# Patient Record
Sex: Male | Born: 1955 | Race: Black or African American | Hispanic: No | Marital: Single | Smoking: Former smoker
Health system: Southern US, Community
[De-identification: ages and names within clinical notes are randomized; demographics above are authoritative.]

## PROBLEM LIST (undated history)

## (undated) DIAGNOSIS — E119 Type 2 diabetes mellitus without complications: Secondary | ICD-10-CM

## (undated) DIAGNOSIS — I10 Essential (primary) hypertension: Secondary | ICD-10-CM

## (undated) DIAGNOSIS — Z930 Tracheostomy status: Secondary | ICD-10-CM

## (undated) DIAGNOSIS — H409 Unspecified glaucoma: Secondary | ICD-10-CM

## (undated) DIAGNOSIS — E785 Hyperlipidemia, unspecified: Secondary | ICD-10-CM

## (undated) HISTORY — DX: Unspecified glaucoma: H40.9

## (undated) HISTORY — PX: FOOT SURGERY: SHX648

## (undated) HISTORY — PX: EYE SURGERY: SHX253

## (undated) HISTORY — PX: TRACHEOSTOMY: SUR1362

## (undated) HISTORY — DX: Hyperlipidemia, unspecified: E78.5

## (undated) HISTORY — PX: HAND SURGERY: SHX662

---

## 2007-02-11 ENCOUNTER — Ambulatory Visit (HOSPITAL_COMMUNITY): Admission: RE | Admit: 2007-02-11 | Discharge: 2007-02-14 | Payer: Self-pay | Admitting: Cardiology

## 2007-10-05 ENCOUNTER — Emergency Department (HOSPITAL_COMMUNITY): Admission: EM | Admit: 2007-10-05 | Discharge: 2007-10-05 | Payer: Self-pay | Admitting: Emergency Medicine

## 2009-07-16 ENCOUNTER — Emergency Department (HOSPITAL_COMMUNITY): Admission: EM | Admit: 2009-07-16 | Discharge: 2009-07-16 | Payer: Self-pay | Admitting: Emergency Medicine

## 2010-03-30 LAB — GLUCOSE, CAPILLARY: Glucose-Capillary: 158 mg/dL — ABNORMAL HIGH (ref 70–99)

## 2011-02-24 ENCOUNTER — Encounter (INDEPENDENT_AMBULATORY_CARE_PROVIDER_SITE_OTHER): Payer: Self-pay | Admitting: Ophthalmology

## 2011-02-25 ENCOUNTER — Encounter (INDEPENDENT_AMBULATORY_CARE_PROVIDER_SITE_OTHER): Payer: 59 | Admitting: Ophthalmology

## 2011-02-25 DIAGNOSIS — E11319 Type 2 diabetes mellitus with unspecified diabetic retinopathy without macular edema: Secondary | ICD-10-CM

## 2011-02-25 DIAGNOSIS — I10 Essential (primary) hypertension: Secondary | ICD-10-CM

## 2011-02-25 DIAGNOSIS — H35039 Hypertensive retinopathy, unspecified eye: Secondary | ICD-10-CM

## 2011-02-25 DIAGNOSIS — E1139 Type 2 diabetes mellitus with other diabetic ophthalmic complication: Secondary | ICD-10-CM

## 2011-02-25 DIAGNOSIS — H3581 Retinal edema: Secondary | ICD-10-CM

## 2011-02-25 DIAGNOSIS — H43819 Vitreous degeneration, unspecified eye: Secondary | ICD-10-CM

## 2011-02-25 DIAGNOSIS — E1165 Type 2 diabetes mellitus with hyperglycemia: Secondary | ICD-10-CM

## 2011-03-05 ENCOUNTER — Other Ambulatory Visit (INDEPENDENT_AMBULATORY_CARE_PROVIDER_SITE_OTHER): Payer: 59 | Admitting: Ophthalmology

## 2011-03-05 DIAGNOSIS — H3581 Retinal edema: Secondary | ICD-10-CM

## 2011-04-10 ENCOUNTER — Other Ambulatory Visit: Payer: Self-pay | Admitting: Cardiology

## 2011-07-03 ENCOUNTER — Ambulatory Visit (INDEPENDENT_AMBULATORY_CARE_PROVIDER_SITE_OTHER): Payer: 59 | Admitting: Ophthalmology

## 2012-01-15 ENCOUNTER — Encounter (INDEPENDENT_AMBULATORY_CARE_PROVIDER_SITE_OTHER): Payer: 59 | Admitting: Ophthalmology

## 2012-01-26 ENCOUNTER — Encounter (INDEPENDENT_AMBULATORY_CARE_PROVIDER_SITE_OTHER): Payer: BC Managed Care – PPO | Admitting: Ophthalmology

## 2012-01-26 DIAGNOSIS — E11319 Type 2 diabetes mellitus with unspecified diabetic retinopathy without macular edema: Secondary | ICD-10-CM

## 2012-01-26 DIAGNOSIS — H35039 Hypertensive retinopathy, unspecified eye: Secondary | ICD-10-CM

## 2012-01-26 DIAGNOSIS — H4010X0 Unspecified open-angle glaucoma, stage unspecified: Secondary | ICD-10-CM

## 2012-01-26 DIAGNOSIS — I10 Essential (primary) hypertension: Secondary | ICD-10-CM

## 2012-01-26 DIAGNOSIS — H43819 Vitreous degeneration, unspecified eye: Secondary | ICD-10-CM

## 2012-01-26 DIAGNOSIS — E1139 Type 2 diabetes mellitus with other diabetic ophthalmic complication: Secondary | ICD-10-CM

## 2012-01-26 DIAGNOSIS — E1165 Type 2 diabetes mellitus with hyperglycemia: Secondary | ICD-10-CM

## 2012-04-15 ENCOUNTER — Other Ambulatory Visit (HOSPITAL_COMMUNITY): Payer: Self-pay | Admitting: Cardiology

## 2012-04-15 DIAGNOSIS — I63239 Cerebral infarction due to unspecified occlusion or stenosis of unspecified carotid arteries: Secondary | ICD-10-CM

## 2012-04-22 ENCOUNTER — Ambulatory Visit (HOSPITAL_COMMUNITY)
Admission: RE | Admit: 2012-04-22 | Discharge: 2012-04-22 | Disposition: A | Discharge status: Home / Self Care | Payer: BC Managed Care – PPO | Source: Ambulatory Visit | Attending: Cardiology | Admitting: Cardiology

## 2012-04-22 DIAGNOSIS — H3500 Unspecified background retinopathy: Secondary | ICD-10-CM | POA: Insufficient documentation

## 2012-04-22 DIAGNOSIS — I6529 Occlusion and stenosis of unspecified carotid artery: Secondary | ICD-10-CM

## 2012-04-22 DIAGNOSIS — I63239 Cerebral infarction due to unspecified occlusion or stenosis of unspecified carotid arteries: Secondary | ICD-10-CM

## 2012-04-22 NOTE — Progress Notes (Signed)
*  PRELIMINARY RESULTS* Vascular Ultrasound Carotid Duplex (Doppler) has been completed.  Preliminary findings: Bilaterally no significant ICA stenosis. Minimal plaque noted. Antegrade vertebral flow.   Farrel Demark, RDMS, RVT  04/22/2012, 10:55 AM

## 2012-07-25 ENCOUNTER — Ambulatory Visit (INDEPENDENT_AMBULATORY_CARE_PROVIDER_SITE_OTHER): Payer: BC Managed Care – PPO | Admitting: Ophthalmology

## 2012-07-29 ENCOUNTER — Ambulatory Visit (INDEPENDENT_AMBULATORY_CARE_PROVIDER_SITE_OTHER): Payer: Self-pay | Admitting: Ophthalmology

## 2012-08-01 ENCOUNTER — Ambulatory Visit (INDEPENDENT_AMBULATORY_CARE_PROVIDER_SITE_OTHER): Payer: BC Managed Care – PPO | Admitting: Ophthalmology

## 2012-08-01 DIAGNOSIS — E1165 Type 2 diabetes mellitus with hyperglycemia: Secondary | ICD-10-CM

## 2012-08-01 DIAGNOSIS — H43819 Vitreous degeneration, unspecified eye: Secondary | ICD-10-CM

## 2012-08-01 DIAGNOSIS — H35039 Hypertensive retinopathy, unspecified eye: Secondary | ICD-10-CM

## 2012-08-01 DIAGNOSIS — E11319 Type 2 diabetes mellitus with unspecified diabetic retinopathy without macular edema: Secondary | ICD-10-CM

## 2012-08-01 DIAGNOSIS — H2510 Age-related nuclear cataract, unspecified eye: Secondary | ICD-10-CM

## 2012-08-01 DIAGNOSIS — E1139 Type 2 diabetes mellitus with other diabetic ophthalmic complication: Secondary | ICD-10-CM

## 2012-08-01 DIAGNOSIS — I10 Essential (primary) hypertension: Secondary | ICD-10-CM

## 2012-10-03 ENCOUNTER — Emergency Department (HOSPITAL_COMMUNITY): Payer: BC Managed Care – PPO

## 2012-10-03 ENCOUNTER — Encounter (HOSPITAL_COMMUNITY): Payer: Self-pay | Admitting: *Deleted

## 2012-10-03 ENCOUNTER — Encounter (INDEPENDENT_AMBULATORY_CARE_PROVIDER_SITE_OTHER): Payer: BC Managed Care – PPO | Admitting: Ophthalmology

## 2012-10-03 ENCOUNTER — Emergency Department (HOSPITAL_COMMUNITY)
Admission: EM | Admit: 2012-10-03 | Discharge: 2012-10-03 | Disposition: A | Discharge status: Home / Self Care | Payer: BC Managed Care – PPO | Attending: Emergency Medicine | Admitting: Emergency Medicine

## 2012-10-03 DIAGNOSIS — Y990 Civilian activity done for income or pay: Secondary | ICD-10-CM | POA: Insufficient documentation

## 2012-10-03 DIAGNOSIS — I10 Essential (primary) hypertension: Secondary | ICD-10-CM | POA: Insufficient documentation

## 2012-10-03 DIAGNOSIS — Y929 Unspecified place or not applicable: Secondary | ICD-10-CM | POA: Insufficient documentation

## 2012-10-03 DIAGNOSIS — Z87891 Personal history of nicotine dependence: Secondary | ICD-10-CM | POA: Insufficient documentation

## 2012-10-03 DIAGNOSIS — W010XXA Fall on same level from slipping, tripping and stumbling without subsequent striking against object, initial encounter: Secondary | ICD-10-CM | POA: Insufficient documentation

## 2012-10-03 DIAGNOSIS — S4980XA Other specified injuries of shoulder and upper arm, unspecified arm, initial encounter: Secondary | ICD-10-CM | POA: Insufficient documentation

## 2012-10-03 DIAGNOSIS — S46909A Unspecified injury of unspecified muscle, fascia and tendon at shoulder and upper arm level, unspecified arm, initial encounter: Secondary | ICD-10-CM | POA: Insufficient documentation

## 2012-10-03 DIAGNOSIS — S0003XA Contusion of scalp, initial encounter: Secondary | ICD-10-CM | POA: Insufficient documentation

## 2012-10-03 DIAGNOSIS — Z79899 Other long term (current) drug therapy: Secondary | ICD-10-CM | POA: Insufficient documentation

## 2012-10-03 DIAGNOSIS — E119 Type 2 diabetes mellitus without complications: Secondary | ICD-10-CM | POA: Insufficient documentation

## 2012-10-03 DIAGNOSIS — Y939 Activity, unspecified: Secondary | ICD-10-CM | POA: Insufficient documentation

## 2012-10-03 DIAGNOSIS — Z23 Encounter for immunization: Secondary | ICD-10-CM | POA: Insufficient documentation

## 2012-10-03 HISTORY — DX: Type 2 diabetes mellitus without complications: E11.9

## 2012-10-03 HISTORY — DX: Essential (primary) hypertension: I10

## 2012-10-03 MED ORDER — TRAMADOL HCL 50 MG PO TABS: 50.0000 mg | ORAL_TABLET | Freq: Four times a day (QID) | ORAL | Status: DC | PRN

## 2012-10-03 MED ORDER — BACITRACIN ZINC 500 UNIT/GM EX OINT
TOPICAL_OINTMENT | Freq: Two times a day (BID) | CUTANEOUS | Status: DC
  Administered 2012-10-03: 1 via TOPICAL

## 2012-10-03 MED ORDER — TETANUS-DIPHTH-ACELL PERTUSSIS 5-2.5-18.5 LF-MCG/0.5 IM SUSP
0.5000 mL | Freq: Once | INTRAMUSCULAR | Status: AC
  Administered 2012-10-03: 0.5 mL via INTRAMUSCULAR
  Filled 2012-10-03: qty 0.5

## 2012-10-03 MED ORDER — HYDROCODONE-ACETAMINOPHEN 5-325 MG PO TABS
2.0000 | ORAL_TABLET | Freq: Once | ORAL | Status: AC
  Administered 2012-10-03: 2 via ORAL
  Filled 2012-10-03: qty 2

## 2012-10-03 NOTE — ED Notes (Signed)
Per EMS pt tripped and fell on concrete hitting his left arm. Per EMS pt c/o left arm pain and numbness. Pt is on LSB., VSS.

## 2012-10-03 NOTE — ED Notes (Signed)
Pt requesting to speak with MD prior to discharge, dr Denton Lank notified

## 2012-10-03 NOTE — ED Provider Notes (Addendum)
CSN: [id number]     Arrival date & time 10/03/12  1142 History   First MD Initiated Contact with Patient 10/03/12 1212     Chief Complaint  Patient presents with  . Fall  . Arm Pain   (Consider location/radiation/quality/duration/timing/severity/associated sxs/prior Treatment) Patient is a 57 y.o. male presenting with fall and arm pain. The history is provided by the patient.  Fall Pertinent negatives include no chest pain, no abdominal pain and no shortness of breath.  Arm Pain Pertinent negatives include no chest pain, no abdominal pain and no shortness of breath.  pt indicates trip and fall at work just pta today. States his work boots caught on edge metal grate, fell forward. C/o contusion head/forehead. Was dazed, denies loc. Dull frontal headache post fall.  Denies any faintness, dizziness or headache pre-fall. Also c/o left shoulder pain, constant, dull, moderate. States had brief tingly feeling to left shoulder/upper arm. No loss of sensation or weakness. Denies neck or back pain. Denies other pain or injury. States prior to fall, recent health at baseline, was otherwise feeling normal/well prior to fall.     Past Medical History  Diagnosis Date  . Hypertension   . Diabetes mellitus without complication    Past Surgical History  Procedure Laterality Date  . Eye surgery     History reviewed. No pertinent family history. History  Substance Use Topics  . Smoking status: Former Smoker    Quit date: 01/13/2012  . Smokeless tobacco: Not on file  . Alcohol Use: Yes    Review of Systems  Constitutional: Negative for fever.  HENT: Negative for neck pain.   Eyes: Negative for pain and visual disturbance.  Respiratory: Negative for shortness of breath.   Cardiovascular: Negative for chest pain.  Gastrointestinal: Negative for vomiting and abdominal pain.  Genitourinary: Negative for flank pain.  Musculoskeletal: Negative for back pain.  Skin: Negative for rash.   Neurological: Negative for weakness.  Hematological: Does not bruise/bleed easily.  Psychiatric/Behavioral: Negative for confusion.    Allergies  Review of patient's allergies indicates no known allergies.  Home Medications   Current Outpatient Rx  Name  Route  Sig  Dispense  Refill  . brinzolamide (AZOPT) 1 % ophthalmic suspension   Both Eyes   Place 1 drop into both eyes 3 (three) times daily.         . furosemide (LASIX) 40 MG tablet   Oral   Take 40 mg by mouth daily as needed for fluid.         Marland Kitchen losartan (COZAAR) 100 MG tablet   Oral   Take 100 mg by mouth every morning.         . meloxicam (MOBIC) 15 MG tablet   Oral   Take 15 mg by mouth daily as needed for pain.         . metFORMIN (GLUCOPHAGE) 1000 MG tablet   Oral   Take 1,000 mg by mouth 2 (two) times daily with a meal.         . metoprolol succinate (TOPROL-XL) 50 MG 24 hr tablet   Oral   Take 50 mg by mouth every morning. Take with or immediately following a meal.         . Multiple Vitamin (MULTIVITAMIN WITH MINERALS) TABS tablet   Oral   Take 1 tablet by mouth every morning.         . potassium chloride SA (K-DUR,KLOR-CON) 20 MEQ tablet   Oral   Take  20 mEq by mouth daily as needed (For potassium loss.).         Marland Kitchen travoprost, benzalkonium, (TRAVATAN) 0.004 % ophthalmic solution   Both Eyes   Place 1 drop into both eyes at bedtime.          There were no vitals taken for this visit. Physical Exam  Nursing note and vitals reviewed. Constitutional: He is oriented to person, place, and time. He appears well-developed and well-nourished. No distress.  HENT:  Right Ear: External ear normal.  Large contusion to forehead. Abrasion to chin. No malocclusion.  Eyes: Conjunctivae are normal. Pupils are equal, round, and reactive to light.  Neck: Normal range of motion. Neck supple. No tracheal deviation present.  Cardiovascular: Normal rate, regular rhythm, normal heart sounds and  intact distal pulses.   Pulmonary/Chest: Effort normal and breath sounds normal. No accessory muscle usage. No respiratory distress. He exhibits no tenderness.  Abdominal: He exhibits no distension. There is no tenderness.  Obese. No abd wall contusion, bruising or tenderness  Musculoskeletal: Normal range of motion. He exhibits no edema.  Tenderness left shoulder. Good rom bil ext without other pain or focal bony tenderness. Distal pulses palp bil.  Mid cervical tenderness, otherwise CTLS spine, non tender, aligned, no step off.   Neurological: He is alert and oriented to person, place, and time.  Motor intact bil, 5/5. sens intact. Pt ambulates w steady gait.   Skin: Skin is warm and dry. He is not diaphoretic.  Psychiatric: He has a normal mood and affect.    ED Course  Procedures (including critical care time)    Results for orders placed during the hospital encounter of 07/16/09  GLUCOSE, CAPILLARY      Result Value Range   Glucose-Capillary 158 (*) 70 - 99 mg/dL   Comment 1 Documented in Chart     Ct Head Wo Contrast  10/03/2012   CLINICAL DATA:  57 year old male status post fall with pain.  EXAM: CT HEAD WITHOUT CONTRAST  CT CERVICAL SPINE WITHOUT CONTRAST  TECHNIQUE: Multidetector CT imaging of the head and cervical spine was performed following the standard protocol without intravenous contrast. Multiplanar CT image reconstructions of the cervical spine were also generated.  COMPARISON:  None.  FINDINGS: CT HEAD FINDINGS  Minimal paranasal sinus mucosal thickening. Tympanic cavities and mastoids are clear.  Mild forehead soft tissue stranding could represent posttraumatic contusion. Visualized orbit soft tissues are within normal limits. Frontal bones and calvarium intact.  Mild Calcified atherosclerosis at the skull base. Suboccipital scalp soft tissue scarring suspected.  No midline shift, ventriculomegaly, mass effect, evidence of mass lesion, intracranial hemorrhage or evidence  of cortically based acute infarction. Gray-white matter differentiation is within normal limits throughout the brain. No suspicious intracranial vascular hyperdensity.  CT CERVICAL SPINE FINDINGS  Visualized skull base is intact. No atlanto-occipital dissociation. Cervicothoracic junction alignment is within normal limits. Bilateral posterior element alignment is within normal limits. No acute cervical spine fracture identified. Grossly intact visualized upper thoracic levels. Negative lung apices.  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 Multilevel advanced disc and endplate degeneration. Cervical spinal stenosis elsewhere is less severe. Advanced upper thoracic facet degeneration.  Thyromegaly. Partially visualized thyroid with transverse dimension of both lobes collectively up to 9.1 cm.  Other visualized paraspinal soft tissues are within normal limits. No significant mass effect on the visible airway.  IMPRESSION: CT HEAD IMPRESSION  Possible scalp soft  tissue injury. No underlying skull fracture. Normal noncontrast CT appearance of the brain.  CT CERVICAL SPINE IMPRESSION  1. No acute fracture or listhesis identified in the cervical spine. Ligamentous injury is not excluded.  2. 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  3.  Marked thyromegaly, incompletely visible.   Electronically Signed   By: Augusto Gamble M.D.   On: 10/03/2012 12:57   Ct Cervical Spine Wo Contrast  10/03/2012   CLINICAL DATA:  57 year old male status post fall with pain.  EXAM: CT HEAD WITHOUT CONTRAST  CT CERVICAL SPINE WITHOUT CONTRAST  TECHNIQUE: Multidetector CT imaging of the head and cervical spine was performed following the standard protocol without intravenous contrast. Multiplanar CT image reconstructions of the cervical spine were also generated.  COMPARISON:  None.   FINDINGS: CT HEAD FINDINGS  Minimal paranasal sinus mucosal thickening. Tympanic cavities and mastoids are clear.  Mild forehead soft tissue stranding could represent posttraumatic contusion. Visualized orbit soft tissues are within normal limits. Frontal bones and calvarium intact.  Mild Calcified atherosclerosis at the skull base. Suboccipital scalp soft tissue scarring suspected.  No midline shift, ventriculomegaly, mass effect, evidence of mass lesion, intracranial hemorrhage or evidence of cortically based acute infarction. Gray-white matter differentiation is within normal limits throughout the brain. No suspicious intracranial vascular hyperdensity.  CT CERVICAL SPINE FINDINGS  Visualized skull base is intact. No atlanto-occipital dissociation. Cervicothoracic junction alignment is within normal limits. Bilateral posterior element alignment is within normal limits. No acute cervical spine fracture identified. Grossly intact visualized upper thoracic levels. Negative lung apices.  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 Multilevel advanced disc and endplate degeneration. Cervical spinal stenosis elsewhere is less severe. Advanced upper thoracic facet degeneration.  Thyromegaly. Partially visualized thyroid with transverse dimension of both lobes collectively up to 9.1 cm.  Other visualized paraspinal soft tissues are within normal limits. No significant mass effect on the visible airway.  IMPRESSION: CT HEAD IMPRESSION  Possible scalp soft tissue injury. No underlying skull fracture. Normal noncontrast CT appearance of the brain.  CT CERVICAL SPINE IMPRESSION  1. No acute fracture or listhesis identified in the cervical spine. Ligamentous injury is not excluded.  2. 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  3.  Marked thyromegaly, incompletely visible.    Electronically Signed   By: Augusto Gamble M.D.   On: 10/03/2012 12:57   Dg Shoulder Left  10/03/2012   CLINICAL DATA:  Fall with left shoulder pain.  EXAM: LEFT SHOULDER - 2+ VIEW  COMPARISON:  None.  FINDINGS: Mild degenerative changes of the acromioclavicular and glenohumeral joints. No acute fracture or dislocation. Visualized portions of the left hemi thorax are within normal limits.  IMPRESSION: No acute osseous abnormality.   Electronically Signed   By: Jeronimo Greaves   On: 10/03/2012 13:18      MDM  Xr/ct.  Reviewed nursing notes and prior charts for additional history.   vicodin 1 po.    Abrasions cleaned, bacitracin.  Tet im (pt unclear on when last tetanus).  Recheck spine nt. abd soft nt.   No numbness/weakness. No other/new c/o of pain, pain controlled.   Discussed xr/ct w pt incl thyromegaly, spinal stenosis, deg changes, and need for close primary care follow up.  Hx c/w mechanical fall. Vitals normal. Pain controlled. Vitals normal.  Pt appears stable for d/c.      Suzi Roots, MD 10/03/12 [phone]

## 2012-10-03 NOTE — ED Notes (Signed)
Bed: WA20 Expected date:  Expected time:  Means of arrival:  Comments: EMS - Fall **rm 20

## 2012-10-07 ENCOUNTER — Encounter (HOSPITAL_COMMUNITY): Payer: Self-pay | Admitting: *Deleted

## 2012-10-07 ENCOUNTER — Inpatient Hospital Stay (HOSPITAL_COMMUNITY): Payer: BC Managed Care – PPO

## 2012-10-07 ENCOUNTER — Inpatient Hospital Stay: Admit: 2012-10-07 | Payer: Self-pay | Admitting: Neurosurgery

## 2012-10-07 ENCOUNTER — Emergency Department (HOSPITAL_COMMUNITY): Payer: BC Managed Care – PPO

## 2012-10-07 ENCOUNTER — Encounter (HOSPITAL_COMMUNITY)
Admission: EM | Disposition: A | Discharge status: Rehabilitation Facility | Payer: Self-pay | Source: Home / Self Care | Attending: Neurosurgery

## 2012-10-07 ENCOUNTER — Encounter (HOSPITAL_COMMUNITY): Payer: Self-pay | Admitting: Anesthesiology

## 2012-10-07 ENCOUNTER — Inpatient Hospital Stay (HOSPITAL_COMMUNITY)
Admission: EM | Admit: 2012-10-07 | Discharge: 2012-10-13 | DRG: 836 | Disposition: A | Discharge status: Rehabilitation Facility | Payer: BC Managed Care – PPO | Attending: Neurosurgery | Admitting: Neurosurgery

## 2012-10-07 ENCOUNTER — Inpatient Hospital Stay (HOSPITAL_COMMUNITY): Payer: BC Managed Care – PPO | Admitting: Anesthesiology

## 2012-10-07 DIAGNOSIS — E119 Type 2 diabetes mellitus without complications: Secondary | ICD-10-CM | POA: Diagnosis present

## 2012-10-07 DIAGNOSIS — Z87891 Personal history of nicotine dependence: Secondary | ICD-10-CM

## 2012-10-07 DIAGNOSIS — W19XXXA Unspecified fall, initial encounter: Secondary | ICD-10-CM | POA: Diagnosis present

## 2012-10-07 DIAGNOSIS — E049 Nontoxic goiter, unspecified: Secondary | ICD-10-CM | POA: Diagnosis present

## 2012-10-07 DIAGNOSIS — Z79899 Other long term (current) drug therapy: Secondary | ICD-10-CM

## 2012-10-07 DIAGNOSIS — R32 Unspecified urinary incontinence: Secondary | ICD-10-CM | POA: Diagnosis present

## 2012-10-07 DIAGNOSIS — G9520 Unspecified cord compression: Secondary | ICD-10-CM

## 2012-10-07 DIAGNOSIS — I10 Essential (primary) hypertension: Secondary | ICD-10-CM | POA: Diagnosis present

## 2012-10-07 DIAGNOSIS — Z6841 Body Mass Index (BMI) 40.0 and over, adult: Secondary | ICD-10-CM

## 2012-10-07 DIAGNOSIS — M4712 Other spondylosis with myelopathy, cervical region: Secondary | ICD-10-CM | POA: Diagnosis present

## 2012-10-07 DIAGNOSIS — S14121A Central cord syndrome at C1 level of cervical spinal cord, initial encounter: Principal | ICD-10-CM | POA: Diagnosis present

## 2012-10-07 DIAGNOSIS — S14125A Central cord syndrome at C5 level of cervical spinal cord, initial encounter: Secondary | ICD-10-CM | POA: Diagnosis present

## 2012-10-07 DIAGNOSIS — J4489 Other specified chronic obstructive pulmonary disease: Secondary | ICD-10-CM | POA: Diagnosis present

## 2012-10-07 DIAGNOSIS — G609 Hereditary and idiopathic neuropathy, unspecified: Secondary | ICD-10-CM | POA: Diagnosis present

## 2012-10-07 HISTORY — PX: ANTERIOR CERVICAL DECOMP/DISCECTOMY FUSION: SHX1161

## 2012-10-07 LAB — CBC WITH DIFFERENTIAL/PLATELET
Basophils Absolute: 0 10*3/uL (ref 0.0–0.1)
Eosinophils Absolute: 0.1 10*3/uL (ref 0.0–0.7)
Eosinophils Relative: 2 % (ref 0–5)
MCH: 30 pg (ref 26.0–34.0)
MCHC: 32.2 g/dL (ref 30.0–36.0)
MCV: 93.4 fL (ref 78.0–100.0)
Platelets: 204 10*3/uL (ref 150–400)
RDW: 13.1 % (ref 11.5–15.5)

## 2012-10-07 LAB — BASIC METABOLIC PANEL
Calcium: 9.7 mg/dL (ref 8.4–10.5)
GFR calc non Af Amer: >90 mL/min (ref >90)
Sodium: 138 mEq/L (ref 135–145)

## 2012-10-07 LAB — GLUCOSE, CAPILLARY: Glucose-Capillary: 128 mg/dL — ABNORMAL HIGH (ref 70–99)

## 2012-10-07 SURGERY — ANTERIOR CERVICAL DECOMPRESSION/DISCECTOMY FUSION 2 LEVELS
Anesthesia: General | Site: Neck | Wound class: Clean

## 2012-10-07 MED ORDER — ONDANSETRON HCL 4 MG/2ML IJ SOLN
INTRAMUSCULAR | Status: DC | PRN
  Administered 2012-10-07: 4 mg via INTRAVENOUS

## 2012-10-07 MED ORDER — DEXAMETHASONE SODIUM PHOSPHATE 4 MG/ML IJ SOLN
INTRAMUSCULAR | Status: DC | PRN
  Administered 2012-10-07: 4 mg via INTRAVENOUS

## 2012-10-07 MED ORDER — FUROSEMIDE 40 MG PO TABS
40.0000 mg | ORAL_TABLET | Freq: Every day | ORAL | Status: DC | PRN
  Filled 2012-10-07: qty 1

## 2012-10-07 MED ORDER — HYDROMORPHONE HCL PF 1 MG/ML IJ SOLN
0.2500 mg | INTRAMUSCULAR | Status: DC | PRN
  Administered 2012-10-07 (×2): 0.5 mg via INTRAVENOUS

## 2012-10-07 MED ORDER — ARTIFICIAL TEARS OP OINT
TOPICAL_OINTMENT | OPHTHALMIC | Status: DC | PRN
  Administered 2012-10-07: 1 via OPHTHALMIC

## 2012-10-07 MED ORDER — INSULIN ASPART 100 UNIT/ML ~~LOC~~ SOLN: 0.0000 [IU] | Freq: Every day | SUBCUTANEOUS | Status: DC

## 2012-10-07 MED ORDER — OXYCODONE HCL 5 MG PO TABS: 5.0000 mg | ORAL_TABLET | Freq: Once | ORAL | Status: DC | PRN

## 2012-10-07 MED ORDER — GLYCOPYRROLATE 0.2 MG/ML IJ SOLN
INTRAMUSCULAR | Status: DC | PRN
  Administered 2012-10-07: 0.4 mg via INTRAVENOUS
  Administered 2012-10-07: 0.2 mg via INTRAVENOUS

## 2012-10-07 MED ORDER — ONDANSETRON HCL 4 MG/2ML IJ SOLN: 4.0000 mg | Freq: Once | INTRAMUSCULAR | Status: DC | PRN

## 2012-10-07 MED ORDER — LACTATED RINGERS IV SOLN
INTRAVENOUS | Status: DC
  Administered 2012-10-07 (×2): via INTRAVENOUS

## 2012-10-07 MED ORDER — OXYCODONE HCL 5 MG/5ML PO SOLN: 5.0000 mg | Freq: Once | ORAL | Status: AC | PRN

## 2012-10-07 MED ORDER — MENTHOL 3 MG MT LOZG: 1.0000 | LOZENGE | OROMUCOSAL | Status: DC | PRN

## 2012-10-07 MED ORDER — PHENOL 1.4 % MT LIQD
1.0000 | OROMUCOSAL | Status: DC | PRN
  Administered 2012-10-08 – 2012-10-12 (×2): 1 via OROMUCOSAL
  Filled 2012-10-07: qty 177

## 2012-10-07 MED ORDER — METFORMIN HCL 500 MG PO TABS
1000.0000 mg | ORAL_TABLET | Freq: Two times a day (BID) | ORAL | Status: DC
  Administered 2012-10-08 – 2012-10-13 (×11): 1000 mg via ORAL
  Filled 2012-10-07 (×13): qty 2

## 2012-10-07 MED ORDER — PROMETHAZINE HCL 25 MG/ML IJ SOLN: 6.2500 mg | INTRAMUSCULAR | Status: DC | PRN

## 2012-10-07 MED ORDER — OXYCODONE HCL 5 MG PO TABS
ORAL_TABLET | ORAL | Status: AC
  Filled 2012-10-07: qty 1

## 2012-10-07 MED ORDER — HEMOSTATIC AGENTS (NO CHARGE) OPTIME
TOPICAL | Status: DC | PRN
  Administered 2012-10-07: 1 via TOPICAL

## 2012-10-07 MED ORDER — MEPERIDINE HCL 25 MG/ML IJ SOLN: 6.2500 mg | INTRAMUSCULAR | Status: DC | PRN

## 2012-10-07 MED ORDER — ESMOLOL HCL 10 MG/ML IV SOLN
INTRAVENOUS | Status: DC | PRN
  Administered 2012-10-07: 10 mg via INTRAVENOUS

## 2012-10-07 MED ORDER — LOSARTAN POTASSIUM 50 MG PO TABS
100.0000 mg | ORAL_TABLET | Freq: Every morning | ORAL | Status: DC
  Administered 2012-10-08 – 2012-10-13 (×5): 100 mg via ORAL
  Filled 2012-10-07 (×6): qty 2

## 2012-10-07 MED ORDER — SUFENTANIL CITRATE 50 MCG/ML IV SOLN
INTRAVENOUS | Status: DC | PRN
  Administered 2012-10-07: 10 ug via INTRAVENOUS
  Administered 2012-10-07 (×2): 20 ug via INTRAVENOUS

## 2012-10-07 MED ORDER — DEXTROSE 5 % IV SOLN
3.0000 g | INTRAVENOUS | Status: AC
  Administered 2012-10-07: 3 g via INTRAVENOUS
  Filled 2012-10-07 (×2): qty 3000

## 2012-10-07 MED ORDER — MIDAZOLAM HCL 5 MG/5ML IJ SOLN
INTRAMUSCULAR | Status: DC | PRN
  Administered 2012-10-07: 2 mg via INTRAVENOUS

## 2012-10-07 MED ORDER — HYDROMORPHONE HCL PF 1 MG/ML IJ SOLN: 0.2500 mg | INTRAMUSCULAR | Status: DC | PRN

## 2012-10-07 MED ORDER — INSULIN ASPART 100 UNIT/ML ~~LOC~~ SOLN
0.0000 [IU] | Freq: Three times a day (TID) | SUBCUTANEOUS | Status: DC
  Administered 2012-10-08: 4 [IU] via SUBCUTANEOUS
  Administered 2012-10-08 (×2): 3 [IU] via SUBCUTANEOUS
  Administered 2012-10-09: 4 [IU] via SUBCUTANEOUS
  Administered 2012-10-09 (×2): 3 [IU] via SUBCUTANEOUS
  Administered 2012-10-10: 4 [IU] via SUBCUTANEOUS
  Administered 2012-10-10: 3 [IU] via SUBCUTANEOUS
  Administered 2012-10-10: 4 [IU] via SUBCUTANEOUS
  Administered 2012-10-11 (×2): 3 [IU] via SUBCUTANEOUS
  Administered 2012-10-11: 4 [IU] via SUBCUTANEOUS
  Administered 2012-10-12: 7 [IU] via SUBCUTANEOUS
  Administered 2012-10-12: 4 [IU] via SUBCUTANEOUS
  Administered 2012-10-12: 3 [IU] via SUBCUTANEOUS
  Administered 2012-10-13 (×2): 4 [IU] via SUBCUTANEOUS

## 2012-10-07 MED ORDER — PROPOFOL 10 MG/ML IV BOLUS
INTRAVENOUS | Status: DC | PRN
  Administered 2012-10-07: 250 mg via INTRAVENOUS
  Administered 2012-10-07: 50 mg via INTRAVENOUS

## 2012-10-07 MED ORDER — ROCURONIUM BROMIDE 100 MG/10ML IV SOLN
INTRAVENOUS | Status: DC | PRN
  Administered 2012-10-07 (×2): 20 mg via INTRAVENOUS
  Administered 2012-10-07: 30 mg via INTRAVENOUS

## 2012-10-07 MED ORDER — MIDAZOLAM HCL 2 MG/2ML IJ SOLN: 0.5000 mg | Freq: Once | INTRAMUSCULAR | Status: DC | PRN

## 2012-10-07 MED ORDER — SUCCINYLCHOLINE CHLORIDE 20 MG/ML IJ SOLN
INTRAMUSCULAR | Status: DC | PRN
  Administered 2012-10-07: 200 mg via INTRAVENOUS

## 2012-10-07 MED ORDER — 0.9 % SODIUM CHLORIDE (POUR BTL) OPTIME
TOPICAL | Status: DC | PRN
  Administered 2012-10-07: 1000 mL

## 2012-10-07 MED ORDER — METOPROLOL SUCCINATE ER 50 MG PO TB24
50.0000 mg | ORAL_TABLET | Freq: Every morning | ORAL | Status: DC
  Administered 2012-10-08 – 2012-10-13 (×5): 50 mg via ORAL
  Filled 2012-10-07 (×6): qty 1

## 2012-10-07 MED ORDER — OXYCODONE HCL 5 MG/5ML PO SOLN: 5.0000 mg | Freq: Once | ORAL | Status: DC | PRN

## 2012-10-07 MED ORDER — LIDOCAINE HCL (CARDIAC) 20 MG/ML IV SOLN
INTRAVENOUS | Status: DC | PRN
  Administered 2012-10-07: 100 mg via INTRAVENOUS

## 2012-10-07 MED ORDER — HYDROMORPHONE HCL PF 1 MG/ML IJ SOLN
INTRAMUSCULAR | Status: AC
  Filled 2012-10-07: qty 1

## 2012-10-07 MED ORDER — POTASSIUM CHLORIDE CRYS ER 20 MEQ PO TBCR: 20.0000 meq | EXTENDED_RELEASE_TABLET | Freq: Every day | ORAL | Status: DC | PRN

## 2012-10-07 MED ORDER — NEOSTIGMINE METHYLSULFATE 1 MG/ML IJ SOLN
INTRAMUSCULAR | Status: DC | PRN
  Administered 2012-10-07: 3 mg via INTRAVENOUS

## 2012-10-07 MED ORDER — THROMBIN 5000 UNITS EX SOLR
CUTANEOUS | Status: DC | PRN
  Administered 2012-10-07 (×2): 5000 [IU] via TOPICAL

## 2012-10-07 MED ORDER — OXYCODONE HCL 5 MG PO TABS
5.0000 mg | ORAL_TABLET | Freq: Once | ORAL | Status: AC | PRN
  Administered 2012-10-07: 5 mg via ORAL

## 2012-10-07 SURGICAL SUPPLY — 52 items
APL SKNCLS STERI-STRIP NONHPOA (GAUZE/BANDAGES/DRESSINGS) ×1
BANDAGE GAUZE ELAST BULKY 4 IN (GAUZE/BANDAGES/DRESSINGS) ×4 IMPLANT
BENZOIN TINCTURE PRP APPL 2/3 (GAUZE/BANDAGES/DRESSINGS) ×2 IMPLANT
BIT DRILL SM SPINE QC 12 (BIT) ×2 IMPLANT
BRUSH SCRUB EZ PLAIN DRY (MISCELLANEOUS) ×2 IMPLANT
BUR MATCHSTICK NEURO 3.0X3.8 (BURR) ×4 IMPLANT
CANISTER SUCTION 2500CC (MISCELLANEOUS) ×2 IMPLANT
CLOTH BEACON ORANGE TIMEOUT ST (SAFETY) ×2 IMPLANT
CONT SPEC 4OZ CLIKSEAL STRL BL (MISCELLANEOUS) ×2 IMPLANT
CORDS BIPOLAR (ELECTRODE) IMPLANT
COVER MAYO STAND STRL (DRAPES) ×2 IMPLANT
DRAPE INCISE IOBAN 66X45 STRL (DRAPES) IMPLANT
DRAPE MICROSCOPE LEICA (MISCELLANEOUS) ×2 IMPLANT
DRAPE ORTHO SPLIT 77X108 STRL (DRAPES)
DRAPE PED LAPAROTOMY (DRAPES) IMPLANT
DRAPE POUCH INSTRU U-SHP 10X18 (DRAPES) ×2 IMPLANT
DRAPE PROXIMA HALF (DRAPES) ×2 IMPLANT
DRAPE SURG ORHT 6 SPLT 77X108 (DRAPES) IMPLANT
ELECT BLADE 4.0 EZ CLEAN MEGAD (MISCELLANEOUS) ×2
ELECT CAUTERY BLADE 6.4 (BLADE) IMPLANT
ELECT REM PT RETURN 9FT ADLT (ELECTROSURGICAL) ×2
ELECTRODE BLDE 4.0 EZ CLN MEGD (MISCELLANEOUS) ×1 IMPLANT
ELECTRODE REM PT RTRN 9FT ADLT (ELECTROSURGICAL) ×1 IMPLANT
GAUZE SPONGE 4X4 16PLY XRAY LF (GAUZE/BANDAGES/DRESSINGS) IMPLANT
GLOVE BIOGEL M 8.0 STRL (GLOVE) ×2 IMPLANT
GOWN STRL NON-REIN LRG LVL3 (GOWN DISPOSABLE) ×2 IMPLANT
HEAD HALTER (SOFTGOODS) IMPLANT
KIT BASIN OR (CUSTOM PROCEDURE TRAY) ×2 IMPLANT
KIT ROOM TURNOVER OR (KITS) ×2 IMPLANT
NEEDLE HYPO 18GX1.5 BLUNT FILL (NEEDLE) IMPLANT
NEEDLE SPNL 22GX3.5 QUINCKE BK (NEEDLE) ×2 IMPLANT
NS IRRIG 1000ML POUR BTL (IV SOLUTION) ×2 IMPLANT
PACK LAMINECTOMY NEURO (CUSTOM PROCEDURE TRAY) ×2 IMPLANT
PAD ARMBOARD 7.5X6 YLW CONV (MISCELLANEOUS) ×4 IMPLANT
PLATE ANT CERV XTEND 2 LV 30 (Plate) ×2 IMPLANT
PUTTY DBX 1CC (Putty) ×2 IMPLANT
PUTTY DBX 1CC DEPUY (Putty) ×1 IMPLANT
RUBBERBAND STERILE (MISCELLANEOUS) ×2 IMPLANT
SCREW XTD VAR 4.2 SELF TAP (Screw) ×2 IMPLANT
SCREW XTD VAR 4.2 SELF TAP 12 (Screw) ×10 IMPLANT
SPACER ACDF SM LORDOTIC 7 (Spacer) ×4 IMPLANT
SPONGE GAUZE 4X4 12PLY (GAUZE/BANDAGES/DRESSINGS) ×2 IMPLANT
SPONGE INTESTINAL PEANUT (DISPOSABLE) ×2 IMPLANT
SPONGE SURGIFOAM ABS GEL 100 (HEMOSTASIS) IMPLANT
SPONGE SURGIFOAM ABS GEL SZ50 (HEMOSTASIS) ×2 IMPLANT
STRIP CLOSURE SKIN 1/2X4 (GAUZE/BANDAGES/DRESSINGS) ×2 IMPLANT
SUT VIC AB 3-0 SH 8-18 (SUTURE) ×2 IMPLANT
SYR 20ML ECCENTRIC (SYRINGE) ×2 IMPLANT
TAPE CLOTH SURG 4X10 WHT LF (GAUZE/BANDAGES/DRESSINGS) ×2 IMPLANT
TOWEL OR 17X24 6PK STRL BLUE (TOWEL DISPOSABLE) ×2 IMPLANT
TOWEL OR 17X26 10 PK STRL BLUE (TOWEL DISPOSABLE) ×2 IMPLANT
WATER STERILE IRR 1000ML POUR (IV SOLUTION) ×2 IMPLANT

## 2012-10-07 NOTE — Anesthesia Preprocedure Evaluation (Addendum)
Anesthesia Evaluation  Patient identified by MRN, date of birth, ID band Patient awake    Reviewed: Allergy & Precautions, H&P , NPO status , Patient's Chart, lab work & pertinent test results  History of Anesthesia Complications Negative for: history of anesthetic complications  Airway Mallampati: III TM Distance: >3 FB Neck ROM: Full    Dental  (+) Dental Advisory Given, Teeth Intact and Poor Dentition   Pulmonary former smoker (quit 1/14),  breath sounds clear to auscultation  Pulmonary exam normal       Cardiovascular hypertension, Pt. on medications and Pt. on home beta blockers Rhythm:Regular Rate:Normal  '09 stress test: no ischemia, EF 63%   Neuro/Psych Cervical myelopathy    GI/Hepatic negative GI ROS, Neg liver ROS,   Endo/Other  diabetes (glu 104), Well Controlled, Type 2, Oral Hypoglycemic AgentsMorbid obesity  Renal/GU negative Renal ROS     Musculoskeletal   Abdominal (+) + obese,   Peds  Hematology   Anesthesia Other Findings   Reproductive/Obstetrics                        Anesthesia Physical Anesthesia Plan  ASA: III  Anesthesia Plan: General   Post-op Pain Management:    Induction: Intravenous  Airway Management Planned: Oral ETT  Additional Equipment:   Intra-op Plan:   Post-operative Plan: Extubation in OR  Informed Consent: I have reviewed the patients History and Physical, chart, labs and discussed the procedure including the risks, benefits and alternatives for the proposed anesthesia with the patient or authorized representative who has indicated his/her understanding and acceptance.   Dental advisory given  Plan Discussed with: CRNA, Surgeon and Anesthesiologist  Anesthesia Plan Comments:         Anesthesia Quick Evaluation

## 2012-10-07 NOTE — Progress Notes (Signed)
Op note [phone]

## 2012-10-07 NOTE — ED Notes (Signed)
Pt back from CT

## 2012-10-07 NOTE — ED Notes (Signed)
Pt is followed by dr. Sharolyn Douglas [phone]

## 2012-10-07 NOTE — H&P (Signed)
Dale Key is an 57 y.o. male.   Chief Complaint: weakness HPI: patient who fell 4 days ago and hit his head. Was seen at [facility] and released after a w/up. Since then he has been complaining of tingling anfd numbness in his hands and legs associted with weakness of both hans and legs having difficulties walking. [facility] had [facility]ri and send to [place]  Past Medical History  Diagnosis Date  . Hypertension   . Diabetes mellitus without complication     Past Surgical History  Procedure Laterality Date  . Eye surgery      No family history on file. Social History:  reports that he quit smoking about 8 months ago. He does not have any smokeless tobacco history on file. He reports that  drinks alcohol. His drug history is not on file.  Allergies: No Known Allergies  Medications Prior to Admission  Medication Sig Dispense Refill  . brinzolamide (AZOPT) 1 % ophthalmic suspension Place 1 drop into both eyes 3 (three) times daily.      . furosemide (LASIX) 40 MG tablet Take 40 mg by mouth daily as needed for fluid.      Marland Kitchen losartan (COZAAR) 100 MG tablet Take 100 mg by mouth every morning.      . meloxicam (MOBIC) 15 MG tablet Take 15 mg by mouth daily as needed for pain.      . metFORMIN (GLUCOPHAGE) 1000 MG tablet Take 1,000 mg by mouth 2 (two) times daily with a meal.      . metoprolol succinate (TOPROL-XL) 50 MG 24 hr tablet Take 50 mg by mouth every morning. Take with or immediately following a meal.      . Multiple Vitamin (MULTIVITAMIN WITH MINERALS) TABS tablet Take 1 tablet by mouth every morning.      . potassium chloride SA (K-DUR,KLOR-CON) 20 MEQ tablet Take 20 mEq by mouth daily as needed (For potassium loss.).      Marland Kitchen travoprost, benzalkonium, (TRAVATAN) 0.004 % ophthalmic solution Place 1 drop into both eyes at bedtime.      . traMADol (ULTRAM) 50 MG tablet Take 1 tablet (50 mg total) by mouth every 6 (six) hours as needed for pain.  20 tablet  0    Results for orders  placed during the hospital encounter of 10/07/12 (from the past 48 hour(s))  CBC WITH DIFFERENTIAL     Status: Abnormal   Collection Time    10/07/12  3:11 PM      Result Value Range   WBC 6.2  4.0 - 10.5 K/uL   RBC 4.23  4.22 - 5.81 MIL/uL   Hemoglobin 12.7 (*) 13.0 - 17.0 g/dL   HCT 40.9  81.1 - 91.4 %   MCV 93.4  78.0 - 100.0 fL   MCH 30.0  26.0 - 34.0 pg   MCHC 32.2  30.0 - 36.0 g/dL   RDW 78.2  95.6 - 21.3 %   Platelets 204  150 - 400 K/uL   Neutrophils Relative % 63  43 - 77 %   Neutro Abs 3.9  1.7 - 7.7 K/uL   Lymphocytes Relative 25  12 - 46 %   Lymphs Abs 1.6  0.7 - 4.0 K/uL   Monocytes Relative 10  3 - 12 %   Monocytes Absolute 0.6  0.1 - 1.0 K/uL   Eosinophils Relative 2  0 - 5 %   Eosinophils Absolute 0.1  0.0 - 0.7 K/uL  Basophils Relative 0  0 - 1 %   Basophils Absolute 0.0  0.0 - 0.1 K/uL  BASIC METABOLIC PANEL     Status: Abnormal   Collection Time    10/07/12  3:11 PM      Result Value Range   Sodium 138  135 - 145 mEq/L   Potassium 3.9  3.5 - 5.1 mEq/L   Chloride 100  96 - 112 mEq/L   CO2 27  19 - 32 mEq/L   Glucose, Bld 104 (*) 70 - 99 mg/dL   BUN 16  6 - 23 mg/dL   Creatinine, Ser 1.61  0.50 - 1.35 mg/dL   Calcium 9.7  8.4 - 09.6 mg/dL   GFR calc non Af Amer >90  >90 mL/min   GFR calc Af Amer >90  >90 mL/min   Comment: (NOTE)     The eGFR has been calculated using the CKD EPI equation.     This calculation has not been validated in all clinical situations.     eGFR's persistently <90 mL/min signify possible Chronic Kidney     Disease.   Dg Hip Complete Right  10/07/2012   CLINICAL DATA:  Fall several days ago. Right hip pain  EXAM: RIGHT HIP - COMPLETE 2+ VIEW  COMPARISON:  None.  FINDINGS: Degenerative change is present in both hip joints with joint space narrowing and spurring.  Negative for fracture or AVN of the right hip.  IMPRESSION: Degenerative change of both hip joints. Negative for fracture.   Electronically Signed   By: Marlan Palau M.D.    On: 10/07/2012 10:38   Mr Cervical Spine Wo Contrast  10/07/2012   *RADIOLOGY REPORT*  Clinical Data: Status post fall 10/03/2012.  Neck pain.  Bilateral upper extremity numbness has developed.  MRI CERVICAL SPINE WITHOUT CONTRAST  Technique:  Multiplanar and multiecho pulse sequences of the cervical spine, to include the craniocervical junction and cervicothoracic junction, were obtained according to standard protocol without intravenous contrast.  Comparison: CT cervical spine 10/03/2012.z  Findings: Reduced image quality due to body habitus and the some patient motion.   Congenital spinal stenosis redemonstrated with short pedicles. Mild reversal normal cervical lordotic curve.  No vertebral body compression fracture or traumatic subluxation. No epidural hematoma or hematomyelia.  The individual disc spaces were examined as follows:  C2-3:  Central bulging annular fibers.  Congenital and acquired stenosis with canal diameter 7 mm.  Effacement anterior subarachnoid space without frank cord compression.  C3-4:  Central disc extrusion. Congenital stenosis.  Significant cord compression with abnormal cord signal.  Canal diameter 4-5 mm. Bilateral C4 nerve root impingement. No cord hemorrhage or epidural hematoma.  C4-5:  Shallow central protrusion. Congenital stenosis. Bilateral C5 nerve root impingement.  Cord compression with canal diameter 6 mm but no abnormal cord signal.  C5-6:  Mild bulge.  Congenital stenosis.  Mild cord flattening with canal diameter 8 mm.  No definite C6 nerve root impingement.  C6-7:  Mild bulge.  Congenital stenosis.  Mild cord flattening with canal diameter 8 mm.  C7-T1:  Mild bulge.  Congenital stenosis.  Canal diameter 8 mm.  No impingement.  Compared with prior CT, there is good general agreement.  IMPRESSION: The dominant abnormality is at C3-C4 where congenitally short pedicles in conjunction with reversal of the normal cervical lordotic curve and a central disc extrusion results  in severe spinal stenosis with cord compression and abnormal cord signal. Canal diameter 4-5 mm.  Findings consistent with acute  cord contusion.  No epidural hematoma or hematomyelia.  Moderate congenital and acquired stenosis C2-T1.   Original Report Authenticated By: Davonna Belling, M.D.    Review of Systems  Constitutional: Negative.   HENT: Positive for neck pain.   Respiratory: Negative.   Cardiovascular: Negative.   Gastrointestinal: Negative.   Genitourinary: Negative.   Skin: Negative.   Neurological: Positive for sensory change and focal weakness.  Endo/Heme/Allergies: Negative.     Blood pressure 129/67, pulse 62, temperature 98 F (36.7 C), temperature source Oral, resp. rate 18, height 5' 9.5" (1.765 m), weight 131.543 kg (290 lb), SpO2 99.00%. Physical Exam hent,  Some facial abrasions. No blood or csf in ears or nose. Neck , some tenderness posteriorly. Cv, nl. Lungs, some ronchii. Abdomen, soft. Extremities, nl  NEURO  Mentally, nl. Strength, weakness both hans, unable to grip. Weakness both legs. Dtr, 3 plus . Babinski,/ cervical mri shows severe stenosis at c34, with cord compromise. Severe stenosis at c45. modrate stenosis belw.  Assessment/Plan Patient to have a c34, 45 decompression and fusion. He is aware of the risks such a vocal cord damage(HE IS A SINGER) NO IMPROVEMENT. CSF LEAK, PARALYSIS. NEED OF FURTHER SURGERY  Saray Capasso M 10/07/2012, 5:20 PM

## 2012-10-07 NOTE — ED Provider Notes (Signed)
CSN: [id number]     Arrival date & time 10/07/12  4098 History   First MD Initiated Contact with Patient 10/07/12 [phone]     Chief Complaint  Patient presents with  . Extremity Weakness  . Numbness   (Consider location/radiation/quality/duration/timing/severity/associated sxs/prior Treatment) HPI This 57 year old male fell at work 4 days ago and has had persistent neck pain and left shoulder pain and right hip pain since then however he states since the fall he has also had new onset of moderate bilateral weakness and numbness in his arms and legs with persistent weakness in his arms intermittent slight numbness in his legs but he states he is no longer able to climb stairs and it is difficult to stand up from sitting position he is walking abnormally and has had to start using a cane since his fall, he has chronic urinary incontinence with urgency which is slightly worse the last few days, he has not noticed any perianal numbness, he denies headache confusion chest pain shortness breath abdominal pain, his right hip pain is mild, his left shoulder pain is moderate and worse with movement with decreased range of motion since his fall to the left shoulder limited by pain. He had CT scan of the head and cervical spine with impression as pasted below: IMPRESSION: CT HEAD IMPRESSION Possible scalp soft tissue injury. No underlying skull fracture. Normal noncontrast CT appearance of the brain. CT CERVICAL SPINE IMPRESSION 1. No acute fracture or listhesis identified in the cervical spine. Ligamentous injury is not excluded. 2. 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 3. Marked thyromegaly, incompletely visible.   Past Medical History  Diagnosis Date  . Hypertension   . Diabetes mellitus without complication    Past Surgical History  Procedure Laterality Date  . Eye surgery     No family history on file. History  Substance  Use Topics  . Smoking status: Former Smoker    Quit date: 01/13/2012  . Smokeless tobacco: Not on file  . Alcohol Use: Yes    Review of Systems 10 Systems reviewed and are negative for acute change except as noted in the HPI. Allergies  Review of patient's allergies indicates no known allergies.  Home Medications   No current outpatient prescriptions on file. BP 128/72  Pulse 81  Temp(Src) 98.5 F (36.9 C) (Oral)  Resp 18  Ht 5\' 9"  (1.753 m)  Wt 304 lb 7.3 oz (138.1 kg)  BMI 44.94 kg/m2  SpO2 96% Physical Exam  Nursing note and vitals reviewed. Constitutional:  Awake, alert, nontoxic appearance with baseline speech for patient.  HENT:  Head: Atraumatic.  Mouth/Throat: No oropharyngeal exudate.  Eyes: EOM are normal. Pupils are equal, round, and reactive to light. Right eye exhibits no discharge. Left eye exhibits no discharge.  Neck: Neck supple.  Cardiovascular: Normal rate and regular rhythm.   No murmur heard. Pulmonary/Chest: Effort normal and breath sounds normal. No stridor. No respiratory distress. He has no wheezes. He has no rales. He exhibits no tenderness.  Abdominal: Soft. Bowel sounds are normal. He exhibits no mass. There is no tenderness. There is no rebound.  Musculoskeletal: He exhibits tenderness.  Baseline ROM right arm and legs with limited range of motion left shoulder due to pain, right hip is minimally tender laterally, back is nontender, neck has minimal diffuse tenderness posteriorly,  Patient is able to stand and bear weight on his right leg.  Lymphadenopathy:    He  has no cervical adenopathy.  Neurological: He is alert.  Awake, alert, cooperative and aware of situation; motor strength 4/5 bilaterally arms and legs; sensation normal to light touch bilaterally legs but slight numbness diffusely both arms; gait is slow and shuffling with cane due to bilateral leg weakness  Skin: No rash noted.  Psychiatric: He has a normal mood and affect.    ED  Course  Procedures (including critical care time) Patient / Family / Caregiver understand and agree with initial ED impression and plan with expectations set for ED visit. MR C-S ordered.1000 Rectal: Normal anal tone and sensation; NSurg paged. 1325 Patient / Family / Caregiver informed of clinical course, understand medical decision-making process, and agree with plan.NSurg requests transfer to [facility] for OR today.  Labs Review Labs Reviewed  CBC WITH DIFFERENTIAL - Abnormal; Notable for the following:    Hemoglobin 12.7 (*)    All other components within normal limits  BASIC METABOLIC PANEL - Abnormal; Notable for the following:    Glucose, Bld 104 (*)    All other components within normal limits  GLUCOSE, CAPILLARY - Abnormal; Notable for the following:    Glucose-Capillary 128 (*)    All other components within normal limits  MRSA PCR SCREENING   Imaging Review Dg Hip Complete Right  10/07/2012   CLINICAL DATA:  Fall several days ago. Right hip pain  EXAM: RIGHT HIP - COMPLETE 2+ VIEW  COMPARISON:  None.  FINDINGS: Degenerative change is present in both hip joints with joint space narrowing and spurring.  Negative for fracture or AVN of the right hip.  IMPRESSION: Degenerative change of both hip joints. Negative for fracture.   Electronically Signed   By: Marlan Palau M.D.   On: 10/07/2012 10:38   Mr Cervical Spine Wo Contrast  10/07/2012   *RADIOLOGY REPORT*  Clinical Data: Status post fall 10/03/2012.  Neck pain.  Bilateral upper extremity numbness has developed.  MRI CERVICAL SPINE WITHOUT CONTRAST  Technique:  Multiplanar and multiecho pulse sequences of the cervical spine, to include the craniocervical junction and cervicothoracic junction, were obtained according to standard protocol without intravenous contrast.  Comparison: CT cervical spine 10/03/2012.z  Findings: Reduced image quality due to body habitus and the some patient motion.   Congenital spinal stenosis redemonstrated with  short pedicles. Mild reversal normal cervical lordotic curve.  No vertebral body compression fracture or traumatic subluxation. No epidural hematoma or hematomyelia.  The individual disc spaces were examined as follows:  C2-3:  Central bulging annular fibers.  Congenital and acquired stenosis with canal diameter 7 mm.  Effacement anterior subarachnoid space without frank cord compression.  C3-4:  Central disc extrusion. Congenital stenosis.  Significant cord compression with abnormal cord signal.  Canal diameter 4-5 mm. Bilateral C4 nerve root impingement. No cord hemorrhage or epidural hematoma.  C4-5:  Shallow central protrusion. Congenital stenosis. Bilateral C5 nerve root impingement.  Cord compression with canal diameter 6 mm but no abnormal cord signal.  C5-6:  Mild bulge.  Congenital stenosis.  Mild cord flattening with canal diameter 8 mm.  No definite C6 nerve root impingement.  C6-7:  Mild bulge.  Congenital stenosis.  Mild cord flattening with canal diameter 8 mm.  C7-T1:  Mild bulge.  Congenital stenosis.  Canal diameter 8 mm.  No impingement.  Compared with prior CT, there is good general agreement.  IMPRESSION: The dominant abnormality is at C3-C4 where congenitally short pedicles in conjunction with reversal of the normal cervical lordotic curve and  a central disc extrusion results in severe spinal stenosis with cord compression and abnormal cord signal. Canal diameter 4-5 mm.  Findings consistent with acute cord contusion.  No epidural hematoma or hematomyelia.  Moderate congenital and acquired stenosis C2-T1.   Original Report Authenticated By: Davonna Belling, M.D.    MDM   1. Cervical cord compression with myelopathy    The patient appears reasonably stabilized for transfer considering the current resources, flow, and capabilities available in the ED at this time, and I doubt any other [facility] requiring further screening and/or treatment in the ED prior to transfer.    Hurman Horn,  MD 10/08/12 [phone]

## 2012-10-07 NOTE — ED Notes (Signed)
Pt reports bilateral arm and leg weakness/numbness, right sided hip pain since fall on 9/22. Pt has increased urge to urinate with new incontinence.

## 2012-10-07 NOTE — Transfer of Care (Signed)
Immediate Anesthesia Transfer of Care Note  Patient: Antoinne Spadaccini  Procedure(s) Performed: Procedure(s) with comments: ANTERIOR CERVICAL DECOMPRESSION/DISCECTOMY FUSION 2 LEVELS (N/A) - Anterior Cervical Three-Four/Four-Five Decompression and Fusion  Patient Location: PACU  Anesthesia Type:General  Level of Consciousness: oriented, sedated, patient cooperative and responds to stimulation  Airway & Oxygen Therapy: Patient Spontanous Breathing and Patient connected to nasal cannula oxygen  Post-op Assessment: Report given to PACU RN, Post -op Vital signs reviewed and stable and Patient moving all extremities X 4  Post vital signs: Reviewed and stable  Complications: No apparent anesthesia complications

## 2012-10-07 NOTE — ED Notes (Signed)
MD at bedside. 

## 2012-10-07 NOTE — Anesthesia Postprocedure Evaluation (Signed)
  Anesthesia Post-op Note  Patient: Dale Key Stable  Procedure(s) Performed: Procedure(s) with comments: ANTERIOR CERVICAL DECOMPRESSION/DISCECTOMY FUSION 2 LEVELS (N/A) - Anterior Cervical Three-Four/Four-Five Decompression and Fusion  Patient Location: PACU  Anesthesia Type:General  Level of Consciousness: awake, alert  and oriented  Airway and Oxygen Therapy: Patient Spontanous Breathing and Patient connected to face mask oxygen  Post-op Pain: mild  Post-op Assessment: Post-op Vital signs reviewed  Post-op Vital Signs: Reviewed  Complications: No apparent anesthesia complications

## 2012-10-07 NOTE — ED Notes (Signed)
Pt to MRI [id number]

## 2012-10-07 NOTE — ED Notes (Signed)
Pt transported to xray 

## 2012-10-07 NOTE — ED Notes (Signed)
Patient transported to MRI 

## 2012-10-08 ENCOUNTER — Encounter (HOSPITAL_COMMUNITY): Payer: Self-pay | Admitting: Anesthesiology

## 2012-10-08 LAB — MRSA PCR SCREENING: MRSA by PCR: NEGATIVE

## 2012-10-08 LAB — GLUCOSE, CAPILLARY
Glucose-Capillary: 145 mg/dL — ABNORMAL HIGH (ref 70–99)
Glucose-Capillary: 147 mg/dL — ABNORMAL HIGH (ref 70–99)

## 2012-10-08 MED ORDER — DEXAMETHASONE 4 MG PO TABS
4.0000 mg | ORAL_TABLET | Freq: Four times a day (QID) | ORAL | Status: DC
  Administered 2012-10-08 – 2012-10-13 (×13): 4 mg via ORAL
  Filled 2012-10-08 (×24): qty 1

## 2012-10-08 MED ORDER — SODIUM CHLORIDE 0.9 % IJ SOLN: 3.0000 mL | INTRAMUSCULAR | Status: DC | PRN

## 2012-10-08 MED ORDER — ACETAMINOPHEN 325 MG PO TABS: 650.0000 mg | ORAL_TABLET | ORAL | Status: DC | PRN

## 2012-10-08 MED ORDER — SODIUM CHLORIDE 0.9 % IV SOLN
INTRAVENOUS | Status: AC
Start: 2012-10-08 — End: 2012-10-08
  Administered 2012-10-08: 125 mL/h via INTRAVENOUS

## 2012-10-08 MED ORDER — DIAZEPAM 5 MG PO TABS: 5.0000 mg | ORAL_TABLET | Freq: Four times a day (QID) | ORAL | Status: DC | PRN

## 2012-10-08 MED ORDER — SODIUM CHLORIDE 0.9 % IV SOLN: 250.0000 mL | INTRAVENOUS | Status: DC

## 2012-10-08 MED ORDER — SODIUM CHLORIDE 0.9 % IV SOLN
INTRAVENOUS | Status: DC
  Administered 2012-10-08: 10:00:00 via INTRAVENOUS

## 2012-10-08 MED ORDER — DEXAMETHASONE SODIUM PHOSPHATE 4 MG/ML IJ SOLN
4.0000 mg | Freq: Four times a day (QID) | INTRAMUSCULAR | Status: DC
  Administered 2012-10-08 – 2012-10-13 (×10): 4 mg via INTRAVENOUS
  Filled 2012-10-08 (×26): qty 1

## 2012-10-08 MED ORDER — OXYCODONE-ACETAMINOPHEN 5-325 MG PO TABS: 1.0000 | ORAL_TABLET | ORAL | Status: DC | PRN

## 2012-10-08 MED ORDER — SODIUM CHLORIDE 0.9 % IJ SOLN
3.0000 mL | Freq: Two times a day (BID) | INTRAMUSCULAR | Status: DC
  Administered 2012-10-08 – 2012-10-09 (×3): 3 mL via INTRAVENOUS

## 2012-10-08 MED ORDER — ONDANSETRON HCL 4 MG/2ML IJ SOLN: 4.0000 mg | INTRAMUSCULAR | Status: DC | PRN

## 2012-10-08 MED ORDER — MORPHINE SULFATE 2 MG/ML IJ SOLN
1.0000 mg | INTRAMUSCULAR | Status: DC | PRN
  Administered 2012-10-08 (×2): 2 mg via INTRAVENOUS
  Filled 2012-10-08 (×2): qty 1

## 2012-10-08 MED ORDER — ACETAMINOPHEN 650 MG RE SUPP: 650.0000 mg | RECTAL | Status: DC | PRN

## 2012-10-08 MED ORDER — CEFAZOLIN SODIUM 1-5 GM-% IV SOLN
1.0000 g | Freq: Three times a day (TID) | INTRAVENOUS | Status: AC
  Administered 2012-10-08 (×2): 1 g via INTRAVENOUS
  Filled 2012-10-08 (×2): qty 50

## 2012-10-08 NOTE — Evaluation (Signed)
Physical Therapy Evaluation Patient Details Name: Dale Key MRN: [id number] DOB: October 10, 1955 Today's Date: 10/08/2012 Time: 4098-1191 PT Time Calculation (min): 18 min  PT Assessment / Plan / Recommendation History of Present Illness  pt presents with fall ~4days prior to admit.  pt found to have central cord compression and underwent C3-5 ACDF.    Clinical Impression  Pt would benefit from further PT to maximize independence prior to returning home with girlfriend's support.  Pt would be great for CIR.      PT Assessment  Patient needs continued PT services    Follow Up Recommendations  CIR    Does the patient have the potential to tolerate intense rehabilitation      Barriers to Discharge        Equipment Recommendations  Rolling walker with 5" wheels;3in1 (PT)    Recommendations for Other Services Rehab consult   Frequency Min 5X/week    Precautions / Restrictions Precautions Precautions: Cervical;Fall Restrictions Weight Bearing Restrictions: No   Pertinent Vitals/Pain Indicates pain around surgical site and Bil shoulders, "Sore".  Premedicated.        Mobility  Bed Mobility Bed Mobility: Supine to Sit;Sitting - Scoot to Edge of Bed Supine to Sit: 1: +2 Total assist Supine to Sit: Patient Percentage: 50% Sitting - Scoot to Edge of Bed: 3: Mod assist Details for Bed Mobility Assistance: pt attempts to A with mobility, however limited by UE weakness.   Transfers Transfers: Sit to Stand;Stand to Sit;Stand Pivot Transfers Sit to Stand: 1: +2 Total assist;With upper extremity assist;From bed Sit to Stand: Patient Percentage: 50% Stand to Sit: 1: +2 Total assist;With upper extremity assist;To chair/3-in-1 Stand to Sit: Patient Percentage: 60% Stand Pivot Transfers: 1: +2 Total assist Stand Pivot Transfers: Patient Percentage: 60% Details for Transfer Assistance: Cues for sequencing, safe technique, and ecouragement.  pt with tremors in Bil LEs ? 2/2 weakness and  effort.  With increased time pt is able to take steps to pivot to chair.   Ambulation/Gait Ambulation/Gait Assistance: Not tested (comment) Stairs: No Wheelchair Mobility Wheelchair Mobility: No    Exercises     PT Diagnosis: Difficulty walking;Acute pain  PT Problem List: Decreased strength;Decreased activity tolerance;Decreased balance;Decreased mobility;Decreased knowledge of use of DME;Pain;Obesity PT Treatment Interventions: DME instruction;Gait training;Stair training;Functional mobility training;Therapeutic activities;Therapeutic exercise;Balance training;Neuromuscular re-education;Patient/family education     PT Goals(Current goals can be found in the care plan section) Acute Rehab PT Goals Patient Stated Goal: Back to normal PT Goal Formulation: With patient Time For Goal Achievement: 10/22/12 Potential to Achieve Goals: Good  Visit Information  Last PT Received On: 10/08/12 Assistance Needed: +2 History of Present Illness: pt presents with fall ~4days prior to admit.  pt found to have central cord compression and underwent C3-5 ACDF.         Prior Functioning  Home Living Family/patient expects to be discharged to:: Private residence Living Arrangements: Alone Available Help at Discharge: Friend(s) (Near 24hr per pt) Type of Home: House Home Layout: One level Prior Function Level of Independence: Independent Communication Communication: No difficulties Dominant Hand: Right    Cognition  Cognition Arousal/Alertness: Awake/alert Behavior During Therapy: Flat affect Overall Cognitive Status: Within Functional Limits for tasks assessed    Extremity/Trunk Assessment Upper Extremity Assessment Upper Extremity Assessment: Defer to OT evaluation Lower Extremity Assessment Lower Extremity Assessment: Generalized weakness Cervical / Trunk Assessment Cervical / Trunk Assessment: Normal   Balance Balance Balance Assessed: Yes Static Sitting Balance Static Sitting  - Balance Support: Bilateral  upper extremity supported;Feet supported Static Sitting - Level of Assistance: 5: Stand by assistance Static Sitting - Comment/# of Minutes: Once feet supported on floor, pt able to be S for sitting.    End of Session PT - End of Session Equipment Utilized During Treatment: Gait belt Activity Tolerance: Patient limited by fatigue Patient left: in chair;with call bell/phone within reach Nurse Communication: Mobility status  GP     Sunny Schlein,  [phone] 10/08/2012, 11:28 AM

## 2012-10-08 NOTE — Progress Notes (Signed)
Patient ID: Dale Key, male   DOB: 05-09-1955, 57 y.o.   MRN: [id number] Voice of, weakness of upper extremities more in hands as preop. Eating. Seen by pt.

## 2012-10-08 NOTE — Progress Notes (Signed)
Approximately 15 minutes following the patient's morning medications, the patient began coughing while working with physical therapy. He coughed up a partially dissolved white pill, which he spit into a paper towel. The physical therapist notified me and I contacted Dr. Jeral Fruit, who ordered an SLP bedside swallow evaluation. Will continue to monitor patient and update as needed.

## 2012-10-08 NOTE — Progress Notes (Signed)
Patient ID: Dale Key, male   DOB: [birth year], 57 y.o.   MRN: [id number] Stable, c/o some secretions in throat. Voice ok. ba swallows next week.

## 2012-10-08 NOTE — Evaluation (Addendum)
Clinical/Bedside Swallow Evaluation Patient Details  Name: Dale Key MRN: [id number] Date of Birth: 06/03/1955  Today's Date: 10/08/2012 Time: 4098-1191 SLP Time Calculation (min): 20 min  Past Medical History:  Past Medical History  Diagnosis Date  . Hypertension   . Diabetes mellitus without complication    Past Surgical History:  Past Surgical History  Procedure Laterality Date  . Eye surgery     HPI:  Pt is S/P fall with injury to  head, 4 days ago. Now admitted with tingling and numbness in hands and legs. S/P surgery on 10/07/12 for C3,4 4,5  decompression/ fusion  Nursing reports patient coughed up pill well after meds being given this morning.   Assessment / Plan / Recommendation Clinical Impression  Pt presents with suspected pharyngeal phase deficitswith noted mild overt s/s of swallowing difficulty with large, sequential sips of thin via straw.  (Pt reportedly coughing up pills well after pills being given) also note bright orange in suction cup, which appears to also be medication. MBSS planned today.     Aspiration Risk  Moderate    Diet Recommendation Thin liquid   Medication Administration: Crushed with puree Supervision: Full supervision/cueing for compensatory strategies Compensations: Slow rate;Small sips/bites Postural Changes and/or Swallow Maneuvers: Seated upright 90 degrees    Other  Recommendations Recommended Consults: MBS Oral Care Recommendations: Oral care QID Other Recommendations: Have oral suction available   Follow Up Recommendations  Other (comment) (acute ST to follow )    Frequency and Duration min 2x/week  1 week   Pertinent Vitals/Pain Pian level, 3, left side of neck. Has received pain management.     SLP Swallow Goals Patient will consume recommended diet without observed clinical signs of aspiration with: Supervision/safety Current diet is clear liquids with small, single straw sips.    Swallow Study Prior Functional  Status  Type of Home: House Available Help at Discharge: Friend(s) (Near 24hr per pt)    General Date of Onset: 10/07/12 HPI: Pt is S/P fall with injury to  head, 4 days ago. Now admitted with tingling and numbness in hands and legs. S/P surgery on 10/07/12 for C3,4 4,5  decompression/ fusion  Type of Study: Bedside swallow evaluation Diet Prior to this Study: Regular Temperature Spikes Noted: No Respiratory Status: Supplemental O2 delivered via (comment) ([place]) Oral Cavity - Dentition: Adequate natural dentition Self-Feeding Abilities: Needs assist Baseline Vocal Quality: Low vocal intensity Volitional Cough: Weak Volitional Swallow: Able to elicit    Oral/Motor/Sensory Function Overall Oral Motor/Sensory Function: Appears within functional limits for tasks assessed Labial ROM: Within Functional Limits Labial Symmetry: Within Functional Limits Labial Strength: Within Functional Limits Lingual ROM: Within Functional Limits Lingual Symmetry: Within Functional Limits Lingual Strength: Within Functional Limits Facial ROM: Within Functional Limits Facial Symmetry: Within Functional Limits Velum: Within Functional Limits   Ice Chips     Thin Liquid Thin Liquid: Impaired (mild throat clearing noted with large, sequential straw dips) Presentation: Straw Pharyngeal  Phase Impairments: Throat Clearing - Delayed;Decreased hyoid-laryngeal movement (difficult to palpate larynx due to neck thickness )    Nectar Thick Nectar Thick Liquid: Not tested   Honey Thick     Puree Puree: Impaired (pt verbally c/o "it didn't go down easily" ) Presentation: Spoon Pharyngeal Phase Impairments: Decreased hyoid-laryngeal movement   Solid   GO    Solid: Impaired Pharyngeal Phase Impairments: Decreased hyoid-laryngeal movement (pt c/o cracker not going down easily )       [name], [place] 10/08/2012,11:51 AM  MBSS to be completed next date. Continue diet as indicated above.  Sammuel Hines,  CCC-SLP

## 2012-10-09 LAB — GLUCOSE, CAPILLARY
Glucose-Capillary: 148 mg/dL — ABNORMAL HIGH (ref 70–99)
Glucose-Capillary: 145 mg/dL — ABNORMAL HIGH (ref 70–99)
Glucose-Capillary: 150 mg/dL — ABNORMAL HIGH (ref 70–99)

## 2012-10-09 MED ORDER — PANTOPRAZOLE SODIUM 40 MG IV SOLR
40.0000 mg | Freq: Every day | INTRAVENOUS | Status: DC
  Administered 2012-10-09 – 2012-10-12 (×5): 40 mg via INTRAVENOUS
  Filled 2012-10-09 (×6): qty 40

## 2012-10-09 NOTE — Plan of Care (Signed)
Problem: Consults Goal: Diagnosis - Spinal Surgery Cervical Spine Fusion     

## 2012-10-09 NOTE — Progress Notes (Signed)
Patient ID: Dale Key, male   DOB: 1955/12/21, 57 y.o.   MRN: [id number] Oob, drain working well. Weakness both hands goes along with central cord injury. Continue decadron. Rehab to see

## 2012-10-09 NOTE — Progress Notes (Addendum)
Speech Language Pathology Dysphagia Treatment Patient Details Name: Dale Key MRN: [id number] DOB: Oct 26, 1955 Today's Date: 10/09/2012 Time: 4098-1191 SLP Time Calculation (min): 30 min  Assessment / Plan / Recommendation Clinical Impression  F/u from initial BSE for diet tolerance of clear liquids and to assess readiness to complete object evaluation of MBS.  Vocal quality clear prior to PO trials.  Initiation of swallow timely with functional laryngeal elevation.  Patient continues with  s/s of penetration s/p swallow of thin liquid and puree consistency from possible residuals.   Suspect structural  pharyngeal dysphagia with possible edema.  Strategies of multiple hard swallows per sip and bite with hard cough intermittently effective in reducing s/s.  Will defer completion of objective evaluation this date as judge patient will remain on current diet with strategies.  Recommend to continue current diet of clear liquid with full supervision with all meals to ensure safety.  If patient presents with increased coughing or changes in respiratory status  with current diet recommend to change status to NPO.   ST to follow on 10/10/12 for possible improvements and complete MBS if indicated.      Diet Recommendation  Continue with Current Diet: Thin liquid    SLP Plan Continue with current plan of care      Swallowing Goals  SLP Swallowing Goals Patient will utilize recommended strategies during swallow to increase swallowing safety with: Supervision/safety  General Temperature Spikes Noted: No Respiratory Status: Supplemental O2 delivered via (comment) (nasal cannula) Behavior/Cognition: Alert;Cooperative Oral Cavity - Dentition: Adequate natural dentition Patient Positioning: Upright in chair  Oral Cavity - Oral Hygiene Does patient have any of the following "at risk" factors?: Oxygen therapy - cannula, mask, simple oxygen devices Patient is HIGH RISK - Oral Care Protocol followed (see  row info): Yes Patient is AT RISK - Oral Care Protocol followed (see row info): Yes   Dysphagia Treatment Treatment focused on: Skilled observation of diet tolerance;Patient/family/caregiver education Treatment Methods/Modalities: Skilled observation;Differential diagnosis;Effortful swallow Patient observed directly with PO's: Yes Type of PO's observed: Thin liquids;Dysphagia 1 (puree);Ice chips Liquids provided via: Teaspoon;Cup;Straw Pharyngeal Phase Signs & Symptoms: Multiple swallows;Delayed throat clear Type of cueing: Verbal;Visual Amount of cueing: Moderate   GO    Moreen Fowler MS, CCC-SLP [phone] [facility] 10/09/2012, 4:30 PM

## 2012-10-09 NOTE — Op Note (Signed)
NAMEMarland Kitchen  BRAYTON, Dale Key NO.:  [id number]  MEDICAL RECORD NO.:  [id number]  LOCATION:  3M05C                        FACILITY:  [facility]  PHYSICIAN:  Dale Key, M.D.   DATE OF BIRTH:  January 18, 1955  DATE OF PROCEDURE:  10/07/2012 DATE OF DISCHARGE:                              OPERATIVE REPORT   PREOPERATIVE DIAGNOSES:  Cervical myelopathy with severe stenosis at the level of cervical 3-4, 4-5.  POSTOPERATIVE DIAGNOSES:  Cervical myelopathy with severe stenosis at the level of cervical 3-4, 4-5.  PROCEDURE:  Anterior C3-4, 4-5 diskectomy, decompression of spinal cord, interbody fusion with cages, plate, microscope.  SURGEON:  Dale Key, M.D.  CLINICAL HISTORY:  Dale Key is a gentleman who is almost 300 pounds, who fell 5 days ago.  He was seen in the emergency room, was sent home and since then, the family had noticed difficulty walking and he had weakness of the arm up to the point that he was unable to move his arms with weakness mostly in the hands and distally in both legs.  Cervical MRIs showed that he has severe stenosis at the level of C3-4, 4-5 with damage to the spinal cord right at the level C3-C4.also a large thyroid  I talked to him, I talked to the girlfriend, and talked to the sister.  We delayed the surgery for at least 2 hour because I just wanted to be sure that the patient agrees with surgery and he knew what the risks were.  Only when I was able to talk to the sister, it is when he decided on surgery.  Also I offered him to postpone the surgery or to send him to one of the medical center.  Nevertheless, at the end, he agreed with surgery.  He wanted posterior approach but I told that posterior approach will not be advisable at the beginning because the problem is anteriorly.  By MRI, the patient has a large thyroid.  He is quite morbid obese.  He has a history of COPD and diabetes.  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  Of course, the risk with the surgery are paralysis, hematoma, CSF leak, damage to vocal cord, and need for further surgery through a posterior approach.  He also knows that the surgery will not correct the problem immediately, it is going to take quite a bit of rehabilitation.  He has a clinical history of central cord injury.  DESCRIPTION OF PROCEDURE:  The patient was taken to the OR.  After intubation, the left side of the neck was cleaned with DuraPrep.  The only way to be able to move his neck and be able to see the neck was to _tape_________ the chin to an IV pole, that was the only way that we were able to open up the neck.  No traction was applied.  Then, transverse incision was made through the skin, subcutaneous tissue straight down to the cervical spine.  In our way, we found to have a large thyroid, it was partially occluding the C3-C4 area.  Retraction was done.  X-ray was taken 3 times yet to be sure.  The patient has a large osteophyte At 3-4  And the needle  __________was at the level 4-5.  From then on, we removed the anterior osteophyte with some at the level of C3-C4.  The procedure took twice as long because of the size of this gentleman's neck. Nevertheless, we were able to remove the anterior ligament and then, we went through the removal of degenerative disk disease, we opened the posterior ligament, which showed that indeed there was an opening in the posterior ligament with disk compromising the spinal cord.retraction during surgery was done using ABDOMINAL retractors because of his size Decompression was achieved with plenty of space at this level __________.  The same procedure was done at the level of C4-C5 which found also quite a bit of stenosis of the canal.  Doing the disk dissection took [place] at least 90 minutes.  At the end, we had decompression of the spinal  cord and we were able to drill the endplates.  I introduced 2 cages of 7-mm height, lordotic with autograft and DBX.  This was followed by a plate using 6 screws.  Lateral cervical spine showed that the plate was at the level of C3, C4, C5.  The area was irrigated, although hemostasis was accomplished and nevertheless, we left a drain. The wound was closed with Vicryl and Steri-Strips.  The patient is going to go to the Intensive Care Unit, and we are going to call Rehab to help [place] with his care next week.          ______________________________ Dale Key, M.D.     EB/MEDQ  D:  10/07/2012  T:  10/08/2012  Job:  [id number]

## 2012-10-10 DIAGNOSIS — M47812 Spondylosis without myelopathy or radiculopathy, cervical region: Secondary | ICD-10-CM

## 2012-10-10 DIAGNOSIS — S14101A Unspecified injury at C1 level of cervical spinal cord, initial encounter: Secondary | ICD-10-CM

## 2012-10-10 DIAGNOSIS — W19XXXA Unspecified fall, initial encounter: Secondary | ICD-10-CM

## 2012-10-10 LAB — GLUCOSE, CAPILLARY
Glucose-Capillary: 161 mg/dL — ABNORMAL HIGH (ref 70–99)
Glucose-Capillary: 153 mg/dL — ABNORMAL HIGH (ref 70–99)
Glucose-Capillary: 154 mg/dL — ABNORMAL HIGH (ref 70–99)

## 2012-10-10 MED ORDER — FUROSEMIDE 40 MG PO TABS
40.0000 mg | ORAL_TABLET | Freq: Every day | ORAL | Status: DC
  Administered 2012-10-10 – 2012-10-13 (×4): 40 mg via ORAL
  Filled 2012-10-10 (×3): qty 1

## 2012-10-10 NOTE — Progress Notes (Signed)
Rehab Admissions Coordinator Note:  Patient was screened by Brock Ra for appropriateness for an Inpatient Acute Rehab Consult.  At this time, pt already has an order for an Inpatient Rehab consult.  Melanee Spry S 10/10/2012, 9:04 AM  I can be reached at [phone].

## 2012-10-10 NOTE — Clinical Social Work Note (Signed)
Clinical Social Worker received referral for possible ST-SNF placement.  Chart reviewed.  PT/OT recommending inpatient rehab placement.  Rehab admissions RN  will follow up with patient to discuss rehab possibilities.    CSW signing off - please re consult if social work needs arise.  Macario Golds, [place] [phone]

## 2012-10-10 NOTE — Progress Notes (Signed)
Speech Language Pathology Dysphagia Treatment Patient Details Name: Dale Key MRN: [id number] DOB: 12-10-1955 Today's Date: 10/10/2012 Time: 0811-0819 SLP Time Calculation (min): 8 min  Assessment / Plan / Recommendation Clinical Impression  Pt seen in am, consuming thin liquids with no supervision, wet vocal quality and intermittent coughing. O2 sats in the high 80s. Pt not following recommended strategies for safe consumption of PO. Recommend MBS due to overt struggle with PO. Keep pt NPO until MBS.     Diet Recommendation  Initiate / Change Diet: NPO    SLP Plan MBS   Pertinent Vitals/Pain NA   Swallowing Goals     General Temperature Spikes Noted: No Respiratory Status: Supplemental O2 delivered via (comment) Behavior/Cognition: Alert;Cooperative Oral Cavity - Dentition: Adequate natural dentition Patient Positioning: Upright in bed  Oral Cavity - Oral Hygiene Does patient have any of the following "at risk" factors?: Oxygen therapy - cannula, mask, simple oxygen devices Patient is AT RISK - Oral Care Protocol followed (see row info): Yes   Dysphagia Treatment Treatment focused on: Skilled observation of diet tolerance Treatment Methods/Modalities: Skilled observation Patient observed directly with PO's: Yes Type of PO's observed: Thin liquids Feeding: Able to feed self Liquids provided via: Straw Pharyngeal Phase Signs & Symptoms: Immediate throat clear;Wet vocal quality;Multiple swallows Type of cueing: Verbal Amount of cueing: Minimal   GO    [name], MA CCC-SLP [phone]  [name] 10/10/2012, 8:33 AM

## 2012-10-10 NOTE — Progress Notes (Signed)
Patient ID: Dale Key, male   DOB: 12/31/1955, 57 y.o.   MRN: [id number] Stable, to have a swallowing study. Seen by rehab. To dc drain

## 2012-10-10 NOTE — Procedures (Signed)
Objective Swallowing Evaluation: Fiberoptic Endoscopic Evaluation of Swallowing  Patient Details  Name: Dale Key MRN: [id number] Date of Birth: 1955-03-20  Today's Date: 10/10/2012 Time: 1150-1220 SLP Time Calculation (min): 30 min  Past Medical History:  Past Medical History  Diagnosis Date  . Hypertension   . Diabetes mellitus without complication    Past Surgical History:  Past Surgical History  Procedure Laterality Date  . Eye surgery     HPI:  Pt is S/P fall with injury to  head, 4 days ago. Now admitted with tingling and numbness in hands and legs. S/P surgery on 10/07/12 for C3,4 4,5  decompression/ fusion      Assessment / Plan / Recommendation Clinical Impression  Dysphagia Diagnosis: Moderate cervical esophageal phase dysphagia Clinical impression: Pt demonstrates a moderate cervical esopahgeal phase dyphagia due to edema of posterior pharyngeal wall resulting in reduced opening of cricopharyngeus for passage of PO. There is mild pooling of thin liquids and secretions above UES with penetration between interarytenoid space. Posterior trachea not visible to determine quantity of aspiration, but suspect at least trace penetration based on occasional throat clearing. Given this observation, pt is believed to be protecting airway though intermittent trace penetration is probable. Thickened liquids and solids result in significant residuals. Dale Key has been consuming thin liquids for the past few days without temperature spikes, though O2 saturations have been low. Discussed with MD to inform him of concerns, he agreed to continue diet with close supervision of respiratory function. Recommend pt continue a clear liquid diet.     Treatment Recommendation  Therapy as outlined in treatment plan below    Diet Recommendation Thin liquid   Liquid Administration via: Straw (small sips) Medication Administration: Crushed with puree Supervision: Patient able to self feed;Full  supervision/cueing for compensatory strategies Compensations: Slow rate;Multiple dry swallows after each bite/sip;Hard cough after swallow Postural Changes and/or Swallow Maneuvers: Out of bed for meals;Seated upright 90 degrees;Upright 30-60 min after meal    Other  Recommendations Oral Care Recommendations: Oral care QID Other Recommendations: Have oral suction available   Follow Up Recommendations  None    Frequency and Duration min 2x/week  2 weeks   Pertinent Vitals/Pain NA    SLP Swallow Goals Patient will utilize recommended strategies during swallow to increase swallowing safety with: Minimal cueing Swallow Study Goal #2 - Progress: Progressing toward goal Goal #3: Pt will consume trials of solid textures without observed evidence of aspiration with min verbal cues.  Swallow Study Goal #3 - Progress: Progressing toward goal   General HPI: Pt is S/P fall with injury to  head, 4 days ago. Now admitted with tingling and numbness in hands and legs. S/P surgery on 10/07/12 for C3,4 4,5  decompression/ fusion  Type of Study: Fiberoptic Endoscopic Evaluation of Swallowing Reason for Referral: Objectively evaluate swallowing function Diet Prior to this Study: Thin liquids Temperature Spikes Noted: No Respiratory Status: Supplemental O2 delivered via (comment) History of Recent Intubation: Yes Length of Intubations (days): 1 days Date extubated: 10/08/12 Behavior/Cognition: Alert;Cooperative Oral Cavity - Dentition: Adequate natural dentition Oral Motor / Sensory Function: Within functional limits Self-Feeding Abilities: Able to feed self Patient Positioning: Upright in chair Baseline Vocal Quality: Low vocal intensity Volitional Cough: Weak Volitional Swallow: Able to elicit Anatomy:  (Probable edema on posterior pahryngeal wall ) Pharyngeal Secretions: Standing secretions in (comment) (above UES, interarytenoid space)    Reason for Referral Objectively evaluate swallowing  function   Oral Phase Oral Preparation/Oral Phase Oral Phase:  WFL   Pharyngeal Phase Pharyngeal Phase Pharyngeal Phase: Impaired Pharyngeal - Nectar Pharyngeal - Nectar Cup: Inter-arytenoid space residue;Pharyngeal residue - cp segment;Penetration/Aspiration after swallow Penetration/Aspiration details (nectar cup): Material enters airway, CONTACTS cords then ejected out Pharyngeal - Thin Pharyngeal - Thin Straw: Inter-arytenoid space residue;Pharyngeal residue - cp segment;Penetration/Aspiration after swallow Penetration/Aspiration details (thin straw): Material enters airway, CONTACTS cords then ejected out Pharyngeal - Solids Pharyngeal - Puree: Inter-arytenoid space residue;Pharyngeal residue - cp segment Penetration/Aspiration details (puree): Material does not enter airway  Cervical Esophageal Phase    GO    Cervical Esophageal Phase Cervical Esophageal Phase: Impaired        [name], MA CCC-SLP [phone]  Alanna Storti, Riley Nearing 10/10/2012, 1:52 PM

## 2012-10-10 NOTE — Progress Notes (Signed)
Physical Therapy Treatment Patient Details Name: Adelfo Diebel MRN: [id number] DOB: 1955/06/05 Today's Date: 10/10/2012 Time: 7829-5621 PT Time Calculation (min): 29 min  PT Assessment / Plan / Recommendation  History of Present Illness pt presents with fall ~4days prior to admit.  pt found to have central cord compression and underwent C3-5 ACDF.     PT Comments   Pt admitted with above. Pt currently with functional limitations due to continued balance  deficits and weakness bil UES.  Progressing needing much less assist today.  Pt will benefit from skilled PT to increase their independence and safety with mobility to allow discharge to the venue listed below.    Follow Up Recommendations  CIR                 Equipment Recommendations  Rolling walker with 5" wheels;3in1 (PT)    Recommendations for Other Services Rehab consult  Frequency Min 5X/week   Progress towards PT Goals Progress towards PT goals: Progressing toward goals  Plan Current plan remains appropriate    Precautions / Restrictions Precautions Precautions: Cervical;Fall Restrictions Weight Bearing Restrictions: No   Pertinent Vitals/Pain VSS, No pain    Mobility  Bed Mobility Bed Mobility: Supine to Sit;Sitting - Scoot to Edge of Bed Supine to Sit: 3: Mod assist;HOB elevated;With rails Sitting - Scoot to Edge of Bed: 3: Mod assist Details for Bed Mobility Assistance: pt attempts to A with mobility, however limited by UE weakness.   Transfers Transfers: Sit to Stand;Stand to Sit Sit to Stand: 4: Min assist;With upper extremity assist;From bed Stand to Sit: 4: Min assist;With upper extremity assist;To chair/3-in-1;With armrests Stand Pivot Transfers: Not tested (comment) Details for Transfer Assistance: Cues for sequencing, safe technique, and encouragement.  Ambulation/Gait Ambulation/Gait Assistance: 4: Min assist Ambulation Distance (Feet): 110 Feet Assistive device: Rolling walker;Straight  cane Ambulation/Gait Assistance Details: Pt ambulated with RW initially with fair safety.  Pt kept picking RW up and carrying it instead of pushing it.  Needed incr cues for safety with using RW as well as cane.  Used cane but pt needed cues for sequencing cane and steps.  Needs continued practice with devices.  Occasionally dragging left LE.   Gait Pattern: Step-through pattern;Decreased stride length;Left foot flat;Decreased step length - left Gait velocity: decreased Stairs: No Wheelchair Mobility Wheelchair Mobility: No    Exercises General Exercises - Lower Extremity Ankle Circles/Pumps: AROM;Both;10 reps;Seated Long Arc Quad: AROM;Both;10 reps;Seated    PT Goals (current goals can now be found in the care plan section)    Visit Information  Last PT Received On: 10/10/12 Assistance Needed: +2 History of Present Illness: pt presents with fall ~4days prior to admit.  pt found to have central cord compression and underwent C3-5 ACDF.      Subjective Data  Subjective: "I feel better."   Cognition  Cognition Arousal/Alertness: Awake/alert Behavior During Therapy: Flat affect Overall Cognitive Status: Within Functional Limits for tasks assessed    Balance  Static Sitting Balance Static Sitting - Balance Support: Bilateral upper extremity supported;Feet supported Static Sitting - Level of Assistance: 5: Stand by assistance Static Sitting - Comment/# of Minutes: 2 Static Standing Balance Static Standing - Balance Support: No upper extremity supported;During functional activity Static Standing - Level of Assistance: 4: Min assist Static Standing - Comment/# of Minutes: Pt stood at toilet to urinate up to 4 minutes.  Able to balance with occasional use of rail on wall.    End of Session PT - End of Session  Equipment Utilized During Treatment: Gait belt Activity Tolerance: Patient limited by fatigue Patient left: in chair;with call bell/phone within reach Nurse Communication:  Mobility status       INGOLD,Amar Keenum 10/10/2012, 1:26 PM [facility] Acute Rehabilitation [phone] (pager)

## 2012-10-10 NOTE — Progress Notes (Signed)
Occupational Therapy Evaluation Patient Details Name: Dale Key MRN: [id number] DOB: 1955/08/11 Today's Date: 10/10/2012 Time: 2956-2130 OT Time Calculation (min): 34 min  OT Assessment / Plan / Recommendation History of present illness pt presents with fall ~4days prior to admit.  pt found to have central cord compression and underwent C3-5 ACDF.     Clinical Impression   PTA, pt was independent with all ADL and mobility. Pt currently requires max A for ADL and mod A for functional mobility for ADL. Pt is excellent rehab candidate and is very motivated to return to PLOF. Pt will benefit from skilled OT services to facilitate D/C to next venue due to below deficits.    OT Assessment  Patient needs continued OT Services    Follow Up Recommendations  CIR    Barriers to Discharge      Equipment Recommendations  Other (comment) (TBA)    Recommendations for Other Services Rehab consult  Frequency  Min 3X/week    Precautions / Restrictions Precautions Precautions: Cervical;Fall Restrictions Weight Bearing Restrictions: No   Pertinent Vitals/Pain no apparent distress     ADL  Eating/Feeding: Minimal assistance;Other (comment) (modified utensil with built up long grip/theratubing. Only) Grooming: Moderate assistance Where Assessed - Grooming: Supported sitting Upper Body Bathing: Moderate assistance Where Assessed - Upper Body Bathing: Supported sitting Lower Body Bathing: Maximal assistance Where Assessed - Lower Body Bathing: Supported sit to stand Upper Body Dressing: Maximal assistance Where Assessed - Upper Body Dressing: Supported sitting Lower Body Dressing: Maximal assistance Where Assessed - Lower Body Dressing: Supported sit to Pharmacist, hospital: Moderate assistance;Other (comment) (sit - stand) Toileting - Clothing Manipulation and Hygiene: Moderate assistance (using urinal) Where Assessed - Toileting Clothing Manipulation and Hygiene: Sit to stand from  3-in-1 or toilet Transfers/Ambulation Related to ADLs: mod A from lower surface ADL Comments: significant decrease in funciton    OT Diagnosis: Generalized weakness;Acute pain;Paresis  OT Problem List: Decreased strength;Decreased range of motion;Decreased activity tolerance;Impaired balance (sitting and/or standing);Decreased coordination;Decreased safety awareness;Decreased knowledge of use of DME or AE;Decreased knowledge of precautions;Impaired sensation;Impaired tone;Impaired UE functional use;Pain OT Treatment Interventions: Self-care/ADL training;Therapeutic exercise;Neuromuscular education;DME and/or AE instruction;Therapeutic activities;Patient/family education;Balance training   OT Goals(Current goals can be found in the care plan section) Acute Rehab OT Goals Patient Stated Goal: Back to normal OT Goal Formulation: With patient Time For Goal Achievement: 10/24/12 Potential to Achieve Goals: Good  Visit Information  Last OT Received On: 10/10/12 Assistance Needed: +2 (for safety) History of Present Illness: pt presents with fall ~4days prior to admit.  pt found to have central cord compression and underwent C3-5 ACDF.         Prior Functioning     Home Living Family/patient expects to be discharged to:: Private residence Living Arrangements: Alone Available Help at Discharge: Friend(s) (Near 24hr per pt) Type of Home: House Home Layout: One level Home Equipment: None Prior Function Level of Independence: Independent Communication Communication: No difficulties Dominant Hand: Right         Vision/Perception     Cognition  Cognition Arousal/Alertness: Awake/alert Behavior During Therapy: Flat affect Overall Cognitive Status: Within Functional Limits for tasks assessed    Extremity/Trunk Assessment Upper Extremity Assessment Upper Extremity Assessment: RUE deficits/detail;LUE deficits/detail RUE Deficits / Details: AROM WFL with the exception of shoulder.  Scapular weakness. Shoulder FF @10  degrees. Abd @20 . Weak griip and pinch strength. RUE:  (unable to fully assess dur to cervical surgery) RUE Sensation: decreased light touch;decreased proprioception RUE Coordination: decreased fine  motor;decreased gross motor LUE Deficits / Details: AROM WFL with the exceptioin of shoulder. FF to @45 . ABD @ 30. min ER. More functional use L shoulder.  LUE:  (cervical surgery) LUE Sensation: decreased light touch;decreased proprioception LUE Coordination: decreased fine motor;decreased gross motor Lower Extremity Assessment Lower Extremity Assessment: Generalized weakness Cervical / Trunk Assessment Cervical / Trunk Assessment: Other exceptions;Normal (s/p cervical fusion)     Mobility Bed Mobility Bed Mobility: Not assessed STransfers Sit to Stand: 3: Mod assist;From chair/3-in-1 Stand to Sit: 4: Min assist;With upper extremity assist;To chair/3-in-1;With armrests Details for Transfer Assistance: vc for hand placement     Exercise  Other Exercises Other Exercises: squeeze ball given to use B hands   Balance Balance Balance Assessed: Yes Static Sitting Balance Static Sitting - Balance Support: Bilateral upper extremity supported;Feet supported Static Sitting - Level of Assistance: 5: Stand by assistance   End of Session OT - End of Session Equipment Utilized During Treatment: Gait belt;Cervical collar Activity Tolerance: Patient tolerated treatment well Patient left: in chair;with call bell/phone within reach Nurse Communication: Mobility status  GO     Areal Cochrane,H[place]ARY 10/10/2012, 4:58 PM [facility], OTR/L  [phone] 10/10/2012

## 2012-10-10 NOTE — Consult Note (Signed)
Physical Medicine and Rehabilitation Consult Reason for Consult: Cervical myelopathy Referring Physician: Dr. Jeral Fruit   HPI: Dale Key is a 57 y.o. right-handed male with history of hypertension as well as diabetes mellitus and peripheral neuropathy. 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 There was no loss of consciousness. Patient with complaints of tingling and numbness in his hands and legs as well as difficulty with ambulation. MRI C-spine showed acute cord contusion no epidural hematoma with severe stenosis at the level of C3-4 and 4-5. Underwent anterior cervical discectomy decompression of spinal cord interbody fusion 10/08/2012 per Dr. Jeral Fruit. Postoperative pain management. Decadron protocol as directed. Patient presently on a clear liquid diet with followup speech therapy for questionable dysphagia. Physical therapy evaluation completed 10/08/2012 with recommendations for physical medicine rehabilitation consult to consider inpatient rehabilitation services.  Review of Systems  Musculoskeletal:       Recent fall 4 days prior to admission  Neurological: Positive for tingling and weakness.  All other systems reviewed and are negative.   Past Medical History  Diagnosis Date  . Hypertension   . Diabetes mellitus without complication    Past Surgical History  Procedure Laterality Date  . Eye surgery     No family history on file. Social History:  reports that he quit smoking about 8 months ago. He does not have any smokeless tobacco history on file. He reports that  drinks alcohol. His drug history is not on file. Allergies: No Known Allergies Medications Prior to Admission  Medication Sig Dispense Refill  . brinzolamide (AZOPT) 1 % ophthalmic suspension Place 1 drop into both eyes 3 (three) times daily.      . furosemide (LASIX) 40 MG tablet Take 40 mg by mouth daily as needed for fluid.       Marland Kitchen losartan (COZAAR) 100 MG tablet Take 100 mg by mouth every morning.      . meloxicam (MOBIC) 15 MG tablet Take 15 mg by mouth daily as needed for pain.      . metFORMIN (GLUCOPHAGE) 1000 MG tablet Take 1,000 mg by mouth 2 (two) times daily with a meal.      . metoprolol succinate (TOPROL-XL) 50 MG 24 hr tablet Take 50 mg by mouth every morning. Take with or immediately following a meal.      . Multiple Vitamin (MULTIVITAMIN WITH MINERALS) TABS tablet Take 1 tablet by mouth every morning.      . potassium chloride SA (K-DUR,KLOR-CON) 20 MEQ tablet Take 20 mEq by mouth daily as needed (For potassium loss.).      Marland Kitchen travoprost, benzalkonium, (TRAVATAN) 0.004 % ophthalmic solution Place 1 drop into both eyes at bedtime.      . traMADol (ULTRAM) 50 MG tablet Take 1 tablet (50 mg total) by mouth every 6 (six) hours as needed for pain.  20 tablet  0    Home: Home Living Family/patient expects to be discharged to:: Private residence Living Arrangements: Alone Available Help at Discharge: Friend(s) (Near 24hr per pt) Type of Home: House Home Layout: One level  Functional History:   Functional Status:  Mobility: Bed Mobility Bed Mobility: Supine to Sit;Sitting - Scoot to Edge of Bed Supine to Sit: 1: +2 Total assist Supine to Sit: Patient Percentage: 50% Sitting - Scoot to Edge of Bed: 3: Mod assist Transfers Transfers: Sit to Stand;Stand to Sit;Stand Pivot Transfers Sit to Stand: 1: +2 Total assist;With upper extremity  assist;From bed Sit to Stand: Patient Percentage: 50% Stand to Sit: 1: +2 Total assist;With upper extremity assist;To chair/3-in-1 Stand to Sit: Patient Percentage: 60% Stand Pivot Transfers: 1: +2 Total assist Stand Pivot Transfers: Patient Percentage: 60% Ambulation/Gait Ambulation/Gait Assistance: Not tested (comment) Stairs: No Wheelchair Mobility Wheelchair Mobility: No  ADL:    Cognition: Cognition Overall Cognitive Status: Within Functional Limits for  tasks assessed Orientation Level: Oriented X4 Cognition Arousal/Alertness: Awake/alert Behavior During Therapy: Flat affect Overall Cognitive Status: Within Functional Limits for tasks assessed  Blood pressure 103/46, pulse 64, temperature 98.4 F (36.9 C), temperature source Oral, resp. rate 14, height 5\' 9"  (1.753 m), weight 138.1 kg (304 lb 7.3 oz), SpO2 97.00%. Physical Exam  Constitutional: He is oriented to person, place, and time.  HENT:  Head: Normocephalic.  Eyes: EOM are normal.  Neck: No thyromegaly present.  Cardiovascular: Normal rate and regular rhythm.   Pulmonary/Chest: Effort normal and breath sounds normal. No respiratory distress.  Abdominal: Soft. Bowel sounds are normal. He exhibits no distension.  Obese  Neurological: He is alert and oriented to person, place, and time.  Decreased FT/pain in hands/fingers. RUE 1/5 shoulder, 2+ to 3/5 distally. LUE is 3- deltoid, bicep, tricep, hand. RLE is 1+ prox at HF, 2-knee, 3 ankle. LLE is 2+ prox to 3+ distally. DTR's 1+  Skin:  Surgical site clean and dry with dressing intact  Psychiatric: He has a normal mood and affect.  A little difficult to redirect. Decreased insight and awareness.    Results for orders placed during the hospital encounter of 10/07/12 (from the past 24 hour(s))  GLUCOSE, CAPILLARY     Status: Abnormal   Collection Time    10/09/12  7:23 AM      Result Value Range   Glucose-Capillary 163 (*) 70 - 99 mg/dL  GLUCOSE, CAPILLARY     Status: Abnormal   Collection Time    10/09/12 11:57 AM      Result Value Range   Glucose-Capillary 148 (*) 70 - 99 mg/dL  GLUCOSE, CAPILLARY     Status: Abnormal   Collection Time    10/09/12  4:51 PM      Result Value Range   Glucose-Capillary 145 (*) 70 - 99 mg/dL  GLUCOSE, CAPILLARY     Status: Abnormal   Collection Time    10/09/12  9:58 PM      Result Value Range   Glucose-Capillary 150 (*) 70 - 99 mg/dL   No results found.  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 Does the need for close, 24 hr/day medical supervision in concert with the patient's rehab needs make it unreasonable for this patient to be served in a less intensive setting? Yes 2. Co-Morbidities requiring supervision/potential complications: pain, myelopathy related sequelae 3. Due to bladder management, bowel management, safety, skin/wound care, disease management, medication administration, pain management and patient education, does the patient require 24 hr/day rehab nursing? Yes 4. Does the patient require coordinated care of a physician, rehab nurse, PT (1-2 hrs/day, 5 days/week), OT (1-2 hrs/day, 5 days/week) and SLP (1-2 hrs/day, 5 days/week) to address physical and functional deficits in the context of the above medical diagnosis(es)? Yes Addressing deficits in the following areas: balance, endurance, locomotion, strength, transferring, bowel/bladder control, bathing, dressing, feeding, grooming, toileting, swallowing and psychosocial support 5. Can the patient actively participate in an intensive therapy program of at least 3 hrs of therapy per day at least 5 days per  week? Yes 6. The potential for patient to make measurable gains while on inpatient rehab is good 7. Anticipated functional outcomes upon discharge from inpatient rehab are supervision to min assist with PT, supervision to min assist with OT, mod I with SLP. 8. Estimated rehab length of stay to reach the above functional goals is: 3 weeks 9. Does the patient have adequate social supports to accommodate these discharge functional goals? Yes and Potentially 10. Anticipated D/C setting: Home 11. Anticipated post D/C treatments: Outpt therapy 12. Overall Rehab/Functional Prognosis: excellent  RECOMMENDATIONS: This patient's condition is appropriate for continued rehabilitative care in the following setting: CIR Patient has agreed to participate  in recommended program. Potentially Note that insurance prior authorization may be required for reimbursement for recommended care.  Comment: Girlfriend can apparently help at home. Will follow along.    Ranelle Oyster, MD, [facility] Physical Medicine & Rehabilitation     10/10/2012

## 2012-10-10 NOTE — Progress Notes (Signed)
Inpatient Diabetes Program Recommendations  AACE/ADA: New Consensus Statement on Inpatient Glycemic Control (2013)  Target Ranges:  Prepandial:   less than 140 mg/dL      Peak postprandial:   less than 180 mg/dL (1-2 hours)      Critically ill patients:  140 - 180 mg/dL   Reason for Visit: Results for RASOOL, ROMMEL (MRN [id number]) as of 10/10/2012 12:06  Ref. Range 10/09/2012 07:23 10/09/2012 11:57 10/09/2012 16:51 10/09/2012 21:58 10/10/2012 08:00  Glucose-Capillary Latest Range: 70-99 mg/dL 409 (H) 811 (H) 914 (H) 150 (H) 144 (H)   Referral received.  CBG's well controlled in ICU setting.  May consider checking A1C to determine pre-hospitalization glycemic control.  Note request for podiatrists.  Will need separate referral.  Discussed with RN.     Beryl Meager, RN, BC-ADM Inpatient Diabetes Coordinator Pager [phone]

## 2012-10-10 NOTE — Progress Notes (Signed)
I met with patient at bedside. I will contact workers Sport and exercise psychologist to seek approval for an inpt rehabilitation admission. [phone]

## 2012-10-11 ENCOUNTER — Inpatient Hospital Stay (HOSPITAL_COMMUNITY): Payer: BC Managed Care – PPO

## 2012-10-11 LAB — GLUCOSE, CAPILLARY
Glucose-Capillary: 148 mg/dL — ABNORMAL HIGH (ref 70–99)
Glucose-Capillary: 148 mg/dL — ABNORMAL HIGH (ref 70–99)

## 2012-10-11 NOTE — Progress Notes (Signed)
Patient ID: Dale Key, male   DOB: November 20, 1955, 57 y.o.   MRN: [id number] Stable. Able to swallow better. Drain out. Seen by rehab.

## 2012-10-11 NOTE — Progress Notes (Signed)
Speech Language Pathology Dysphagia Treatment Patient Details Name: Dale Key MRN: [id number] DOB: 05/28/1955 Today's Date: 10/11/2012 Time: 4098-1191 SLP Time Calculation (min): 15 min  Assessment / Plan / Recommendation Clinical Impression  Treatment session focused on reinforcement of best strategies to decrease risk/quantity of aspiration. Pt able to verbalize strategies using visual cue (sign on TV). Min cues required to slow rate and quantity of intake via straw (necessary due to physical limitations). Pt with watery eyes and cough when sips, bites of jello too large. Instructed pt to allow jello to melt prior to swallow as solids could not pass tight CP in yesterdays assessment. Pt following instructions well, will continue to need frequent reminders. Continue to suggest CXR given probable aspiration events.     Diet Recommendation  Continue with Current Diet: Thin liquid    SLP Plan     Pertinent Vitals/Pain NA   Swallowing Goals  SLP Swallowing Goals Patient will utilize recommended strategies during swallow to increase swallowing safety with: Minimal cueing Swallow Study Goal #2 - Progress: Progressing toward goal  General Temperature Spikes Noted: No Respiratory Status: Supplemental O2 delivered via (comment) Behavior/Cognition: Alert;Cooperative Oral Cavity - Dentition: Adequate natural dentition Patient Positioning: Upright in chair  Oral Cavity - Oral Hygiene Does patient have any of the following "at risk" factors?: Oxygen therapy - cannula, mask, simple oxygen devices Patient is HIGH RISK - Oral Care Protocol followed (see row info): Yes   Dysphagia Treatment Treatment focused on: Skilled observation of diet tolerance;Patient/family/caregiver education;Facilitation of pharyngeal phase;Utilization of compensatory strategies Treatment Methods/Modalities: Skilled observation Patient observed directly with PO's: Yes Type of PO's observed: Thin liquids Feeding:  Able to feed self;Needs assist Liquids provided via: Straw Pharyngeal Phase Signs & Symptoms: Watery eyes;Immediate cough;Immediate throat clear Type of cueing: Verbal Amount of cueing: Minimal   GO    [name], MA CCC-SLP [phone]  [name] 10/11/2012, 8:57 AM

## 2012-10-11 NOTE — Progress Notes (Signed)
Physical Therapy Treatment Patient Details Name: Dale Key MRN: [id number] DOB: July 08, 1955 Today's Date: 10/11/2012 Time: 8469-6295 PT Time Calculation (min): 26 min  PT Assessment / Plan / Recommendation  History of Present Illness pt presents with fall ~4days prior to admit.  pt found to have central cord compression and underwent C3-5 ACDF.     PT Comments   Pt admitted with above. Pt currently with functional limitations due to continued balance and endurance deficits.  Pt will benefit from skilled PT to increase their independence and safety with mobility to allow discharge to the venue listed below.   Follow Up Recommendations  CIR                 Equipment Recommendations  Rolling walker with 5" wheels;3in1 (PT)    Recommendations for Other Services Rehab consult  Frequency Min 5X/week   Progress towards PT Goals Progress towards PT goals: Progressing toward goals  Plan Current plan remains appropriate    Precautions / Restrictions Precautions Precautions: Cervical;Fall Restrictions Weight Bearing Restrictions: No   Pertinent Vitals/Pain VSS, No pain    Mobility  Bed Mobility Bed Mobility: Not assessed Transfers Transfers: Sit to Stand;Stand to Sit Sit to Stand: 4: Min assist;With upper extremity assist;With armrests;From chair/3-in-1 Stand to Sit: 4: Min assist;With upper extremity assist;To chair/3-in-1;With armrests Details for Transfer Assistance: vc for hand placement Ambulation/Gait Ambulation/Gait Assistance: 4: Min assist Ambulation Distance (Feet): 165 Feet Assistive device: Straight cane Ambulation/Gait Assistance Details: Pt ambulated with cane with a few LOB needing min assist.  Needs continued cues for sequencing cane and steps.  left foot continues to drag at times.  Gait Pattern: Step-through pattern;Decreased stride length;Left foot flat;Decreased step length - left Gait velocity: decreased Stairs: No Wheelchair Mobility Wheelchair  Mobility: No    Exercises General Exercises - Upper Extremity Shoulder Flexion: AAROM;Both;10 reps;Seated Shoulder ABduction: AROM;Both;10 reps;Seated Shoulder Horizontal ADduction: AROM;Both;10 reps;Seated Elbow Flexion: AAROM;Both;10 reps;Seated General Exercises - Lower Extremity Ankle Circles/Pumps: AROM;Both;10 reps;Seated Long Arc Quad: AROM;Both;10 reps;Seated Heel Slides: AROM;Both;10 reps;Supine Hip Flexion/Marching: AROM;Both;10 reps;Seated    PT Goals (current goals can now be found in the care plan section)    Visit Information  Last PT Received On: 10/11/12 Assistance Needed: +1 History of Present Illness: pt presents with fall ~4days prior to admit.  pt found to have central cord compression and underwent C3-5 ACDF.      Subjective Data  Subjective: "I think I am going to Rehab today."   Cognition  Cognition Arousal/Alertness: Awake/alert Behavior During Therapy: Flat affect Overall Cognitive Status: Within Functional Limits for tasks assessed    Balance  Static Sitting Balance Static Sitting - Level of Assistance: Not tested (comment) Static Standing Balance Static Standing - Balance Support: Right upper extremity supported;During functional activity Static Standing - Level of Assistance: 4: Min assist Static Standing - Comment/# of Minutes: Pt continues to need steadying assist initially when statically stnding until he gets his feet widened.  Pt notably widens his BOS to balance.   End of Session PT - End of Session Equipment Utilized During Treatment: Gait belt;Oxygen Activity Tolerance: Patient limited by fatigue Patient left: in chair;with call bell/phone within reach Nurse Communication: Mobility status        INGOLD,Dao Memmott 10/11/2012, 11:49 AM [facility] [phone] (pager)

## 2012-10-11 NOTE — Progress Notes (Signed)
Patient ID: Dale Key, male   DOB: Sep 20, 1955, 57 y.o.   MRN: [id number] To neuro floor. Insurance has not ok transfer to the rehabilitation unit which he needs

## 2012-10-11 NOTE — Progress Notes (Signed)
I met with pt at bedside. I explained that workers comp may deny his claim. If so, [employer] of [place] would be payor. Pt does not want BCBS filed. I am unable to admit to inpt rehab at this time until insurance clarified and approved. Encouraged pt to contact his employer which he last spoke with yesterday. I will follow up. [phone]

## 2012-10-11 NOTE — Plan of Care (Signed)
Problem: Consults Goal: Diagnosis - Spinal Surgery Outcome: Completed/Met Date Met:  10/11/12 Cervical Spine Fusion     

## 2012-10-12 LAB — GLUCOSE, CAPILLARY
Glucose-Capillary: 133 mg/dL — ABNORMAL HIGH (ref 70–99)
Glucose-Capillary: 190 mg/dL — ABNORMAL HIGH (ref 70–99)
Glucose-Capillary: 131 mg/dL — ABNORMAL HIGH (ref 70–99)

## 2012-10-12 MED ORDER — LATANOPROST 0.005 % OP SOLN
1.0000 [drp] | Freq: Every day | OPHTHALMIC | Status: DC
  Administered 2012-10-12: 1 [drp] via OPHTHALMIC
  Filled 2012-10-12: qty 2.5

## 2012-10-12 MED ORDER — BRINZOLAMIDE 1 % OP SUSP
1.0000 [drp] | Freq: Three times a day (TID) | OPHTHALMIC | Status: DC
  Administered 2012-10-12 – 2012-10-13 (×3): 1 [drp] via OPHTHALMIC
  Filled 2012-10-12 (×3): qty 10

## 2012-10-12 NOTE — Progress Notes (Signed)
Pt being transferred to 4N room 25. Report called to [place], Charity fundraiser. Family notified of transfer and new room number/phone number. All pt belongings sent with pt from room.   Delynn Flavin, RN

## 2012-10-12 NOTE — Progress Notes (Signed)
Occupational Therapy Treatment Patient Details Name: Dale Key MRN: [id number] DOB: [birth year] Today's Date: 10/12/2012 Time: 6440-3474 OT Time Calculation (min): 13 min  OT Assessment / Plan / Recommendation  History of present illness pt presents with fall ~4days prior to admit.  pt found to have central cord compression and underwent C3-5 ACDF.     OT comments  Pt progressing in functional use of UEs for self feeding and grooming.  Knowledgeable in use of foam build up for self feeding. Instructed pt that foam may also be used for toothbrush and pen/pencil.  Tolerating UE exercises well.  Pt focused on calling insurance company/lawyer to figure out payment for rehab.  Follow Up Recommendations  CIR    Barriers to Discharge       Equipment Recommendations   (TBA)    Recommendations for Other Services    Frequency Min 3X/week   Progress towards OT Goals Progress towards OT goals: Progressing toward goals  Plan Discharge plan remains appropriate    Precautions / Restrictions Precautions Precautions: Cervical;Fall Restrictions Weight Bearing Restrictions: No   Pertinent Vitals/Pain VSS, no c/o pain    ADL  Eating/Feeding: Set up (with foam build up, straws) Where Assessed - Eating/Feeding: Chair Grooming: Wash/dry hands;Minimal assistance Where Assessed - Grooming: Supported sitting Equipment Used: Gait belt ADL Comments: Pt using squeeze ball and hands during ADL. No putty available to instruct in home program.    OT Diagnosis:    OT Problem List:   OT Treatment Interventions:     OT Goals(current goals can now be found in the care plan section) Acute Rehab OT Goals Patient Stated Goal: Back to normal  Visit Information  Last OT Received On: 10/12/12 Assistance Needed: +1 History of Present Illness: pt presents with fall ~4days prior to admit.  pt found to have central cord compression and underwent C3-5 ACDF.      Subjective Data      Prior Functioning  Home Living Available Help at Discharge: Available 24 hours/day (sister and girlfriend available as needed)  Lives With: Alone Prior Function Comments: frequent falls pta?    Cognition  Cognition Arousal/Alertness: Awake/alert Behavior During Therapy: Flat affect Overall Cognitive Status: Within Functional Limits for tasks assessed    Mobility  Bed Mobility Bed Mobility: Not assessed Transfers Sit to Stand: 4: Min assist;With upper extremity assist;With armrests;From chair/3-in-1 Stand to Sit: 4: Min assist;With upper extremity assist;To chair/3-in-1;With armrests    Exercises  General Exercises - Upper Extremity Shoulder Flexion: AAROM;Both;10 reps;Seated Shoulder ABduction: AROM;Both;10 reps;Seated Shoulder Horizontal ADduction: AROM;Both;10 reps;Seated Elbow Flexion: AAROM;Both;10 reps;Seated   Balance Static Standing Balance Static Standing - Level of Assistance: 4: Min assist   End of Session OT - End of Session Activity Tolerance: Patient tolerated treatment well Patient left: in chair;with call bell/phone within reach  GO     Evern Bio 10/12/2012, 9:53 AM [phone]

## 2012-10-12 NOTE — Progress Notes (Signed)
Workers Youth worker under investigation per [employer] Patient is to meet with his lawyer at 4 pm today. Rehab venue decision pending today. [phone]

## 2012-10-12 NOTE — Progress Notes (Signed)
I met with patient at bedside to discuss possible admission to inpt rehab today. Workers compensation case yet to be determined. I have sought approval with [employer] of [place] and have approval. Patient would like to discuss with his lawyer before determining if he would like to admit to inpt rehab today with [facility] approval. I will follow up at 11 am. Please call me with any questions. [phone]

## 2012-10-12 NOTE — Progress Notes (Signed)
Patient ID: Dale Key, male   DOB: 1955-06-19, 57 y.o.   MRN: [id number] Stable. C/o being hungry. Agrees to go to the rahabilitation unit. Voice ok. Still with modified diet

## 2012-10-12 NOTE — Progress Notes (Signed)
Patient arrived from 3100. Report received from [place], [place]. Safety precautions reviewed with patient. Patient verbalized understanding the need to use call light for assistance. Will continue to monitor.  Sim Boast, RN, 10/12/12 1030

## 2012-10-12 NOTE — PMR Pre-admission (Signed)
PMR Admission Coordinator Pre-Admission Assessment  Patient: Dale Key is an 57 y.o., male MRN: [id number] DOB: 1955-01-26 Height: 5\' 9"  (175.3 cm) Weight: 138.1 kg (304 lb 7.3 oz)              Insurance Information HMO:     PPO: yes     PCP:      IPA:      80/20:      OTHER:  PRIMARY: BCBS [place]      Policy#: [id number]      Subscriber: pt CM Name: Lucrezia Europe      Phone#: [phone] ext [phone]     Fax#: [phone] update requested 72/53 Pre-Cert#: [id number]      Employer:  Benefits:  Phone #: [phone]     Name: 10/12/12 Eff. Date: 10/12/12     Deduct: $3500      Out of Pocket Max: $3500 which includes deductible      Life Max: none CIR: 100% after deductible      SNF: 100% after deductible 60 days max Outpatient: 100% after deductible     Co-Pay: 30 visits combined Home Health: 100% after deductible      Co-Pay: 30 visits combined DME: 100% after deductible     Co-Pay: none Providers: in network  SECONDARY:  [employer] is employer, Educational psychologist. Workers Therapist, sports is [employer], [phone] and she is aware we are admitting pt under his BCBS approval at this time. To date states WC case likely denied. Employer disputing if pt working at time of accident. Patient discussing with a lawyer 10/12/12. Workers Manufacturing systems engineer is [employer] [phone] and fax [phone]. I left her a message on 10/13/12. Pt's lawyer nor pt have given a statement to workers compensation at this time. 10/13/12.    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 Central Cord syndrome  History of Present Illness: Dale Key is a 57 y.o. right-handed male  with history of hypertension as well as diabetes mellitus and peripheral neuropathy. 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   Admitted 10/07/2012 after recent fall 4 days ago and struck his head. There was no loss of consciousness. Patient with complaints of tingling and numbness in his hands and legs as well as difficulty with ambulation. MRI C-spine showed acute cord contusion no epidural hematoma with severe stenosis at the level of C3-4 and 4-5. Underwent anterior cervical discectomy decompression of spinal cord interbody fusion 10/08/2012 per Dr. Jeral Fruit. Postoperative pain management. Decadron protocol as directed.   Patient presently on a clear liquid diet with followup speech therapy for questionable dysphagia.   Past Medical History  Past Medical History  Diagnosis Date  . Hypertension   . Diabetes mellitus without complication     Family History  family history is not on file.  Prior Rehab/Hospitalizations: none   Current Medications  Current facility-administered medications:acetaminophen (TYLENOL) suppository 650 mg, 650 mg, Rectal, Q4H PRN, Karn Cassis, MD;  acetaminophen (TYLENOL) tablet 650 mg, 650 mg, Oral, Q4H PRN, Karn Cassis, MD;  brinzolamide (AZOPT) 1 % ophthalmic suspension 1 drop, 1 drop, Both Eyes, TID, Karn Cassis, MD, 1  drop at 10/13/12 1125 dexamethasone (DECADRON) injection 4 mg, 4 mg, Intravenous, Q6H, Karn Cassis, MD, 4 mg at 10/13/12 0517;  dexamethasone (DECADRON) tablet 4 mg, 4 mg, Oral, Q6H, Karn Cassis, MD, 4 mg at 10/13/12 1124;  diazepam (VALIUM) tablet 5 mg, 5 mg, Oral, Q6H PRN, Karn Cassis, MD;  furosemide (LASIX) tablet 40 mg, 40 mg, Oral, Daily, Karn Cassis, MD, 40 mg at 10/13/12 1124 insulin aspart (novoLOG) injection 0-20 Units, 0-20 Units, Subcutaneous, TID WC, Karn Cassis, MD, 4 Units at 10/13/12 1200;  insulin aspart (novoLOG) injection 0-5 Units, 0-5 Units, Subcutaneous, QHS, Karn Cassis, MD;  latanoprost (XALATAN) 0.005 % ophthalmic solution 1 drop, 1 drop, Both Eyes, QHS, Karn Cassis, MD, 1 drop at 10/12/12 2232 losartan (COZAAR) tablet 100 mg, 100 mg, Oral, q morning - 10a, Karn Cassis, MD, 100 mg at 10/13/12 1124;  menthol-cetylpyridinium (CEPACOL) lozenge 3 mg, 1 lozenge, Oral, PRN, Karn Cassis, MD;  metFORMIN (GLUCOPHAGE) tablet 1,000 mg, 1,000 mg, Oral, BID WC, Karn Cassis, MD, 1,000 mg at 10/13/12 0839;  metoprolol succinate (TOPROL-XL) 24 hr tablet 50 mg, 50 mg, Oral, q morning - 10a, Karn Cassis, MD, 50 mg at 10/13/12 1124 morphine 2 MG/ML injection 1-4 mg, 1-4 mg, Intravenous, Q3H PRN, Karn Cassis, MD, 2 mg at 10/08/12 0224;  ondansetron (ZOFRAN) injection 4 mg, 4 mg, Intravenous, Q4H PRN, Karn Cassis, MD;  oxyCODONE-acetaminophen (PERCOCET/ROXICET) 5-325 MG per tablet 1-2 tablet, 1-2 tablet, Oral, Q4H PRN, Karn Cassis, MD;  pantoprazole (PROTONIX) injection 40 mg, 40 mg, Intravenous, QHS, Karn Cassis, MD, 40 mg at 10/12/12 2232 phenol (CHLORASEPTIC) mouth spray 1 spray, 1 spray, Mouth/Throat, PRN, Karn Cassis, MD, 1 spray at 10/12/12 0006;  potassium chloride SA (K-DUR,KLOR-CON) CR tablet 20 mEq, 20 mEq, Oral, Daily PRN, Karn Cassis, MD  Patients Current Diet: Clear Liquid some dysphagia noted, SLP recommending CXR for possible aspiration  Precautions / Restrictions Precautions Precautions: Cervical;Fall Restrictions Weight Bearing Restrictions: No   Prior Activity Level 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  Current Functional Level Cognition  Overall Cognitive Status: Within Functional Limits for tasks assessed Orientation Level: Oriented X4    Extremity  Assessment (includes Sensation/Coordination)          ADLs  Eating/Feeding: Set up (with foam build up, straws) Where Assessed - Eating/Feeding: Chair Grooming: Wash/dry hands;Minimal assistance Where Assessed - Grooming: Supported sitting Upper Body Bathing: Moderate assistance Where Assessed - Upper Body Bathing: Supported sitting Lower Body Bathing: Maximal assistance Where Assessed - Lower Body Bathing: Supported sit to stand Upper Body Dressing: Maximal assistance Where Assessed - Upper Body Dressing: Supported sitting Lower Body Dressing: Maximal assistance Where Assessed - Lower Body Dressing: Supported sit to Pharmacist, hospital: Moderate assistance;Other (comment) (sit - stand) Toileting - Clothing Manipulation and Hygiene: Moderate assistance (using urinal) Where Assessed - Toileting Clothing Manipulation and Hygiene: Sit to stand from 3-in-1 or toilet Equipment Used: Gait belt Transfers/Ambulation Related to ADLs: mod A from lower surface ADL Comments: Pt using squeeze ball and hands during ADL. No putty available to instruct in home program.    Mobility  Bed Mobility: Not assessed Supine to Sit: 3: Mod assist;HOB elevated;With rails Supine to Sit: Patient Percentage: 50% Sitting - Scoot to  Edge of Bed: 3: Mod assist    Transfers  Transfers: Sit to Stand;Stand to Sit Sit to Stand: With upper extremity assist;With armrests;From chair/3-in-1;4: Min guard Sit to Stand: Patient Percentage: 50% Stand to Sit: With upper extremity assist;To chair/3-in-1;With armrests;4: Min guard Stand to Sit: Patient Percentage: 60% Stand Pivot Transfers: Not tested (comment) Stand Pivot Transfers: Patient Percentage: 60%    Ambulation / Gait / Stairs / Wheelchair Mobility  Ambulation/Gait Ambulation/Gait Assistance: 4: Min guard Ambulation Distance (Feet): 350 Feet Assistive device: Straight cane Ambulation/Gait Assistance Details: Pt ambulated with cane with continued  improvements in balance and endurance.  Continues at times to need cues to take bigger step with left LE but was not dragging the foot and had no LOB.   Gait Pattern: Step-through pattern;Decreased stride length;Left foot flat;Decreased step length - left Gait velocity: decreased Stairs: No Wheelchair Mobility Wheelchair Mobility: No    Posture / Games developer Sitting - Balance Support: Bilateral upper extremity supported;Feet supported Static Sitting - Level of Assistance: Not tested (comment) Static Sitting - Comment/# of Minutes: 2 Static Standing Balance Static Standing - Balance Support: Right upper extremity supported;During functional activity Static Standing - Level of Assistance: 5: Stand by assistance Static Standing - Comment/# of Minutes: Pt needed steadying assist upon initial stand when he first gets up.      Special needs/care consideration Oxygen nasal cannula at 4 liters Bowel mgmt: smear 10/12/12 Bladder mgmt: continent Diabetic mgmt prn insulin   Previous Home Environment Living Arrangements: Alone  Lives With: Alone Available Help at Discharge: Available 24 hours/day (sister and girlfriend available as needed) Type of Home: House Home Layout: One level Bathroom Shower/Tub: Engineer, manufacturing systems: Standard Bathroom Accessibility: Yes How Accessible: Accessible via walker Home Care Services: No  Discharge Living Setting Plans for Discharge Living Setting: Patient's home;Alone Type of Home at Discharge: House Discharge Home Layout: One level Discharge Bathroom Shower/Tub: Tub/shower unit Discharge Bathroom Toilet: Standard Discharge Bathroom Accessibility: Yes How Accessible: Accessible via walker Does the patient have any problems obtaining your medications?: No Patient states he likely will go stay with his girlfriend, Stanton Kidney, at d/c and then sister can also assist.  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;Other (Comment) (employee) Contact Information: Virginia Crews, sister Anticipated Caregiver: sister and girlfriend to assist Anticipated Caregiver's Contact Information: sister [phone] Ability/Limitations of Caregiver: none per pt Caregiver Availability: Other (Comment) (pt reports as he needs) Discharge Plan Discussed with Primary Caregiver: No Is Caregiver In Agreement with Plan?: Yes Does Caregiver/Family have Issues with Lodging/Transportation while Pt is in Rehab?: No  Goals/Additional Needs Patient/Family Goal for Rehab: supervision to min assist with PT and OT, Mod I with SLP Expected length of stay: ELOS 7 days Dietary Needs: clear liquid diet Pt/Family Agrees to Admission and willing to participate: Yes Program Orientation Provided & Reviewed with Pt/Caregiver Including Roles  & Responsibilities: Yes Additional Information Needs: Patient wanted insurance to be filed under workers compensation, yet to be determinined with his employer. patient has a Clinical research associate to discuss with   Decrease burden of Care through IP rehab admission: n/a  Possible need for SNF placement upon discharge:not anticipated  Patient Condition: This patient's medical and functional status has changed since the consult dated: 10/10/12 in which the Rehabilitation Physician determined and documented that the patient's condition is appropriate for intensive rehabilitative care in an inpatient rehabilitation facility. See "History of Present Illness" (above) for medical update. Functional changes are: overall min to mod assist with  some dysphagia. Patient's medical and functional status update has been discussed with the Rehabilitation physician and patient remains appropriate for inpatient rehabilitation. Will admit to inpatient rehab today.  Preadmission Screen Completed By:  Clois Dupes, 10/13/2012 1:30 PM ______________________________________________________________________   Discussed status  with Dr. Wynn Banker on 10/13/12 at  1329 and received telephone approval for admission today.  Admission Coordinator:  Clois Dupes, time 1610 Date 10/13/2012.

## 2012-10-12 NOTE — Progress Notes (Signed)
Physical Therapy Treatment Patient Details Name: Dale Key MRN: [id number] DOB: August 10, 1955 Today's Date: 10/12/2012 Time: 9562-1308 PT Time Calculation (min): 27 min  PT Assessment / Plan / Recommendation  History of Present Illness pt presents with fall ~4days prior to admit.  pt found to have central cord compression and underwent C3-5 ACDF.     PT Comments   Pt admitted with above. Pt currently with functional limitations due to continued balance and endurance deficits that are improving daily.  Continues with bil UE weakness limiting pt with ADL's.  Pt will benefit from skilled PT to increase their independence and safety with mobility to allow discharge to the venue listed below.  Follow Up Recommendations  CIR                 Equipment Recommendations  Rolling walker with 5" wheels;3in1 (PT)    Recommendations for Other Services Rehab consult  Frequency Min 5X/week   Progress towards PT Goals Progress towards PT goals: Progressing toward goals  Plan Current plan remains appropriate    Precautions / Restrictions Precautions Precautions: Cervical;Fall Restrictions Weight Bearing Restrictions: No   Pertinent Vitals/Pain VSS, no pain    Mobility  Bed Mobility Bed Mobility: Not assessed Transfers Sit to Stand: With upper extremity assist;With armrests;From chair/3-in-1;4: Min guard Stand to Sit: With upper extremity assist;To chair/3-in-1;With armrests;4: Min guard Details for Transfer Assistance: vc for hand placement Ambulation/Gait Ambulation/Gait Assistance: 4: Min guard Ambulation Distance (Feet): 350 Feet Assistive device: Straight cane Ambulation/Gait Assistance Details: Pt ambulated with cane with continued improvements in balance and endurance.  Continues at times to need cues to take bigger step with left LE but was not dragging the foot and had no LOB.   Gait Pattern: Step-through pattern;Decreased stride length;Left foot flat;Decreased step length -  left Gait velocity: decreased Stairs: No Wheelchair Mobility Wheelchair Mobility: No    Exercises General Exercises - Upper Extremity Shoulder Flexion: AAROM;Both;10 reps;Seated Shoulder ABduction: AROM;Both;10 reps;Seated Shoulder Horizontal ADduction: AROM;Both;10 reps;Seated Elbow Flexion: AAROM;Both;10 reps;Seated General Exercises - Lower Extremity Ankle Circles/Pumps: AROM;Both;10 reps;Seated Long Arc Quad: AROM;Both;10 reps;Seated Heel Slides: AROM;Both;10 reps;Supine Hip Flexion/Marching: AROM;Both;10 reps;Seated    PT Goals (current goals can now be found in the care plan section) Acute Rehab PT Goals Patient Stated Goal: Back to normal  Visit Information  Last PT Received On: 10/12/12 Assistance Needed: +1 History of Present Illness: pt presents with fall ~4days prior to admit.  pt found to have central cord compression and underwent C3-5 ACDF.      Subjective Data  Subjective: "I don't know why I have to stand a minute when I first stand up." Patient Stated Goal: Back to normal   Cognition  Cognition Arousal/Alertness: Awake/alert Behavior During Therapy: Flat affect Overall Cognitive Status: Within Functional Limits for tasks assessed    Balance  Static Sitting Balance Static Sitting - Level of Assistance: Not tested (comment) Static Standing Balance Static Standing - Balance Support: Right upper extremity supported;During functional activity Static Standing - Level of Assistance: 5: Stand by assistance Static Standing - Comment/# of Minutes: Pt needed steadying assist upon initial stand when he first gets up.    End of Session PT - End of Session Equipment Utilized During Treatment: Gait belt;Oxygen Activity Tolerance: Patient limited by fatigue Patient left: in chair;with call bell/phone within reach Nurse Communication: Mobility status        INGOLD,Evin Chirco 10/12/2012, 11:29 AM  [facility] [phone]  (pager)

## 2012-10-13 ENCOUNTER — Inpatient Hospital Stay (HOSPITAL_COMMUNITY)
Admission: RE | Admit: 2012-10-13 | Discharge: 2012-10-20 | DRG: 462 | Disposition: A | Discharge status: Home / Self Care | Payer: BC Managed Care – PPO | Source: Intra-hospital | Attending: Physical Medicine & Rehabilitation | Admitting: Physical Medicine & Rehabilitation

## 2012-10-13 DIAGNOSIS — S14129A Central cord syndrome at unspecified level of cervical spinal cord, initial encounter: Secondary | ICD-10-CM

## 2012-10-13 DIAGNOSIS — G8250 Quadriplegia, unspecified: Secondary | ICD-10-CM

## 2012-10-13 DIAGNOSIS — R1310 Dysphagia, unspecified: Secondary | ICD-10-CM

## 2012-10-13 DIAGNOSIS — M4712 Other spondylosis with myelopathy, cervical region: Secondary | ICD-10-CM

## 2012-10-13 DIAGNOSIS — S14121A Central cord syndrome at C1 level of cervical spinal cord, initial encounter: Secondary | ICD-10-CM

## 2012-10-13 DIAGNOSIS — E1149 Type 2 diabetes mellitus with other diabetic neurological complication: Secondary | ICD-10-CM

## 2012-10-13 DIAGNOSIS — Z79899 Other long term (current) drug therapy: Secondary | ICD-10-CM

## 2012-10-13 DIAGNOSIS — S14125A Central cord syndrome at C5 level of cervical spinal cord, initial encounter: Secondary | ICD-10-CM

## 2012-10-13 DIAGNOSIS — I10 Essential (primary) hypertension: Secondary | ICD-10-CM

## 2012-10-13 DIAGNOSIS — B351 Tinea unguium: Secondary | ICD-10-CM

## 2012-10-13 DIAGNOSIS — E1142 Type 2 diabetes mellitus with diabetic polyneuropathy: Secondary | ICD-10-CM

## 2012-10-13 DIAGNOSIS — N400 Enlarged prostate without lower urinary tract symptoms: Secondary | ICD-10-CM

## 2012-10-13 DIAGNOSIS — Z981 Arthrodesis status: Secondary | ICD-10-CM

## 2012-10-13 DIAGNOSIS — Z5189 Encounter for other specified aftercare: Principal | ICD-10-CM

## 2012-10-13 DIAGNOSIS — W19XXXA Unspecified fall, initial encounter: Secondary | ICD-10-CM

## 2012-10-13 DIAGNOSIS — Z87891 Personal history of nicotine dependence: Secondary | ICD-10-CM

## 2012-10-13 LAB — GLUCOSE, CAPILLARY
Glucose-Capillary: 151 mg/dL — ABNORMAL HIGH (ref 70–99)
Glucose-Capillary: 171 mg/dL — ABNORMAL HIGH (ref 70–99)
Glucose-Capillary: 251 mg/dL — ABNORMAL HIGH (ref 70–99)
Glucose-Capillary: 170 mg/dL — ABNORMAL HIGH (ref 70–99)

## 2012-10-13 MED ORDER — LOSARTAN POTASSIUM 50 MG PO TABS: 100.0000 mg | ORAL_TABLET | Freq: Every morning | ORAL | Status: DC

## 2012-10-13 MED ORDER — DEXAMETHASONE SODIUM PHOSPHATE 4 MG/ML IJ SOLN
4.0000 mg | Freq: Four times a day (QID) | INTRAMUSCULAR | Status: DC
  Filled 2012-10-13 (×11): qty 1

## 2012-10-13 MED ORDER — ACETAMINOPHEN 650 MG RE SUPP: 650.0000 mg | RECTAL | Status: DC | PRN

## 2012-10-13 MED ORDER — DEXAMETHASONE 4 MG PO TABS
4.0000 mg | ORAL_TABLET | Freq: Four times a day (QID) | ORAL | Status: DC
  Administered 2012-10-13 – 2012-10-15 (×7): 4 mg via ORAL
  Filled 2012-10-13 (×11): qty 1

## 2012-10-13 MED ORDER — OXYCODONE-ACETAMINOPHEN 5-325 MG PO TABS
1.0000 | ORAL_TABLET | ORAL | Status: DC | PRN
  Administered 2012-10-13: 1 via ORAL
  Administered 2012-10-14 – 2012-10-17 (×3): 2 via ORAL
  Filled 2012-10-13 (×2): qty 2
  Filled 2012-10-13: qty 1
  Filled 2012-10-13 (×3): qty 2

## 2012-10-13 MED ORDER — INSULIN ASPART 100 UNIT/ML ~~LOC~~ SOLN
0.0000 [IU] | Freq: Three times a day (TID) | SUBCUTANEOUS | Status: DC
  Administered 2012-10-13: 11 [IU] via SUBCUTANEOUS
  Administered 2012-10-14 (×3): 4 [IU] via SUBCUTANEOUS
  Administered 2012-10-15 (×2): 7 [IU] via SUBCUTANEOUS
  Administered 2012-10-15: 11 [IU] via SUBCUTANEOUS
  Administered 2012-10-16: 7 [IU] via SUBCUTANEOUS
  Administered 2012-10-16: 4 [IU] via SUBCUTANEOUS
  Administered 2012-10-16: 11 [IU] via SUBCUTANEOUS
  Administered 2012-10-17 (×2): 4 [IU] via SUBCUTANEOUS
  Administered 2012-10-17: 7 [IU] via SUBCUTANEOUS
  Administered 2012-10-18: 11 [IU] via SUBCUTANEOUS
  Administered 2012-10-18: 7 [IU] via SUBCUTANEOUS
  Administered 2012-10-18 – 2012-10-19 (×3): 4 [IU] via SUBCUTANEOUS
  Administered 2012-10-19: 3 [IU] via SUBCUTANEOUS
  Administered 2012-10-20: 11 [IU] via SUBCUTANEOUS

## 2012-10-13 MED ORDER — FUROSEMIDE 40 MG PO TABS
40.0000 mg | ORAL_TABLET | Freq: Every day | ORAL | Status: DC
  Administered 2012-10-14 – 2012-10-20 (×7): 40 mg via ORAL
  Filled 2012-10-13 (×8): qty 1

## 2012-10-13 MED ORDER — INFLUENZA VAC SPLIT QUAD 0.5 ML IM SUSP
0.5000 mL | INTRAMUSCULAR | Status: AC
  Administered 2012-10-15: 0.5 mL via INTRAMUSCULAR
  Filled 2012-10-13: qty 0.5

## 2012-10-13 MED ORDER — SORBITOL 70 % SOLN
30.0000 mL | Freq: Every day | Status: DC | PRN
  Administered 2012-10-15: 30 mL via ORAL
  Filled 2012-10-13: qty 30

## 2012-10-13 MED ORDER — METOPROLOL SUCCINATE ER 50 MG PO TB24
50.0000 mg | ORAL_TABLET | Freq: Every day | ORAL | Status: DC
  Administered 2012-10-14 – 2012-10-20 (×7): 50 mg via ORAL
  Filled 2012-10-13 (×8): qty 1

## 2012-10-13 MED ORDER — METHOCARBAMOL 500 MG PO TABS
500.0000 mg | ORAL_TABLET | Freq: Four times a day (QID) | ORAL | Status: DC | PRN
  Filled 2012-10-13: qty 1

## 2012-10-13 MED ORDER — LOSARTAN POTASSIUM 50 MG PO TABS
100.0000 mg | ORAL_TABLET | Freq: Every day | ORAL | Status: DC
  Administered 2012-10-14 – 2012-10-20 (×7): 100 mg via ORAL
  Filled 2012-10-13 (×8): qty 2

## 2012-10-13 MED ORDER — ONDANSETRON HCL 4 MG/2ML IJ SOLN: 4.0000 mg | Freq: Four times a day (QID) | INTRAMUSCULAR | Status: DC | PRN

## 2012-10-13 MED ORDER — PANTOPRAZOLE SODIUM 40 MG IV SOLR
40.0000 mg | Freq: Every day | INTRAVENOUS | Status: DC
  Filled 2012-10-13: qty 40

## 2012-10-13 MED ORDER — ACETAMINOPHEN 325 MG PO TABS: 650.0000 mg | ORAL_TABLET | ORAL | Status: DC | PRN

## 2012-10-13 MED ORDER — ONDANSETRON HCL 4 MG PO TABS: 4.0000 mg | ORAL_TABLET | Freq: Four times a day (QID) | ORAL | Status: DC | PRN

## 2012-10-13 MED ORDER — POTASSIUM CHLORIDE CRYS ER 20 MEQ PO TBCR
20.0000 meq | EXTENDED_RELEASE_TABLET | Freq: Every day | ORAL | Status: DC | PRN
  Filled 2012-10-13: qty 1

## 2012-10-13 MED ORDER — PANTOPRAZOLE SODIUM 40 MG PO TBEC
40.0000 mg | DELAYED_RELEASE_TABLET | Freq: Every day | ORAL | Status: DC
  Administered 2012-10-13 – 2012-10-19 (×7): 40 mg via ORAL
  Filled 2012-10-13 (×7): qty 1

## 2012-10-13 MED ORDER — BRINZOLAMIDE 1 % OP SUSP
1.0000 [drp] | Freq: Three times a day (TID) | OPHTHALMIC | Status: DC
  Administered 2012-10-13 – 2012-10-20 (×20): 1 [drp] via OPHTHALMIC
  Filled 2012-10-13: qty 10

## 2012-10-13 MED ORDER — METFORMIN HCL 500 MG PO TABS
1000.0000 mg | ORAL_TABLET | Freq: Two times a day (BID) | ORAL | Status: DC
  Administered 2012-10-13 – 2012-10-20 (×14): 1000 mg via ORAL
  Filled 2012-10-13 (×16): qty 2

## 2012-10-13 MED ORDER — LATANOPROST 0.005 % OP SOLN
1.0000 [drp] | Freq: Every day | OPHTHALMIC | Status: DC
  Administered 2012-10-13 – 2012-10-19 (×7): 1 [drp] via OPHTHALMIC
  Filled 2012-10-13 (×2): qty 2.5

## 2012-10-13 NOTE — H&P (Signed)
Physical Medicine and Rehabilitation Admission H&P  Chief Complaint   Patient presents with   .  Extremity Weakness   .  Numbness   :  HPI: Dale Key is a 57 y.o. right-handed male with history of hypertension as well as diabetes mellitus and peripheral neuropathy. 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 There was no loss of consciousness. Patient with complaints of tingling and numbness in his hands and legs as well as difficulty with ambulation. MRI C-spine showed acute cord contusion no epidural hematoma with severe stenosis at the level of C3-4 and 4-5. Underwent anterior cervical discectomy decompression of spinal cord interbody fusion 10/08/2012 per Dr. Jeral Fruit. Postoperative pain management. Decadron protocol as directed.  Patient presently on a clear liquid diet with followup speech therapy for questionable chronic dysphagia. Physical therapy evaluation completed 10/08/2012 with recommendations for physical medicine rehabilitation consult to consider inpatient rehabilitation services. Patient was felt to be a good candidate for inpatient rehabilitation services and was admitted for comprehensive rehabilitation program  Review of Systems  Musculoskeletal:  Recent fall 4 days prior to admission  Neurological: Positive for tingling and weakness.  All other systems reviewed and are negative  Past Medical History   Diagnosis  Date   .  Hypertension    .  Diabetes mellitus without complication     Past Surgical History   Procedure  Laterality  Date   .  Eye surgery      No family history on file.  Social History: reports that he quit smoking about 8 months ago. He does not have any smokeless tobacco history on file. He reports that drinks alcohol. His drug history is not on file.  Allergies: No Known Allergies  Medications Prior to Admission   Medication  Sig  Dispense  Refill   .   brinzolamide (AZOPT) 1 % ophthalmic suspension  Place 1 drop into both eyes 3 (three) times daily.     .  furosemide (LASIX) 40 MG tablet  Take 40 mg by mouth daily as needed for fluid.     Marland Kitchen  losartan (COZAAR) 100 MG tablet  Take 100 mg by mouth every morning.     .  meloxicam (MOBIC) 15 MG tablet  Take 15 mg by mouth daily as needed for pain.     .  metFORMIN (GLUCOPHAGE) 1000 MG tablet  Take 1,000 mg by mouth 2 (two) times daily with a meal.     .  metoprolol succinate (TOPROL-XL) 50 MG 24 hr tablet  Take 50 mg by mouth every morning. Take with or immediately following a meal.     .  Multiple Vitamin (MULTIVITAMIN WITH MINERALS) TABS tablet  Take 1 tablet by mouth every morning.     .  potassium chloride SA (K-DUR,KLOR-CON) 20 MEQ tablet  Take 20 mEq by mouth daily as needed (For potassium loss.).     Marland Kitchen  travoprost, benzalkonium, (TRAVATAN) 0.004 % ophthalmic solution  Place 1 drop into both eyes at bedtime.     .  traMADol (ULTRAM) 50 MG tablet  Take 1 tablet (50 mg total) by mouth every 6 (six) hours as needed for pain.  20 tablet  0    Home:  Home Living  Family/patient expects to be discharged to:: Private residence  Living Arrangements: Alone  Available Help at Discharge: Friend(s) (Near 24hr per pt)  Type of Home: House  Home Layout: One  level  Home Equipment: None  Functional History:   Functional Status:  Mobility:  Bed Mobility  Bed Mobility: Not assessed  Supine to Sit: 3: Mod assist;HOB elevated;With rails  Supine to Sit: Patient Percentage: 50%  Sitting - Scoot to Edge of Bed: 3: Mod assist  Transfers  Transfers: Sit to Stand;Stand to Sit  Sit to Stand: 3: Mod assist;From chair/3-in-1  Sit to Stand: Patient Percentage: 50%  Stand to Sit: 4: Min assist;With upper extremity assist;To chair/3-in-1;With armrests  Stand to Sit: Patient Percentage: 60%  Stand Pivot Transfers: Not tested (comment)  Stand Pivot Transfers: Patient Percentage: 60%  Ambulation/Gait   Ambulation/Gait Assistance: 4: Min assist  Ambulation Distance (Feet): 110 Feet  Assistive device: Rolling walker;Straight cane  Ambulation/Gait Assistance Details: Pt ambulated with RW initially with fair safety. Pt kept picking RW up and carrying it instead of pushing it. Needed incr cues for safety. Used cane but pt needed cues for sequencing cane and steps. Needs continued practice with devices. Occasionally dragging left LE.  Gait Pattern: Step-through pattern;Decreased stride length;Left foot flat;Decreased step length - left  Gait velocity: decreased  Stairs: No  Wheelchair Mobility  Wheelchair Mobility: No  ADL:  ADL  Eating/Feeding: Minimal assistance;Other (comment) (modified utensil with built up long grip/theratubing. Only)  Grooming: Moderate assistance  Where Assessed - Grooming: Supported sitting  Upper Body Bathing: Moderate assistance  Where Assessed - Upper Body Bathing: Supported sitting  Lower Body Bathing: Maximal assistance  Where Assessed - Lower Body Bathing: Supported sit to stand  Upper Body Dressing: Maximal assistance  Where Assessed - Upper Body Dressing: Supported sitting  Lower Body Dressing: Maximal assistance  Where Assessed - Lower Body Dressing: Supported sit to Scientist, research (life sciences): Moderate assistance;Other (comment) (sit - stand)  Transfers/Ambulation Related to ADLs: mod A from lower surface  ADL Comments: significant decrease in funciton  Cognition:  Cognition  Overall Cognitive Status: Within Functional Limits for tasks assessed  Orientation Level: Oriented X4  Cognition  Arousal/Alertness: Awake/alert  Behavior During Therapy: Flat affect  Overall Cognitive Status: Within Functional Limits for tasks assessed  Physical Exam:  Blood pressure 113/64, pulse 61, temperature 98.5 F (36.9 C), temperature source Oral, resp. rate 15, height 5\' 9"  (1.753 m), weight 138.1 kg (304 lb 7.3 oz), SpO2 93.00%.  Constitutional: He is oriented to  person, place, and time.  HENT: perrl  Head: Normocephalic.  Eyes: EOM are normal.  Neck: No thyromegaly present.  Cardiovascular: Normal rate and regular rhythm. No murmur or gallops  Pulmonary/Chest: Effort normal and breath sounds normal. No respiratory distress. No wheezes  Abdominal: Soft. Bowel sounds are normal. He exhibits no distension.  Obese  Neurological: He is alert and oriented to person, place, and time. Normal sensation over the clavicle. Decreased sensation over the deltoid with hyperesthesia Decreased FT/pain in hands/fingers. RUE 2-/5 shoulder, 2- to 3/5 distally. LUE is 3- to 3 deltoid, bicep, tricep, hand. RLE is 3- prox at HF, 4-KE, 3 ankle. LLE is her/5 in the hip flexor knee extensor ankle dorsiflexor. DTR's 1+. Seems distracted, fair insight and awareness  Skin:  Surgical site clean and dry with dressing intact  Psychiatric: He has a normal mood and affect.  A little difficult to redirect. Decreased insight and awareness  Results for orders placed during the hospital encounter of 10/07/12 (from the past 48 hour(s))   GLUCOSE, CAPILLARY Status: Abnormal    Collection Time    10/09/12 7:23 AM   Result  Value  Range    Glucose-Capillary  163 (*)  70 - 99 mg/dL   GLUCOSE, CAPILLARY Status: Abnormal    Collection Time    10/09/12 11:57 AM   Result  Value  Range    Glucose-Capillary  148 (*)  70 - 99 mg/dL   GLUCOSE, CAPILLARY Status: Abnormal    Collection Time    10/09/12 4:51 PM   Result  Value  Range    Glucose-Capillary  145 (*)  70 - 99 mg/dL   GLUCOSE, CAPILLARY Status: Abnormal    Collection Time    10/09/12 9:58 PM   Result  Value  Range    Glucose-Capillary  150 (*)  70 - 99 mg/dL   GLUCOSE, CAPILLARY Status: Abnormal    Collection Time    10/10/12 8:00 AM   Result  Value  Range    Glucose-Capillary  144 (*)  70 - 99 mg/dL   GLUCOSE, CAPILLARY Status: Abnormal    Collection Time    10/10/12 11:53 AM   Result  Value  Range    Glucose-Capillary   161 (*)  70 - 99 mg/dL   GLUCOSE, CAPILLARY Status: Abnormal    Collection Time    10/10/12 5:21 PM   Result  Value  Range    Glucose-Capillary  153 (*)  70 - 99 mg/dL    Comment 1  Notify RN     Comment 2  Documented in Chart    GLUCOSE, CAPILLARY Status: Abnormal    Collection Time    10/10/12 8:17 PM   Result  Value  Range    Glucose-Capillary  151 (*)  70 - 99 mg/dL    Comment 1  Notify RN     Comment 2  Documented in Chart    GLUCOSE, CAPILLARY Status: Abnormal    Collection Time    10/10/12 9:53 PM   Result  Value  Range    Glucose-Capillary  154 (*)  70 - 99 mg/dL    Comment 1  Documented in Chart     Comment 2  Notify RN     No results found.  Post Admission Physician Evaluation:  1. Functional deficits secondary to C3-5 cord contusion s/p decompression and fusion. 2. Patient is admitted to receive collaborative, interdisciplinary care between the physiatrist, rehab nursing staff, and therapy team. 3. Patient's level of medical complexity and substantial therapy needs in context of that medical necessity cannot be provided at a lesser intensity of care such as a SNF. 4. Patient has experienced substantial functional loss from his/her baseline which was documented above under the "Functional History" and "Functional Status" headings. Judging by the patient's diagnosis, physical exam, and functional history, the patient has potential for functional progress which will result in measurable gains while on inpatient rehab. These gains will be of substantial and practical use upon discharge in facilitating mobility and self-care at the household level. 5. Physiatrist will provide 24 hour management of medical needs as well as oversight of the therapy plan/treatment and provide guidance as appropriate regarding the interaction of the two. 6. 24 hour rehab nursing will assist with bladder management, bowel management, safety, skin/wound care, disease management, medication  administration, pain management and patient education and help integrate therapy concepts, techniques,education, etc. 7. PT will assess and treat for/with: Lower extremity strength, range of motion, stamina, balance, functional mobility, safety, adaptive techniques and equipment, NMR, pain mgt, orthotics. Goals are: supervision to minimal assist. 8. OT will assess and treat for/with: ADL's,  functional mobility, safety, upper extremity strength, adaptive techniques and equipment, NMR, pain mgt, education. Goals are: min assist +. 9. SLP will assess and treat for/with: n/a. Goals are: n/a. 10. Case Management and Social Worker will assess and treat for psychological issues and discharge planning. 11. Team conference will be held weekly to assess progress toward goals and to determine barriers to discharge. 12. Patient will receive at least 3 hours of therapy per day at least 5 days per week. 13. ELOS: 3-4 weeks  14. Prognosis: good Medical Problem List and Plan:  1. Cervical stenosis with myelopathy/central cord injury. Status post C3, 4-5 decompression and fusion  2. DVT Prophylaxis/Anticoagulation: SCDs. Patient is ambulatory.  3. Pain Management: Percocet, Robaxin as needed. Monitor with increased mobility  4. Neuropsych: This patient is capable of making decisions on his own behalf.  5. Diabetes mellitus with peripheral neuropathy. Blood sugar 140 to 160s. Monitor while on Decadron taper. Continue Glucophage 1000 mg twice a day  6. Hypertension. Lasix 40 mg daily, Cozaar 100 mg daily, Toprol-XL 50 mg daily. Monitor with increased mobility  7. Question chronic dysphasia. Followup speech therapy. Advance diet as tolerated. Monitor for any signs of aspiration   Erick Colace M.D.  Physical Med and Rehab FAAPM&R (Sports Med, Neuromuscular Med) Diplomate Am Board of Electrodiagnostic Med Diplomate Am Board of Pain Medicine Fellow Am Board of Interventional Pain Physicians    10/13/2012

## 2012-10-13 NOTE — Discharge Summary (Signed)
Physician Discharge Summary  Patient ID: Dale Key MRN: [id number] DOB/AGE: [birth year] 57 y.o.  Admit date: 10/07/2012 Discharge date: 10/13/2012  Admission Diagnoses:c34,45 stenosis with central cord injury. Large goiter  Discharge Diagnoses:same  Active Problems:   * No active hospital problems. *   Discharged Condition: [facility] Course:surgery Consults: rehabilitation medicine  Significant Diagnostic Studies: mri  Treatmentscervical fusion  Discharge Exam: Blood pressure 135/69, pulse 66, temperature 98.5 F (36.9 C), temperature source Oral, resp. rate 18, height 5\' 9"  (1.753 m), weight 138.1 kg (304 lb 7.3 oz), SpO2 98.00%. Weakness in upper and lower extremities  Disposition: to rehabilitation     Medication List    ASK your doctor about these medications       brinzolamide 1 % ophthalmic suspension  Commonly known as:  AZOPT  Place 1 drop into both eyes 3 (three) times daily.     furosemide 40 MG tablet  Commonly known as:  LASIX  Take 40 mg by mouth daily as needed for fluid.     losartan 100 MG tablet  Commonly known as:  COZAAR  Take 100 mg by mouth every morning.     meloxicam 15 MG tablet  Commonly known as:  MOBIC  Take 15 mg by mouth daily as needed for pain.     metFORMIN 1000 MG tablet  Commonly known as:  GLUCOPHAGE  Take 1,000 mg by mouth 2 (two) times daily with a meal.     metoprolol succinate 50 MG 24 hr tablet  Commonly known as:  TOPROL-XL  Take 50 mg by mouth every morning. Take with or immediately following a meal.     multivitamin with minerals Tabs tablet  Take 1 tablet by mouth every morning.     potassium chloride SA 20 MEQ tablet  Commonly known as:  K-DUR,KLOR-CON  Take 20 mEq by mouth daily as needed (For potassium loss.).     traMADol 50 MG tablet  Commonly known as:  ULTRAM  Take 1 tablet (50 mg total) by mouth every 6 (six) hours as needed for pain.     travoprost (benzalkonium) 0.004 % ophthalmic  solution  Commonly known as:  TRAVATAN  Place 1 drop into both eyes at bedtime.         Signed: Karn Cassis 10/13/2012, 1:13 PM

## 2012-10-13 NOTE — Progress Notes (Signed)
Pt. Arrived at 1515 from [facility] with RN. Reviewed safety plan, fall prevention plan (with signature) and plan of care with verbal understanding. Call bell within reach, urinal within reach and patient resting comfortably in recliner.

## 2012-10-13 NOTE — Progress Notes (Signed)
Speech Language Pathology Dysphagia Treatment Patient Details Name: Dale Key MRN: [id number] DOB: 11/20/1955 Today's Date: 10/13/2012 Time: 7829-5621 SLP Time Calculation (min): 16 min  Assessment / Plan / Recommendation Clinical Impression  Pt seen due to RN calling SLP to say pt is hungry wanting solid food. Pts orders had discharged for SLP services, possibly due to yesterdays transfer to [facility]. RN placed new SLP orders to continue therapy. Pt now sustaining good O2 saturations, consuming large consecutive sips against recommendations, though with no evidence of aspiration. No wet vocal quality or multiple swallows now observed. Trials of regular solids and purees also appeared functional with no signs of pharyngeal residuals. Suspect that with time, edema has improved and pt now able to pass bolus through CP segment without residuals. Recommend upgrade to regular diet and thin liquids, following solids with liquids to assist in bolus transit. CIR SLP will f/u tomorrow for assessment/tolerance.     Diet Recommendation  Initiate / Change Diet: Regular;Thin liquid    SLP Plan Continue with current plan of care   Pertinent Vitals/Pain NA   Swallowing Goals  SLP Swallowing Goals Patient will consume recommended diet without observed clinical signs of aspiration with: Supervision/safety Swallow Study Goal #1 - Progress: Progressing toward goal Patient will utilize recommended strategies during swallow to increase swallowing safety with: Minimal cueing Swallow Study Goal #2 - Progress: Progressing toward goal  General Temperature Spikes Noted: No Respiratory Status: Room air Behavior/Cognition: Alert;Cooperative Oral Cavity - Dentition: Adequate natural dentition Patient Positioning: Upright in chair  Oral Cavity - Oral Hygiene     Dysphagia Treatment Treatment focused on: Skilled observation of diet tolerance;Upgraded PO texture trials;Patient/family/caregiver education;Utilization  of compensatory strategies Treatment Methods/Modalities: Skilled observation Patient observed directly with PO's: Yes Type of PO's observed: Regular;Thin liquids Feeding: Able to feed self Liquids provided via: Straw Type of cueing: Verbal Amount of cueing: Minimal   GO    Dale Ditty, MA CCC-SLP [phone]  Dale Key 10/13/2012, 2:05 PM

## 2012-10-13 NOTE — Progress Notes (Addendum)
Physical Therapy Patient Details Name: Dale Key MRN: [id number] DOB: 1955-07-08 Today's Date: 10/13/2012      PT Assessment / Plan / Recommendation       PT Comments   Pt progressing well with ambulation and no longer appropriate for PT 5x/week therapy on acute side of hospital.  Will decr frequency to 3x/week.  Hopeful that pt will transfer to Rehab for continued therapy to reach Modif I level to d/c home as he still has UE deficits limiting function with ADL's as well as still can work on balance dynamically.                                  Frequency Min 3X/week                PT Goals (current goals can now be found in the care plan section)                                   Dale Key 10/13/2012, 8:33 AM [facility] [phone] (pager)

## 2012-10-13 NOTE — Progress Notes (Signed)
Report given to [facility] on [facility]. Patient is in stable condition at this time. All belongs sent with patient.   Sim Boast, RN 10/13/12 1451

## 2012-10-13 NOTE — Progress Notes (Signed)
Patient ID: Dale Key, male   DOB: Aug 29, 1955, 57 y.o.   MRN: [id number] Stable, eating well with no complains. Hungry. Rehab pending.

## 2012-10-13 NOTE — Progress Notes (Signed)
I have insurance approval and plan to admit to inpt rehab today. I contacted Dr. Jeral Fruit and he is aware and in agreement. [phone]

## 2012-10-14 ENCOUNTER — Inpatient Hospital Stay (HOSPITAL_COMMUNITY): Payer: BC Managed Care – PPO | Admitting: Occupational Therapy

## 2012-10-14 ENCOUNTER — Inpatient Hospital Stay (HOSPITAL_COMMUNITY): Payer: BC Managed Care – PPO | Admitting: Speech Pathology

## 2012-10-14 ENCOUNTER — Inpatient Hospital Stay (HOSPITAL_COMMUNITY): Payer: BC Managed Care – PPO | Admitting: Physical Therapy

## 2012-10-14 DIAGNOSIS — G8250 Quadriplegia, unspecified: Secondary | ICD-10-CM

## 2012-10-14 LAB — CBC WITH DIFFERENTIAL/PLATELET
HCT: 37 % — ABNORMAL LOW (ref 39.0–52.0)
Hemoglobin: 11.9 g/dL — ABNORMAL LOW (ref 13.0–17.0)
Lymphocytes Relative: 7 % — ABNORMAL LOW (ref 12–46)
MCV: 93.9 fL (ref 78.0–100.0)
Monocytes Absolute: 0.6 10*3/uL (ref 0.1–1.0)
Monocytes Relative: 4 % (ref 3–12)
Neutro Abs: 14.5 10*3/uL — ABNORMAL HIGH (ref 1.7–7.7)
WBC: 16.4 10*3/uL — ABNORMAL HIGH (ref 4.0–10.5)

## 2012-10-14 LAB — COMPREHENSIVE METABOLIC PANEL
ALT: 104 U/L — ABNORMAL HIGH (ref 0–53)
AST: 30 U/L (ref 0–37)
Albumin: 3.1 g/dL — ABNORMAL LOW (ref 3.5–5.2)
Alkaline Phosphatase: 78 U/L (ref 39–117)
BUN: 31 mg/dL — ABNORMAL HIGH (ref 6–23)
CO2: 29 mEq/L (ref 19–32)
Calcium: 9.1 mg/dL (ref 8.4–10.5)
Chloride: 91 mEq/L — ABNORMAL LOW (ref 96–112)
Creatinine, Ser: 0.89 mg/dL (ref 0.50–1.35)
GFR calc Af Amer: >90 mL/min (ref >90)
GFR calc non Af Amer: >90 mL/min (ref >90)
Glucose, Bld: 215 mg/dL — ABNORMAL HIGH (ref 70–99)
Potassium: 4.2 mEq/L (ref 3.5–5.1)
Sodium: 131 mEq/L — ABNORMAL LOW (ref 135–145)
Total Bilirubin: 0.6 mg/dL (ref 0.3–1.2)
Total Protein: 7.3 g/dL (ref 6.0–8.3)

## 2012-10-14 LAB — GLUCOSE, CAPILLARY
Glucose-Capillary: 193 mg/dL — ABNORMAL HIGH (ref 70–99)
Glucose-Capillary: 191 mg/dL — ABNORMAL HIGH (ref 70–99)
Glucose-Capillary: 231 mg/dL — ABNORMAL HIGH (ref 70–99)
Glucose-Capillary: 222 mg/dL — ABNORMAL HIGH (ref 70–99)

## 2012-10-14 MED ORDER — GUAIFENESIN ER 600 MG PO TB12
600.0000 mg | ORAL_TABLET | Freq: Two times a day (BID) | ORAL | Status: DC
  Administered 2012-10-14 – 2012-10-20 (×13): 600 mg via ORAL
  Filled 2012-10-14 (×15): qty 1

## 2012-10-14 NOTE — Evaluation (Signed)
Physical Therapy Assessment and Plan  Patient Details  Name: Dale Key MRN: [id number] Date of Birth: 06-30-1955  PT Diagnosis: Difficulty walking, Impaired sensation and Muscle weakness Rehab Potential: Good ELOS: 1 week   Today's Date: 10/14/2012 Time: 1027-2536 Time Calculation (min): 58 min  Problem List:  Patient Active Problem List   Diagnosis Date Noted  . 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 Hypertension   . Diabetes mellitus without complication    Past Surgical History:  Past Surgical History  Procedure Laterality Date  . Eye surgery      Assessment & Plan Clinical Impression: Patient is a 57 y.o. year old male with recent admission to the hospital on 10/07/2012 after recent fall 4 days ago and struck his head. There was no loss of consciousness. Patient with complaints of tingling and numbness in his hands and legs as well as difficulty with ambulation. MRI C-spine showed acute cord contusion no epidural hematoma with severe stenosis at the level of C3-4 and 4-5. Underwent anterior cervical discectomy decompression of spinal cord interbody fusion 10/08/2012 per Dr. Jeral Fruit.  Patient transferred to CIR on 10/13/2012 .   Patient currently requires min with mobility secondary to muscle weakness and decreased standing balance.  Prior to hospitalization, patient was independent  with mobility and lived with Alone in a House home.  Home access is 2 steps to enter front etrance and 2 steps to enter back entranceStairs to enter.  Patient will benefit from skilled PT intervention to maximize safe functional mobility, minimize fall risk and decrease caregiver burden for planned discharge home with intermittent assist.  Anticipate patient will not need PT follow up at discharge.  PT - End of Session Activity Tolerance: Tolerates 30+ min activity with multiple rests Endurance Deficit: Yes PT Assessment Rehab  Potential: Good PT Patient demonstrates impairments in the following area(s): Balance;Endurance;Motor;Pain;Sensory PT Transfers Functional Problem(s): Bed Mobility;Bed to Chair;Car;Furniture PT Locomotion Functional Problem(s): Stairs;Ambulation PT Plan PT Intensity: Minimum of 1-2 x/day ,45 to 90 minutes PT Frequency: 5 out of 7 days PT Duration Estimated Length of Stay: 1 week PT Treatment/Interventions: Ambulation/gait training;Discharge planning;Functional mobility training;Therapeutic Activities;Therapeutic Exercise;Wheelchair propulsion/positioning;Neuromuscular re-education;Balance/vestibular training;DME/adaptive equipment instruction;Community reintegration;Patient/family education;Stair training;UE/LE Coordination activities;Splinting/orthotics;UE/LE Strength taining/ROM;Pain management PT Transfers Anticipated Outcome(s): mod I PT Locomotion Anticipated Outcome(s): mod I PT Recommendation Follow Up Recommendations: None Patient destination: Home  Skilled Therapeutic Intervention Berg balance test 43/56.  Car transfers with supervision.  Dynamic gait training with obstacles and speed and direction changes with min guard, cues for obstacle negotiation due to decreased neck AROM.  Bed and household mobility in ADL apartment with min A for supine to sit, supervision for household gait.  PT Evaluation Precautions/Restrictions Precautions Precautions: Fall;Cervical Restrictions Weight Bearing Restrictions: No Pain Pain Assessment Pain Assessment: No/denies pain Faces Pain Scale: Hurts little more Pain Type: Surgical pain Pain Location: Neck Pain Intervention(s): RN made aware Home Living/Prior Functioning Home Living Available Help at Discharge: Available 24 hours/day Type of Home: House Home Access: Stairs to enter [employer] of Steps: 2 steps to enter front etrance and 2 steps to enter back entrance Entrance Stairs-Rails: None Home Layout: Two level;Able to  live on main level with bedroom/bathroom  Lives With: Alone Prior Function Level of Independence: Independent with basic ADLs;Independent with gait;Independent with transfers  Able to Take Stairs?: Yes Driving: Yes  Cognition Overall Cognitive Status: Within Functional Limits for tasks assessed Arousal/Alertness: Awake/alert Orientation Level: Oriented  X4 Memory: Appears intact Awareness: Appears intact Problem Solving: Appears intact Safety/Judgment: Appears intact Sensation Sensation Light Touch: Impaired by gross assessment Proprioception: Impaired by gross assessment Additional Comments: patient with reporst of numbness & tingling and general weakness throughout bilateral hands; patient with reports of painful sensation during light touch -> arms Coordination Gross Motor Movements are Fluid and Coordinated: Yes Motor  Motor Motor: Within Functional Limits Motor - Skilled Clinical Observations: weakness B UEs  Mobility Bed Mobility Supine to Sit: 4: Min assist Supine to Sit Details (indicate cue type and reason): lifting assist at trunk Transfers Sit to Stand: 4: Min guard Stand Pivot Transfers: 4: Min guard Locomotion  Ambulation Ambulation: Yes Ambulation/Gait Assistance: 4: Min guard Ambulation Distance (Feet): 300 Feet Assistive device: None Ambulation/Gait Assistance Details: pt with decreased cadence and increased sway, no LOB in controlled or busy environment Stairs / Additional Locomotion Stairs: Yes Stairs Assistance: 4: Min assist Stairs Assistance Details (indicate cue type and reason): assist for balance during single leg stance and assist for eccentric control Stair Management Technique: Two rails Number of Stairs: 5  Trunk/Postural Assessment  Cervical Assessment Cervical Assessment:  (not tested due to surgery) Thoracic Assessment Thoracic Assessment: Within Functional Limits Lumbar Assessment Lumbar Assessment: Within Functional Limits Postural  Control Postural Control: Within Functional Limits  Balance Standardized Balance Assessment Standardized Balance Assessment: Berg Balance Test Berg Balance Test Sit to Stand: Able to stand  independently using hands Standing Unsupported: Able to stand safely 2 minutes Sitting with Back Unsupported but Feet Supported on Floor or Stool: Able to sit safely and securely 2 minutes Stand to Sit: Sits safely with minimal use of hands Transfers: Able to transfer safely, minor use of hands Standing Unsupported with Eyes Closed: Able to stand 10 seconds with supervision Standing Ubsupported with Feet Together: Able to place feet together independently and stand 1 minute safely From Standing, Reach Forward with Outstretched Arm: Can reach forward >12 cm safely (5") From Standing Position, Pick up Object from Floor: Able to pick up shoe, needs supervision From Standing Position, Turn to Look Behind Over each Shoulder: Looks behind from both sides and weight shifts well Turn 360 Degrees: Able to turn 360 degrees safely but slowly Standing Unsupported, Alternately Place Feet on Step/Stool: Able to complete >2 steps/needs minimal assist Standing Unsupported, One Foot in Front: Able to take small step independently and hold 30 seconds Standing on One Leg: Able to lift leg independently and hold equal to or more than 3 seconds Total Score: 43 Extremity Assessment      RLE Assessment RLE Assessment: Within Functional Limits LLE Assessment LLE Assessment: Within Functional Limits  FIM:  FIM - Bed/Chair Transfer Bed/Chair Transfer: 5: Bed > Chair or W/C: Supervision (verbal cues/safety issues);5: Chair or W/C > Bed: Supervision (verbal cues/safety issues) FIM - Locomotion: Wheelchair Locomotion: Wheelchair: 0: Activity did not occur FIM - Locomotion: Ambulation Ambulation/Gait Assistance: 4: Min guard Locomotion: Ambulation: 4: Travels 150 ft or more with minimal assistance (Pt.>75%) FIM -  Locomotion: Stairs Locomotion: Stairs: 2: Up and Down 4 - 11 stairs with minimal assistance (Pt.>75%)   Refer to Care Plan for Long Term Goals  Recommendations for other services: None  Discharge Criteria: Patient will be discharged from PT if patient refuses treatment 3 consecutive times without medical reason, if treatment goals not met, if there is a change in medical status, if patient makes no progress towards goals or if patient is discharged from hospital.  The above assessment, treatment plan,  treatment alternatives and goals were discussed and mutually agreed upon: by patient  Jina Olenick 10/14/2012, 2:11 PM

## 2012-10-14 NOTE — Progress Notes (Signed)
Patient information reviewed and entered into eRehab system by Conall Vangorder, RN, CRRN, PPS Coordinator.  Information including medical coding and functional independence measure will be reviewed and updated through discharge.    

## 2012-10-14 NOTE — Evaluation (Signed)
Speech Language Pathology Bedside Swallow Evaluation   Patient Details  Name: Dale Key MRN: [id number] Date of Birth: 03/16/1955  SLP Diagnosis: Dysphagia  Rehab Potential: Excellent ELOS: 1 week   Today's Date: 10/14/2012 Time: 0730-0755 Time Calculation (min): 25 min  Skilled Theraputic Intervention: Administered BSE, please see below for details.   Problem List:  Patient Active Problem List   Diagnosis Date Noted  . 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 Hypertension   . Diabetes mellitus without complication    Past Surgical History:  Past Surgical History  Procedure Laterality Date  . Eye surgery      Assessment / Plan / Recommendation Clinical Impression  Pt is a 57 y.o. right-handed male with history of hypertension as well as diabetes mellitus and peripheral neuropathy. 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 There was no loss of consciousness. Patient with complaints of tingling and numbness in his hands and legs as well as difficulty with ambulation. MRI C-spine showed acute cord contusion no epidural hematoma with severe stenosis at the level of C3-4 and 4-5. Underwent anterior cervical discectomy decompression of spinal cord interbody fusion 10/08/2012 per Dr. Jeral Fruit. Postoperative pain management. Decadron protocol as directed. Patient presently on a clear liquid diet with followup speech therapy for questionable chronic dysphagia. Physical therapy evaluation completed 10/08/2012 with recommendations for physical medicine rehabilitation consult to consider inpatient rehabilitation services. Patient was felt to be a good candidate for inpatient rehabilitation services and was admitted for comprehensive rehabilitation program. Patient transferred to CIR on 10/13/2012 and was administered a BSE. Pt demonstrated efficient  mastication of regular textures but demonstrated intermittent throat clearing without a wet vocal quality. Pt reported difficulty masticating meat due to the dryness. Pt consumed thin liquids via straw without overt s/s of aspiration. Pt required Min A verbal cues for utilization of small bites but utilized alternating between liquids and solids with Mod I. Recommend pt downgraded to Dys. 3 textures for utilization of small bites and increase moisture in food to increase pt's overall swallowing safety. Pt would benefit from skilled SLP intervention to maximize swallowing safety with least restrictive diet.     SLP Assessment  Patient will need skilled Speech Lanaguage Pathology Services during CIR admission    Recommendations  Diet Recommendations: Thin liquid;Dysphagia 3 (Mechanical Soft) Liquid Administration via: Straw Medication Administration: Whole meds with puree Supervision: Patient able to self feed;Intermittent supervision to cue for compensatory strategies Compensations: Slow rate;Small sips/bites;Follow solids with liquid Postural Changes and/or Swallow Maneuvers: Seated upright 90 degrees;Out of bed for meals Oral Care Recommendations: Oral care BID Patient destination: Home Follow up Recommendations:  (TBD) Equipment Recommended: None recommended by SLP    SLP Frequency 5 out of 7 days   SLP Treatment/Interventions Cueing hierarchy;Dysphagia/aspiration precaution training;Functional tasks;Patient/family education;Speech/Language facilitation;Therapeutic Activities;Environmental controls;Internal/external aids    Pain Pain Assessment Pain Assessment: No/denies pain  Short Term Goals: Week 1: SLP Short Term Goal 1 (Week 1): Pt will utilize swallowing compensatory strategies with supervision visual cues to minimize overt s/s of aspiration. SLP Short Term Goal 2 (Week 1): Pt will demonstrate efficient mastication of regular textures without overt s/s of aspiration with Mod I.    See FIM for current functional status Refer to Care Plan for Long Term Goals  Recommendations for other services: None  Discharge Criteria: Patient will be discharged from SLP  if patient refuses treatment 3 consecutive times without medical reason, if treatment goals not met, if there is a change in medical status, if patient makes no progress towards goals or if patient is discharged from hospital.  The above assessment, treatment plan, treatment alternatives and goals were discussed and mutually agreed upon: by patient  Rebekkah Powless 10/14/2012, 1:35 PM

## 2012-10-14 NOTE — Progress Notes (Signed)
[facility]patient [facility] Individual Statement of Services  Patient Name:  Dale Key  Date:  10/14/2012  Welcome to the [facility].  Our goal is to provide you with an individualized program based on your diagnosis and situation, designed to meet your specific needs.  With this comprehensive rehabilitation program, you will be expected to participate in at least 3 hours of rehabilitation therapies Monday-Friday, with modified therapy programming on the weekends.  Your rehabilitation program will include the following services:  Physical Therapy (PT), Occupational Therapy (OT), Speech Therapy (ST), 24 hour per day rehabilitation nursing, Case Management (Social Worker), Rehabilitation Medicine, Nutrition Services and Pharmacy Services  Weekly team conferences will be held on Tuesdays to discuss your progress.  Your Social Worker will talk with you frequently to get your input and to update you on team discussions.  Team conferences with you and your family in attendance may also be held.  Expected length of stay: 1 week Overall anticipated outcome: modified independent  Depending on your progress and recovery, your program may change. Your Social Worker will coordinate services and will keep you informed of any changes. Your Social Worker's name and contact numbers are listed  below.  The following services may also be recommended but are not provided by the [facility]:   Driving Evaluations  Home Health Rehabiltiation Services  Outpatient Rehabilitation Services  Vocational Rehabilitation   Arrangements will be made to provide these services after discharge if needed.  Arrangements include referral to agencies that provide these services.  Your insurance has been verified to be:  BCBS of [place] Your primary doctor is:  Dr. Sharyn Lull  Pertinent information will be shared with your doctor and your insurance company.  Social Worker:  [place], [place] [phone] or ([phone]   Information discussed with and copy given to patient by: Amada Jupiter, 10/14/2012, 2:38 PM

## 2012-10-14 NOTE — Progress Notes (Signed)
Subjective/Complaints: Having some pain at the base of his neck. Concerned about having to urinate due to lasix while in therapies. A 12 point review of systems has been performed and if not noted above is otherwise negative.   Objective: Vital Signs: Blood pressure 136/76, pulse 54, temperature 98.3 F (36.8 C), temperature source Oral, resp. rate 20, weight 130.182 kg (287 lb), SpO2 100.00%. No results found.  Recent Labs  10/14/12 0555  WBC 16.4*  HGB 11.9*  HCT 37.0*  PLT 248    Recent Labs  10/14/12 0555  NA 131*  K 4.2  CL 91*  GLUCOSE 215*  BUN 31*  CREATININE 0.89  CALCIUM 9.1   CBG (last 3)   Recent Labs  10/13/12 1720 10/13/12 2034 10/14/12 0731  GLUCAP 251* 170* 193*    Wt Readings from Last 3 Encounters:  10/13/12 130.182 kg (287 lb)  10/08/12 138.1 kg (304 lb 7.3 oz)  10/08/12 138.1 kg (304 lb 7.3 oz)    Physical Exam:  Blood pressure 113/64, pulse 61, temperature 98.5 F (36.9 C), temperature source Oral, resp. rate 15, height 5\' 9"  (1.753 m), weight 138.1 kg (304 lb 7.3 oz), SpO2 93.00%.  Constitutional: He is oriented to person, place, and time.  HENT: perrl  Head: Normocephalic.  Eyes: EOM are normal.  Neck: No thyromegaly present.  Cardiovascular: Normal rate and regular rhythm. No murmur or gallops  Pulmonary/Chest: Effort normal and breath sounds normal. No respiratory distress. No wheezes  Abdominal: Soft. Bowel sounds are normal. He exhibits no distension.  Obese  Neurological: He is alert and oriented to person, place, and time. Normal sensation over the clavicle. Decreased sensation over the deltoid with hyperesthesia Decreased FT/pain in hands/fingers. RUE 2-/5 shoulder, 2- to 3/5 distally. LUE is 3- to 3 deltoid, bicep, tricep, hand. RLE is 3- prox at HF, 4-KE, 3 ankle. LLE is her/5 in the hip flexor knee extensor ankle dorsiflexor. DTR's 1+. Seems distracted, fair insight and awareness  Musc: shoulder ROM limited in ABD,Flex,ext,  head forward posture, mild pain with palpation around C7 Skin:  Surgical site clean and dry with dressing intact  Psychiatric: He has a normal mood and affect.  A little difficult to redirect. Decreased insight and awareness    Assessment/Plan: 1. Functional deficits secondary to C3-5 cord contsuion which require 3+ hours per day of interdisciplinary therapy in a comprehensive inpatient rehab setting. Physiatrist is providing close team supervision and 24 hour management of active medical problems listed below. Physiatrist and rehab team continue to assess barriers to discharge/monitor patient progress toward functional and medical goals. FIM:                   Comprehension Comprehension Mode: Auditory Comprehension: 6-Follows complex conversation/direction: With extra time/assistive device  Expression Expression Mode: Verbal Expression: 6-Expresses complex ideas: With extra time/assistive device  Social Interaction Social Interaction: 7-Interacts appropriately with others - No medications needed.  Problem Solving Problem Solving: 5-Solves basic problems: With no assist  Memory Memory: 6-More than reasonable amt of time  Medical Problem List and Plan:  1. Cervical stenosis with myelopathy/central cord injury. Status post C3, 4-5 decompression and fusion  2. DVT Prophylaxis/Anticoagulation: SCDs. Patient is ambulatory.  3. Pain Management: Percocet, Robaxin as needed. Monitor with increased mobility  4. Neuropsych: This patient is capable of making decisions on his own behalf.  5. Diabetes mellitus with peripheral neuropathy. Blood sugar 140 to 160s. Monitor while on Decadron taper. Continue Glucophage 1000 mg twice a  day   -observe for consistent pattern before making long term change 6. Hypertension. Lasix 40 mg daily, Cozaar 100 mg daily, Toprol-XL 50 mg daily. Monitor with increased mobility  7. Question chronic dysphasia. Followup speech therapy. Advance diet as  tolerated. Monitor for any signs of aspiration   LOS (Days) 1 A FACE TO FACE EVALUATION WAS PERFORMED  SWARTZ,[name] 10/14/2012 7:58 AM

## 2012-10-14 NOTE — Progress Notes (Signed)
Social Work  Social Work Assessment and Plan  Patient Details  Name: Dale Key MRN: [id number] Date of Birth: 15-Jun-1955  Today's Date: 10/14/2012  Problem List:  Patient Active Problem List   Diagnosis Date Noted  . 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 Hypertension   . Diabetes mellitus without complication    Past Surgical History:  Past Surgical History  Procedure Laterality Date  . Eye surgery     Social History:  reports that he quit smoking about 9 months ago. He does not have any smokeless tobacco history on file. He reports that  drinks alcohol. His drug history is not on file.  Family / Support Systems Marital Status: Single Patient Roles: Partner;Other (Comment) (employee) Spouse/Significant Other: girlfriend (8 yrs), Karn Cassis @ [phone] Children: none Other Supports: sister, Virginia Crews @ [phone] Anticipated Caregiver: sister and girlfriend to assist Ability/Limitations of Caregiver: none per pt Caregiver Availability: Other (Comment) (pt reports as he needs) Family Dynamics: pt notes sister is supportive, but does work;  girlfriend also supportive and is not working.  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  Admits much  frustration with his throat irritation which is affected the quality of his voice.  Quickly denies any s/s of depression or anxiety, "... just want this to get better...and my hands".  Formal depression screen deferred at this time but will monitol. Recent Psychosocial Issues: None Pyschiatric History: None Substance Abuse History: None  Patient / Family Perceptions, Expectations & Goals Pt/Family understanding of illness & functional limitations: pt with basic understanding of his SCI and surgery performed/ need for CIR.   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 Anticipated changes in roles/activities/participation: Team anticipating mod i goals, therefore, should be able to return home alone.  He will need sister and gf to assist with activities which require driving and maybe some home management duties. Pt/family expectations/goals: "I just want to get home and back to work"  [employer]: None Premorbid Home Care/DME Agencies: None Transportation available at discharge: yes  Discharge Planning Living Arrangements: Alone Support Systems: Software engineer;Other relatives Type of Residence: Private residence Insurance Resources: Media planner (specify) (BCBS of [place]) Financial Resources: Employment Financial Screen Referred: No Living Expenses: Own Money Management: Patient Does the patient have any problems obtaining your medications?: No Home Management: pt Patient/Family Preliminary Plans: Pt plans to return to his own home at independent level with intermittent assist of gf and sister Social Work Anticipated Follow Up Needs: HH/OP Expected length of stay: ELOS 7 days  Clinical Impression Pleasant, soft spoken, flat affect gentleman here after fall with SCI.  Mostly frustrated by voice and hand limitations. Eager to go home and agreeable with tx  target of 7 day LOS.  Good support from sister and gf.  Will follow for d/c planning and support needs.  Dmitri Pettigrew 10/14/2012,  2:36 PM

## 2012-10-14 NOTE — Progress Notes (Signed)
Speech Language Pathology Daily Session Note  Patient Details  Name: Dale Key MRN: [id number] Date of Birth: 1955/02/27  Today's Date: 10/14/2012 Time: 4098-1191 Time Calculation (min): 25 min  Short Term Goals: Week 1: SLP Short Term Goal 1 (Week 1): Pt will utilize swallowing compensatory strategies with supervision visual cues to minimize overt s/s of aspiration. SLP Short Term Goal 2 (Week 1): Pt will demonstrate efficient mastication of regular textures without overt s/s of aspiration with Mod I.  SLP Short Term Goal 3 (Week 1): Pt will recall and utilize vocal hygeine strategies with Mod I.   Skilled Therapeutic Interventions: Treatment focus on dysphagia goals. SLP facilitated session by providing supervision verbal and visual cues for small bites with trials of Dys. 3 textures. Pt demonstrated efficient mastication with decreased throat clearing compared to morning session with breakfast meal. Pt's O2 was removed and pt remained between 95-99% throughout the session. Recommend to continue with current diet. Pt's girlfriend present during session and education on pt's current swallowing function and diet recommendations. She verbalized understanding.    FIM:  Comprehension Comprehension Mode: Auditory Comprehension: 6-Follows complex conversation/direction: With extra time/assistive device Expression Expression Mode: Verbal Expression: 6-Expresses complex ideas: With extra time/assistive device Problem Solving Problem Solving: 6-Solves complex problems: With extra time Memory Memory: 6-More than reasonable amt of time FIM - Eating Eating Activity: 5: Supervision/cues;5: Set-up assist for open containers  Pain Pain Assessment Pain Assessment: No/denies pain Faces Pain Scale: Hurts little more Pain Type: Surgical pain Pain Location: Neck Pain Intervention(s): RN made aware  Therapy/Group: Individual Therapy  Mazelle Huebert 10/14/2012, 3:29 PM

## 2012-10-14 NOTE — Plan of Care (Signed)
Problem: RH Other (Specify) Goal: RH LTG Other (Specify) Pt will recall vocal hygiene strategies with Mod I.

## 2012-10-14 NOTE — Evaluation (Signed)
Occupational Therapy Assessment and Plan & Session Note  Patient Details  Name: Dale Key MRN: [id number] Date of Birth: [birth year]  OT Diagnosis: abnormal posture, acute pain, muscle weakness (generalized) and quadriparesis at level Rehab Potential: Good ELOS: ~7days  Today's Date: 10/14/2012  Problem List:  Patient Active Problem List   Diagnosis Date Noted  . 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 Hypertension   . Diabetes mellitus without complication    Past Surgical History:  Past Surgical History  Procedure Laterality Date  . Eye surgery      Clinical Impression: Dale Key is a 57 y.o. right-handed male with history of hypertension as well as diabetes mellitus and peripheral neuropathy. 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 There was no loss of consciousness. Patient with complaints of tingling and numbness in his hands and legs as well as difficulty with ambulation. MRI C-spine showed acute cord contusion no epidural hematoma with severe stenosis at the level of C3-4 and 4-5. Underwent anterior cervical discectomy decompression of spinal cord interbody fusion 10/08/2012 per Dr. Jeral Fruit. Postoperative pain management. Decadron protocol as directed. Patient presently on a clear liquid diet with followup speech therapy for questionable chronic dysphagia. Physical therapy evaluation completed 10/08/2012 with recommendations for physical medicine rehabilitation consult to consider inpatient rehabilitation services. Patient was felt to be a good candidate for inpatient rehabilitation services and was admitted for comprehensive rehabilitation program. Patient transferred to CIR on 10/13/2012 .    Patient currently requires total with basic self-care skills and IADL secondary to muscle weakness, muscle joint tightness and muscle  paralysis, decreased cardiorespiratoy endurance, decreased coordination and decreased sitting balance, decreased standing balance, decreased postural control and decreased balance strategies.  Prior to hospitalization, patient could complete ADLs and IADLs independently.   Patient will benefit from skilled intervention to increase independence with basic self-care skills prior to discharge home independently.  Anticipate patient will require intermittent supervision and follow up outpatient.  OT - End of Session Activity Tolerance: Tolerates 30+ min activity with multiple rests OT Assessment Rehab Potential: Good Barriers to Discharge:  (none known at this time) OT Patient demonstrates impairments in the following area(s): Balance;Motor;Pain;Safety;Skin Integrity OT Basic ADL's Functional Problem(s): Grooming;Eating;Bathing;Dressing;Toileting OT Advanced ADL's Functional Problem(s): Simple Meal Preparation;Light Housekeeping OT Transfers Functional Problem(s): Toilet;Tub/Shower OT Additional Impairment(s): Fuctional Use of Upper Extremity OT Plan OT Intensity: Minimum of 1-2 x/day, 45 to 90 minutes OT Frequency: 5 out of 7 days OT Duration/Estimated Length of Stay: ~7 days OT Treatment/Interventions: Balance/vestibular training;Community reintegration;Discharge planning;DME/adaptive equipment instruction;Neuromuscular re-education;Functional mobility training;Pain management;Patient/family education;Psychosocial support;Self Care/advanced ADL retraining;Skin care/wound managment;Splinting/orthotics;Therapeutic Activities;UE/LE Strength taining/ROM;UE/LE Coordination activities;Wheelchair propulsion/positioning;Therapeutic Exercise OT Self Feeding Anticipated Outcome(s): independent OT Basic Self-Care Anticipated Outcome(s): mod I OT Toileting Anticipated Outcome(s): mod I OT Bathroom Transfers Anticipated Outcome(s): mod I OT Recommendation Patient destination: Home Follow Up  Recommendations: Outpatient OT Equipment Recommended: 3 in 1 bedside comode;Tub/shower seat  Precautions/Restrictions  Precautions Precautions: Cervical;Fall Restrictions Weight Bearing Restrictions: No  General Chart Reviewed: Yes Family/Caregiver Present: No  Vital Signs Therapy Vitals Temp: 98.3 F (36.8 C) Temp src: Oral Pulse Rate: 54 Resp: 20 BP: 136/76 mmHg Patient Position, if appropriate: Sitting Oxygen Therapy SpO2: 100 %  Pain Pain Assessment Pain Assessment: No/denies pain Pain Score: 0-No pain Faces Pain Scale: No hurt  Home Living/Prior Functioning Home Living Available Help at Discharge:  Available 24 hours/day (girlfriend) Type of Home: House Home Access: Stairs to enter [employer] of Steps: 2 steps to enter front etrance and 2 steps to enter back entrance Entrance Stairs-Rails: None Home Layout: Two level;Able to live on main level with bedroom/bathroom Alternate Level Stairs-Number of Steps: 7-8; patient states he rarely goes to upstairs Alternate Level Stairs-Rails: Right  Lives With: Alone IADL History Homemaking Responsibilities: Yes Meal Prep Responsibility: Primary Laundry Responsibility: Primary Cleaning Responsibility: Primary Bill Paying/Finance Responsibility: Primary Shopping Responsibility: Primary Child Care Responsibility: No Current License: Yes Mode of Transportation: Car Occupation: Full time employment 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, drilling: enjoys listening to music, recording music, singing IADL Comments: Patient states that he is concerned with the decrease function in his bilateral hands and his voice Prior Function Level of Independence: Independent with basic ADLs;Independent with gait;Independent with transfers  Able to Take Stairs?: Yes Driving: Yes Comments: patient with report of back injury may 2014  ADL - See FIM  Vision/Perception  Vision - History Baseline Vision: Wears  glasses only for reading Patient Visual Report: No change from baseline Vision - Assessment Eye Alignment: Within Functional Limits Perception Perception: Within Functional Limits Praxis Praxis: Intact   Cognition Overall Cognitive Status: Within Functional Limits for tasks assessed Arousal/Alertness: Awake/alert Orientation Level: Oriented X4 Memory: Appears intact Awareness: Appears intact Problem Solving: Appears intact Safety/Judgment: Appears intact  Sensation Sensation Light Touch: Impaired by gross assessment Stereognosis: Impaired by gross assessment Hot/Cold: Impaired by gross assessment Proprioception: Impaired by gross assessment Additional Comments: patient with reporst of numbness & tingling and general weakness throughout bilateral hands; patient with reports of painful sensation during light touch -> arms Coordination Gross Motor Movements are Fluid and Coordinated: Yes (decreased gross motor movements) Fine Motor Movements are Fluid and Coordinated: Yes (decreased fine motor movements)  Motor  Motor Motor: Within Functional Limits Motor - Skilled Clinical Observations: weakness B UEs  Mobility  Bed Mobility Supine to Sit: 4: Min assist Supine to Sit Details (indicate cue type and reason): lifting assist at trunk Transfers Sit to Stand: 4: Min guard   Trunk/Postural Assessment  Cervical Assessment Cervical Assessment:  (not tested due to surgery) Thoracic Assessment Thoracic Assessment: Within Functional Limits Lumbar Assessment Lumbar Assessment: Within Functional Limits Postural Control Postural Control: Within Functional Limits   Balance Standardized Balance Assessment Standardized Balance Assessment: Berg Balance Test Berg Balance Test Sit to Stand: Able to stand  independently using hands Standing Unsupported: Able to stand safely 2 minutes Sitting with Back Unsupported but Feet Supported on Floor or Stool: Able to sit safely and securely 2  minutes Stand to Sit: Sits safely with minimal use of hands Transfers: Able to transfer safely, minor use of hands Standing Unsupported with Eyes Closed: Able to stand 10 seconds with supervision Standing Ubsupported with Feet Together: Able to place feet together independently and stand 1 minute safely From Standing, Reach Forward with Outstretched Arm: Can reach forward >12 cm safely (5") From Standing Position, Pick up Object from Floor: Able to pick up shoe, needs supervision From Standing Position, Turn to Look Behind Over each Shoulder: Looks behind from both sides and weight shifts well Turn 360 Degrees: Able to turn 360 degrees safely but slowly Standing Unsupported, Alternately Place Feet on Step/Stool: Able to complete >2 steps/needs minimal assist Standing Unsupported, One Foot in Front: Able to take small step independently and hold 30 seconds Standing on One Leg: Able to lift leg independently and hold equal  to or more than 3 seconds Total Score: 43  Extremity/Trunk Assessment RUE Assessment RUE Assessment: Exceptions to [facility] RUE AROM (degrees) RUE Overall AROM Comments: Shoulder AROM is poor; patient able to flex shoulder ~40* while hicking shoulder. Elbow, wrist, & finger AROM is WFL RUE PROM (degrees) RUE Overall PROM Comments: PROM is WFL, some tightness noted RUE Strength RUE Overall Strength Comments: decreased should strength. elbow flexion=3/5, elbow extension=5/5, wrist flexion/extension=5/5 LUE Assessment LUE Assessment: Exceptions to WFL LUE AROM (degrees) LUE Overall AROM Comments: Shoulder AROM is poor, patient is able to flex shoulder ~90*, Elbow, wrist, & finger AROM is WFL LUE PROM (degrees) LUE Overall PROM Comments: PROM is WFL, some tightness noted; patient with decreased elbow flexion, patient states this was pre-morbid LUE Strength LUE Overall Strength Comments: decreased shoulder strength. elbow flexion=4/5, elbow extension =4/5, wrist  flexion/extension=5/5  FIM:  FIM - Grooming Grooming: 0: Activity did not occur FIM - Bathing Bathing Steps Patient Completed: Chest;Front perineal area Bathing: 1: Total-Patient completes 0-2 of 10 parts or less than 25% FIM - Upper Body Dressing/Undressing Upper body dressing/undressing: 1: Total-Patient completed less than 25% of tasks FIM - Lower Body Dressing/Undressing Lower body dressing/undressing: 1: Total-Patient completed less than 25% of tasks FIM - Archivist Transfers: 4-To toilet/BSC: Min A (steadying Pt. > 75%);4-From toilet/BSC: Min A (steadying Pt. > 75%)   Refer to Care Plan for Long Term Goals  Recommendations for other services: None  Discharge Criteria: Patient will be discharged from OT if patient refuses treatment 3 consecutive times without medical reason, if treatment goals not met, if there is a change in medical status, if patient makes no progress towards goals or if patient is discharged from hospital.  The above assessment, treatment plan, treatment alternatives and goals were discussed and mutually agreed upon: by patient  -------------------------------------------------------------------------------------------------------------------  SESSION NOTE 0900-1000 - 60 Minutes Individual Therapy No complaints of pain Initial 1:1 occupational therapy evaluation completed. Focused skilled intervention on UB/LB bathing & dressing, sit<>stands, dynamic standing balance/tolerance/endurance, functional use of bilateral UEs and hands, overall activity tolerance/endurance, functional ambulation/mobility, and toilet transfer on/off elevated toilet seat. Therapist educated patient on some BUE strengthening exercises for biceps, triceps, and hand/fingers/wrist. Left patient seated in recliner with call bell & phone within reach. Patient very motivated to be as independent as possible.   Collins Kerby 10/14/2012, 2:28 PM

## 2012-10-14 NOTE — Evaluation (Signed)
Speech Language Pathology Cognitive-linguistic Evaluation  Patient Details  Name: Dale Key MRN: [id number] Date of Birth: 07-14-1955  SLP Diagnosis: Voice disorder  Rehab Potential: Excellent ELOS: 1 week   Today's Date: 10/14/2012 Time: 4034-7425 Time Calculation (min): 30 min  Skilled Therapeutic Intervention: Administered cognitive-linguistic evaluation. Please see below for details.   Problem List:  Patient Active Problem List   Diagnosis Date Noted  . 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 Hypertension   . Diabetes mellitus without complication    Past Surgical History:  Past Surgical History  Procedure Laterality Date  . Eye surgery      Assessment / Plan / Recommendation Clinical Impression  Pt is a 57 y.o. right-handed male with history of hypertension as well as diabetes mellitus and peripheral neuropathy. 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 There was no loss of consciousness. Patient with complaints of tingling and numbness in his hands and legs as well as difficulty with ambulation. MRI C-spine showed acute cord contusion no epidural hematoma with severe stenosis at the level of C3-4 and 4-5. Underwent anterior cervical discectomy decompression of spinal cord interbody fusion 10/08/2012 per Dr. Jeral Fruit. Postoperative pain management. Decadron protocol as directed. Patient presently on a clear liquid diet with followup speech therapy for questionable chronic dysphagia. Physical therapy evaluation completed 10/08/2012 with recommendations for physical medicine rehabilitation consult to consider inpatient rehabilitation services. Patient was felt to be a good candidate for inpatient rehabilitation services and was admitted for comprehensive rehabilitation  program. Patient transferred to CIR on 10/13/2012 and was administered a cognitive-linguistic evaluation. Pt's overall cognitive-linguistic function appears [facility] for all tasks assessed. Throughout the session, the pt demonstrated a wet vocal quality with constant throat clearing with the absence of food or drink. Pt reports he has chronic mucous at times and was able to expectorate via mouth with use of suction. Pt educated on proper vocal hygiene to increase pt's overall vocal function. Pt would benefit from skilled SLP intervention to maximize vocal function and health.     SLP Assessment  Patient will need skilled Speech Lanaguage Pathology Services during CIR admission    Recommendations  Diet Recommendations: Thin liquid;Dysphagia 3 (Mechanical Soft) Liquid Administration via: Straw Medication Administration: Whole meds with puree Supervision: Patient able to self feed;Intermittent supervision to cue for compensatory strategies Compensations: Slow rate;Small sips/bites;Follow solids with liquid Postural Changes and/or Swallow Maneuvers: Seated upright 90 degrees;Out of bed for meals Oral Care Recommendations: Oral care BID Patient destination: Home Follow up Recommendations:  (TBD) Equipment Recommended: None recommended by SLP    SLP Frequency 5 out of 7 days   SLP Treatment/Interventions Cueing hierarchy;Dysphagia/aspiration precaution training;Functional tasks;Patient/family education;Speech/Language facilitation;Therapeutic Activities;Environmental controls;Internal/external aids    Pain Pain Assessment Pain Assessment: No/denies pain  Short Term Goals: Week 1: SLP Short Term Goal 1 (Week 1): Pt will utilize swallowing compensatory strategies with supervision visual cues to minimize overt s/s of aspiration. SLP Short Term Goal 2 (Week 1): Pt will demonstrate efficient mastication of regular textures without overt s/s of aspiration with Mod I.  SLP Short Term Goal 3 (Week 1): Pt will  recall and utilize vocal hygeine strategies with Mod I.   See FIM for current functional status Refer to Care Plan for Long Term Goals  Recommendations for other services: None  Discharge Criteria: Patient will be discharged from SLP if  patient refuses treatment 3 consecutive times without medical reason, if treatment goals not met, if there is a change in medical status, if patient makes no progress towards goals or if patient is discharged from hospital.  The above assessment, treatment plan, treatment alternatives and goals were discussed and mutually agreed upon: by patient  Jaimee Corum 10/14/2012, 1:42 PM

## 2012-10-15 ENCOUNTER — Inpatient Hospital Stay (HOSPITAL_COMMUNITY): Payer: BC Managed Care – PPO | Admitting: Speech Pathology

## 2012-10-15 LAB — GLUCOSE, CAPILLARY
Glucose-Capillary: 217 mg/dL — ABNORMAL HIGH (ref 70–99)
Glucose-Capillary: 242 mg/dL — ABNORMAL HIGH (ref 70–99)
Glucose-Capillary: 287 mg/dL — ABNORMAL HIGH (ref 70–99)

## 2012-10-15 MED ORDER — DEXAMETHASONE 4 MG PO TABS
4.0000 mg | ORAL_TABLET | Freq: Three times a day (TID) | ORAL | Status: DC
  Administered 2012-10-15 – 2012-10-18 (×10): 4 mg via ORAL
  Filled 2012-10-15 (×13): qty 1

## 2012-10-15 NOTE — IPOC Note (Signed)
Overall Plan of Care [facility]) Patient Details Name: Mekhi Sonn MRN: [id number] DOB: 1955-11-04  Admitting Diagnosis: CENTRAL CORD  Hospital Problems: Principal Problem:   Central cord syndrome     Functional Problem List: Nursing Bowel;Medication Management;Pain;Safety;Sensory;Skin Integrity  PT Balance;Endurance;Motor;Pain;Sensory  OT Balance;Motor;Pain;Safety;Skin Integrity  SLP    TR         Basic ADL's: OT Grooming;Eating;Bathing;Dressing;Toileting     Advanced  ADL's: OT Simple Meal Preparation;Light Housekeeping     Transfers: PT Bed Mobility;Bed to Chair;Car;Furniture  OT Toilet;Tub/Shower     Locomotion: PT Stairs;Ambulation     Additional Impairments: OT Fuctional Use of Upper Extremity  SLP Communication expression    TR      Anticipated Outcomes Item Anticipated Outcome  Self Feeding independent  Swallowing  Mod I   Basic self-care  mod I  Toileting  mod I   Bathroom Transfers mod I  Bowel/Bladder  Continent of bowel with min assist and remain continent of bladder  Transfers  mod I  Locomotion  mod I  Communication  Mod I   Cognition  WFL at this time, no current goals   Pain  3 out of 0-10  Safety/Judgment  MOD I   Therapy Plan: PT Intensity: Minimum of 1-2 x/day ,45 to 90 minutes PT Frequency: 5 out of 7 days PT Duration Estimated Length of Stay: 1 week OT Intensity: Minimum of 1-2 x/day, 45 to 90 minutes OT Frequency: 5 out of 7 days OT Duration/Estimated Length of Stay: ~7 days SLP Intensity: Minumum of 1-2 x/day, 30 to 90 minutes SLP Frequency: 5 out of 7 days SLP Duration/Estimated Length of Stay: 1 week       Team Interventions: Nursing Interventions Bowel Management;Pain Management;Skin Care/Wound Management;Medication Management;Patient/Family Education  PT interventions Ambulation/gait training;Discharge planning;Functional mobility training;Therapeutic Activities;Therapeutic Exercise;Wheelchair  propulsion/positioning;Neuromuscular re-education;Balance/vestibular training;DME/adaptive equipment instruction;Community reintegration;Patient/family education;Stair training;UE/LE Coordination activities;Splinting/orthotics;UE/LE Strength taining/ROM;Pain management  OT Interventions Balance/vestibular training;Community reintegration;Discharge planning;DME/adaptive equipment instruction;Neuromuscular re-education;Functional mobility training;Pain management;Patient/family education;Psychosocial support;Self Care/advanced ADL retraining;Skin care/wound managment;Splinting/orthotics;Therapeutic Activities;UE/LE Strength taining/ROM;UE/LE Coordination activities;Wheelchair propulsion/positioning;Therapeutic Exercise  SLP Interventions Cueing hierarchy;Dysphagia/aspiration precaution training;Functional tasks;Patient/family education;Speech/Language facilitation;Therapeutic Activities;Environmental controls;Internal/external aids  TR Interventions    SW/CM Interventions Discharge Planning;Psychosocial Support;Patient/Family Education    Team Discharge Planning: Destination: PT-Home ,OT- Home , SLP-Home Projected Follow-up: PT-None, OT-  Outpatient OT, SLP- (TBD) Projected Equipment Needs: PT- , OT- 3 in 1 bedside comode;Tub/shower seat, SLP-None recommended by SLP Patient/family involved in discharge planning: PT- Patient,  OT-Patient, SLP-Patient  MD ELOS: one week Medical Rehab Prognosis:  Excellent Assessment: The patient has been admitted for CIR therapies. The team will be addressing, functional mobility, strength, stamina, balance, safety, adaptive techniques/equipment, self-care, bowel and bladder mgt, patient and caregiver education, NMR, pain mgt. Goals have been set at Cedric Fishman, MD, [facility]      See Team Conference Notes for weekly updates to the plan of care

## 2012-10-15 NOTE — Progress Notes (Signed)
Speech Language Pathology Daily Session Note  Patient Details  Name: Dale Key MRN: [id number] Date of Birth: Dec 21, 1955  Today's Date: 10/15/2012 Time: 7829-5621 Time Calculation (min): 30 min  Short Term Goals: Week 1: SLP Short Term Goal 1 (Week 1): Pt will utilize swallowing compensatory strategies with supervision visual cues to minimize overt s/s of aspiration. SLP Short Term Goal 2 (Week 1): Pt will demonstrate efficient mastication of regular textures without overt s/s of aspiration with Mod I.  SLP Short Term Goal 3 (Week 1): Pt will recall and utilize vocal hygeine strategies with Mod I.   Skilled Therapeutic Interventions: Skilled ST services provided with focus on speech/language and dysphagia goals. Pt sitting upright in chair, at bedside.  Slp trialed Dys 3 texture with thin liquid. Pt noted with appropriate bolus manipulation, evidenced by no oral stasis, appropriate OTT. Pt noted with no overt s/s of aspiration with Dys 3/thin. Pt reported that mucus was better controlled. SLP reviewed vocal hygiene handout. Pt able to explain various recommendations on handout with min-mod assist. Pt recalled 1 recommendation independently (stay hydrated).    FIM:  Comprehension Comprehension Mode: Auditory Comprehension: 5-Understands complex 90% of the time/Cues < 10% of the time Expression Expression Mode: Verbal Expression: 6-Expresses complex ideas: With extra time/assistive device Social Interaction Social Interaction: 6-Interacts appropriately with others with medication or extra time (anti-anxiety, antidepressant). Problem Solving Problem Solving: 5-Solves complex 90% of the time/cues < 10% of the time Memory Memory: 5-Recognizes or recalls 90% of the time/requires cueing < 10% of the time FIM - Eating Eating Activity: 5: Supervision/cues  Pain Pain Assessment Pain Assessment: No/denies pain  Therapy/Group: Individual Therapy  Reine Bristow, Kara Pacer 10/15/2012, 2:13  PM

## 2012-10-15 NOTE — Progress Notes (Signed)
Subjective/Complaints: Eating breakfast with adaptive utensils. Denies any problems in therapy yesterday. A 12 point review of systems has been performed and if not noted above is otherwise negative.   Objective: Vital Signs: Blood pressure 111/69, pulse 70, temperature 97.4 F (36.3 C), temperature source Oral, resp. rate 20, weight 130.182 kg (287 lb), SpO2 94.00%. No results found.  Recent Labs  10/14/12 0555  WBC 16.4*  HGB 11.9*  HCT 37.0*  PLT 248    Recent Labs  10/14/12 0555  NA 131*  K 4.2  CL 91*  GLUCOSE 215*  BUN 31*  CREATININE 0.89  CALCIUM 9.1   CBG (last 3)   Recent Labs  10/14/12 1915 10/14/12 2125 10/15/12 0729  GLUCAP 231* 222* 217*    Wt Readings from Last 3 Encounters:  10/13/12 130.182 kg (287 lb)  10/08/12 138.1 kg (304 lb 7.3 oz)  10/08/12 138.1 kg (304 lb 7.3 oz)    Physical Exam:  Blood pressure 113/64, pulse 61, temperature 98.5 F (36.9 C), temperature source Oral, resp. rate 15, height 5\' 9"  (1.753 m), weight 138.1 kg (304 lb 7.3 oz), SpO2 93.00%.  Constitutional: He is oriented to person, place, and time.  HENT: perrl  Head: Normocephalic.  Eyes: EOM are normal.  Neck: No thyromegaly present.  Cardiovascular: Normal rate and regular rhythm. No murmur or gallops  Pulmonary/Chest: Effort normal and breath sounds normal. No respiratory distress. No wheezes  Abdominal: Soft. Bowel sounds are normal. He exhibits no distension.  Obese  Neurological: He is alert and oriented to person, place, and time. Normal sensation over the clavicle. Decreased sensation over the deltoid, some loss of FT over the distal UE's also   RUE 2-/5 shoulder, 2- to 3/5 distally. LUE is 3- to 3 deltoid, bicep, tricep, hand. RLE is 3- prox at HF, 4-KE, 3 ankle. LLE is her/5 in the hip flexor knee extensor ankle dorsiflexor. DTR's 1+. Seems distracted, fair insight and awareness  Musc: shoulder ROM limited in ABD,Flex,ext, head forward posture, mild pain with  palpation around C7 Skin:  Surgical site clean and dry with dressing intact  Psychiatric: He has a normal mood and affect.      Assessment/Plan: 1. Functional deficits secondary to C3-5 cord contsuion which require 3+ hours per day of interdisciplinary therapy in a comprehensive inpatient rehab setting. Physiatrist is providing close team supervision and 24 hour management of active medical problems listed below. Physiatrist and rehab team continue to assess barriers to discharge/monitor patient progress toward functional and medical goals. FIM: FIM - Bathing Bathing Steps Patient Completed: Chest;Front perineal area Bathing: 1: Total-Patient completes 0-2 of 10 parts or less than 25%  FIM - Upper Body Dressing/Undressing Upper body dressing/undressing: 1: Total-Patient completed less than 25% of tasks FIM - Lower Body Dressing/Undressing Lower body dressing/undressing: 1: Total-Patient completed less than 25% of tasks     FIM - Archivist Transfers: 4-To toilet/BSC: Min A (steadying Pt. > 75%);4-From toilet/BSC: Min A (steadying Pt. > 75%)  FIM - Bed/Chair Transfer Bed/Chair Transfer: 4: Bed > Chair or W/C: Min A (steadying Pt. > 75%);4: Chair or W/C > Bed: Min A (steadying Pt. > 75%)  FIM - Locomotion: Wheelchair Locomotion: Wheelchair: 0: Activity did not occur FIM - Locomotion: Ambulation Ambulation/Gait Assistance: 4: Min guard Locomotion: Ambulation: 4: Travels 150 ft or more with minimal assistance (Pt.>75%)  Comprehension Comprehension Mode: Auditory Comprehension: 5-Understands complex 90% of the time/Cues < 10% of the time  Expression Expression Mode: Verbal Expression:  7-Expresses complex ideas: With no assist  Social Interaction Social Interaction: 7-Interacts appropriately with others - No medications needed.  Problem Solving Problem Solving: 6-Solves complex problems: With extra time  Memory Memory: 6-More than reasonable amt of  time  Medical Problem List and Plan:  1. Cervical stenosis with myelopathy/central cord injury. Status post C3, 4-5 decompression and fusion  2. DVT Prophylaxis/Anticoagulation: SCDs. Patient is ambulatory.  3. Pain Management: Percocet, Robaxin as needed. Monitor with increased mobility  4. Neuropsych: This patient is capable of making decisions on his own behalf.  5. Diabetes mellitus with peripheral neuropathy. Blood sugar 140 to 160s. Monitor while on Decadron taper. Continue Glucophage 1000 mg twice a day   -decrease decadron 6. Hypertension. Lasix 40 mg daily, Cozaar 100 mg daily, Toprol-XL 50 mg daily. Monitor with increased mobility  7. Question chronic dysphasia. Followup speech therapy. Advance diet as tolerated. Monitor for any signs of aspiration   LOS (Days) 2 A FACE TO FACE EVALUATION WAS PERFORMED  SWARTZ,[name] 10/15/2012 8:23 AM

## 2012-10-16 ENCOUNTER — Inpatient Hospital Stay (HOSPITAL_COMMUNITY): Payer: BC Managed Care – PPO | Admitting: *Deleted

## 2012-10-16 ENCOUNTER — Inpatient Hospital Stay (HOSPITAL_COMMUNITY): Payer: BC Managed Care – PPO | Admitting: Occupational Therapy

## 2012-10-16 LAB — GLUCOSE, CAPILLARY
Glucose-Capillary: 219 mg/dL — ABNORMAL HIGH (ref 70–99)
Glucose-Capillary: 200 mg/dL — ABNORMAL HIGH (ref 70–99)
Glucose-Capillary: 285 mg/dL — ABNORMAL HIGH (ref 70–99)

## 2012-10-16 NOTE — Progress Notes (Signed)
Subjective/Complaints: Complains of neck pain. Reminded him that we discussed it the other day. I'm not sure he recalled. Otherwise doing fairly well. A 12 point review of systems has been performed and if not noted above is otherwise negative.   Objective: Vital Signs: Blood pressure 112/66, pulse 60, temperature 97.8 F (36.6 C), temperature source Oral, resp. rate 20, weight 130.182 kg (287 lb), SpO2 100.00%. No results found.  Recent Labs  10/14/12 0555  WBC 16.4*  HGB 11.9*  HCT 37.0*  PLT 248    Recent Labs  10/14/12 0555  NA 131*  K 4.2  CL 91*  GLUCOSE 215*  BUN 31*  CREATININE 0.89  CALCIUM 9.1   CBG (last 3)   Recent Labs  10/15/12 1135 10/15/12 1650 10/15/12 2040  GLUCAP 260* 242* 287*    Wt Readings from Last 3 Encounters:  10/13/12 130.182 kg (287 lb)  10/08/12 138.1 kg (304 lb 7.3 oz)  10/08/12 138.1 kg (304 lb 7.3 oz)    Physical Exam:  Blood pressure 113/64, pulse 61, temperature 98.5 F (36.9 C), temperature source Oral, resp. rate 15, height 5\' 9"  (1.753 m), weight 138.1 kg (304 lb 7.3 oz), SpO2 93.00%.  Constitutional: He is oriented to person, place, and time.  HENT: perrl  Head: Normocephalic.  Eyes: EOM are normal.  Neck: No thyromegaly present.  Cardiovascular: Normal rate and regular rhythm. No murmur or gallops  Pulmonary/Chest: Effort normal and breath sounds normal. No respiratory distress. No wheezes  Abdominal: Soft. Bowel sounds are normal. He exhibits no distension.  Obese  Neurological: He is alert and oriented to person, place, and time. Normal sensation over the clavicle.   some loss of FT over the distal UE's. Decreased FMC of hands.    RUE 2-/5 shoulder, 2- to 3/5 distally. LUE is 3- to 3 deltoid, bicep, tricep, hand. RLE is 3- prox at HF, 4-KE, 3 ankle. LLE is her/5 in the hip flexor knee extensor ankle dorsiflexor. DTR's 1+. Seems distracted, fair insight and awareness  Musc: shoulder ROM limited in ABD,Flex,ext, head  forward posture, mild pain with palpation around C7 Skin:  Surgical site clean and dry  Psychiatric: He has a normal mood and affect.      Assessment/Plan: 1. Functional deficits secondary to C3-5 cord contsuion which require 3+ hours per day of interdisciplinary therapy in a comprehensive inpatient rehab setting. Physiatrist is providing close team supervision and 24 hour management of active medical problems listed below. Physiatrist and rehab team continue to assess barriers to discharge/monitor patient progress toward functional and medical goals. FIM: FIM - Bathing Bathing Steps Patient Completed: Chest;Front perineal area Bathing: 1: Total-Patient completes 0-2 of 10 parts or less than 25%  FIM - Upper Body Dressing/Undressing Upper body dressing/undressing: 1: Total-Patient completed less than 25% of tasks FIM - Lower Body Dressing/Undressing Lower body dressing/undressing: 1: Total-Patient completed less than 25% of tasks     FIM - Archivist Transfers: 4-To toilet/BSC: Min A (steadying Pt. > 75%);4-From toilet/BSC: Min A (steadying Pt. > 75%)  FIM - Bed/Chair Transfer Bed/Chair Transfer: 4: Bed > Chair or W/C: Min A (steadying Pt. > 75%);4: Chair or W/C > Bed: Min A (steadying Pt. > 75%)  FIM - Locomotion: Wheelchair Locomotion: Wheelchair: 0: Activity did not occur FIM - Locomotion: Ambulation Ambulation/Gait Assistance: 4: Min guard Locomotion: Ambulation: 4: Travels 150 ft or more with minimal assistance (Pt.>75%)  Comprehension Comprehension Mode: Auditory Comprehension: 5-Understands complex 90% of the time/Cues < 10%  of the time  Expression Expression Mode: Verbal Expression: 7-Expresses complex ideas: With no assist  Social Interaction Social Interaction: 7-Interacts appropriately with others - No medications needed.  Problem Solving Problem Solving: 6-Solves complex problems: With extra time  Memory Memory: 6-More than reasonable amt of  time  Medical Problem List and Plan:  1. Cervical stenosis with myelopathy/central cord injury. Status post C3, 4-5 decompression and fusion  2. DVT Prophylaxis/Anticoagulation: SCDs. Patient is ambulatory.  3. Pain Management: Percocet, Robaxin as needed. Monitor with increased mobility  4. Neuropsych: This patient is capable of making decisions on his own behalf.  5. Diabetes mellitus with peripheral neuropathy. Blood sugar 140 to 160s. Monitor while on Decadron taper. Continue Glucophage 1000 mg twice a day   -changed decadron to po and decreased yesterday  -cover with SSI as we taper steroids 6. Hypertension. Lasix 40 mg daily, Cozaar 100 mg daily, Toprol-XL 50 mg daily. Monitor with increased mobility  7. Question chronic dysphasia. Followup speech therapy. Advance diet as tolerated. Monitor for any signs of aspiration   LOS (Days) 3 A FACE TO FACE EVALUATION WAS PERFORMED  SWARTZ,[name] 10/16/2012 8:19 AM

## 2012-10-16 NOTE — Progress Notes (Signed)
Physical Therapy Session Note  Patient Details  Name: Dale Key MRN: [id number] Date of Birth: [birth year]  Today's Date: 10/16/2012 Time:  -   Skilled Therapeutic Interventions/Progress Updates:   Session I: 0800-0900 Patient sitting up in his room, asked for few minutes to finish his breakfast ,but willing to participate in therapy session.Gait training in the room with focus on maneuvering around obstacles, ambulated to/from therapy gym with supervision with SCP and verbal cues for step length and continuous gait. Nu Step training 1 x 12 min to increase strength, endurance and reciprocal coordination. Dynamic standing activities to increase endurance and balance, including stepping up, single limb support , turning and reaching in all directions-obstacle courses. Exercises for B UE with 1.5 lbs-biceps curls, straight arm raise and horizontal abd. Sitting push ups and scooting to increase triceps strength.  Session II : 1300-1330 Gait training with SPC to and from gym with varying speed and tandem walking incorporated. Balance training using side stepping strategies, bouncing the ball and [employer] (maze and catch game) Stairs 2 x 10 with Supervision occ min A when descending.   Therapy Documentation Precautions:  Precautions Precautions: Fall;Cervical Restrictions Weight Bearing Restrictions: No Vital Signs: Therapy Vitals Temp: 97.8 F (36.6 C) Temp src: Oral Pulse Rate: 60 Resp: 20 BP: 112/66 mmHg Patient Position, if appropriate: Sitting Oxygen Therapy SpO2: 100 % O2 Device: None (Room air)  See FIM for current functional status  Therapy/Group: Individual Therapy  Dorna Mai 10/16/2012, 8:00 AM

## 2012-10-16 NOTE — Progress Notes (Signed)
Occupational Therapy Session Note  Patient Details  Name: Elric Tirado MRN: [id number] Date of Birth: 09-23-1955  Today's Date: 10/16/2012 Time: 5284-1324 Time Calculation (min): 60 min  Skilled Therapeutic Interventions/Progress Updates: Patient completed ADL in room shower, including functional mobility and endurance to gather clothing and use B UEs as much as possible for self care.   Patient demonstrated willingness participate as independently as he could in self care but complained of neck, shoulder and overall fatigue - especially when using B UEs.   When allowed to rest his UE and neck muscles, he was able to better complete the tasks he attempted even though his strength and ROM was limited.  He exhibited greater ROM and strength for use with his R UE today.    As well, patient c/o that his left 3rd toe nail "is giving me a fit."   He was encouraged to mention this concern to his nurse and doctor so that they are aware and perhaps can provide proper advisement.  Therapy Documentation Precautions:  Precautions Precautions: Fall;Cervical Restrictions Weight Bearing Restrictions: No   Pain:  Not rated but c/o burning pain in bilateral neck and shoulders with use   See FIM for current functional status  Therapy/Group: Individual Therapy  Bud Face [facility] 10/16/2012, 12:25 PM

## 2012-10-17 ENCOUNTER — Inpatient Hospital Stay (HOSPITAL_COMMUNITY): Payer: BC Managed Care – PPO | Admitting: Speech Pathology

## 2012-10-17 ENCOUNTER — Inpatient Hospital Stay (HOSPITAL_COMMUNITY): Payer: BC Managed Care – PPO | Admitting: Occupational Therapy

## 2012-10-17 ENCOUNTER — Inpatient Hospital Stay (HOSPITAL_COMMUNITY): Payer: BC Managed Care – PPO

## 2012-10-17 ENCOUNTER — Encounter (HOSPITAL_COMMUNITY): Payer: Self-pay

## 2012-10-17 DIAGNOSIS — G8250 Quadriplegia, unspecified: Secondary | ICD-10-CM

## 2012-10-17 LAB — GLUCOSE, CAPILLARY
Glucose-Capillary: 182 mg/dL — ABNORMAL HIGH (ref 70–99)
Glucose-Capillary: 207 mg/dL — ABNORMAL HIGH (ref 70–99)
Glucose-Capillary: 195 mg/dL — ABNORMAL HIGH (ref 70–99)
Glucose-Capillary: 266 mg/dL — ABNORMAL HIGH (ref 70–99)

## 2012-10-17 NOTE — Progress Notes (Signed)
Inpatient Diabetes Program Recommendations  AACE/ADA: New Consensus Statement on Inpatient Glycemic Control (2013)  Target Ranges:  Prepandial:   less than 140 mg/dL      Peak postprandial:   less than 180 mg/dL (1-2 hours)      Critically ill patients:  140 - 180 mg/dL  Results for JENKINS, RISDON (MRN [id number]) as of 10/17/2012 13:30  Ref. Range 10/16/2012 11:15 10/16/2012 17:08 10/16/2012 20:25 10/17/2012 07:23 10/17/2012 11:27  Glucose-Capillary Latest Range: 70-99 mg/dL 409 (H) 811 (H) 914 (H) 182 (H) 207 (H)   Inpatient Diabetes Program Recommendations Insulin - Meal Coverage: consider adding Novolog 4 units TID with meals for elevated postprandial elevations Thank you  Piedad Climes BSN, RN,CDE Inpatient Diabetes Coordinator [phone] (team pager)

## 2012-10-17 NOTE — Progress Notes (Signed)
Occupational Therapy Session Notes  Patient Details  Name: Dale Key MRN: [id number] Date of Birth: 30-Mar-1955  Today's Date: 10/17/2012  Short Term Goals: Week 1:  OT Short Term Goal 1 (Week 1): Short Term Goals = Long Term Goals  Skilled Therapeutic Interventions/Progress Updates:   Session #1 [id number] - 59 Minutes Individual Therapy No complaints of pain, just "tired from PT" Patient found seated in recliner upon entering room. Patient with request to use urinal, therapist gave patient some privacy. From here, patient stood without an AD and ambulated throughout room in order to gather necessary items for his ADL this am. Patient doffed LB clothing in standing position, then sat on tub transfer bench in order to doff shirt. Therapist covered IV site on his hand. From here, patient completed UB/LB bathing at shower level in sit<>stand position. After shower, patient attempted to stand in order to donn pants. Therapist encouraged patient to sit to complete LB dressing for safety, patient agreed. Patient then ambulated to recliner for UB/LB dressing in sit<>stand position. Patient with complaints of fatigue throughout session. Left patient seated in recliner at end of session with call bell & phone within reach.   Session #2 [id number] - 45 Minutes Individual Therapy No complaints of pain Patient found seated in recliner. Patient stood in order to donn suspenders with maximal assistance from therapist. Patient then ambulated from room -> therapy gym. Therapist educated patient on BUE ROM exercises in supine position using 1lb weighted bar. Exercises focused on bilateral shoulders and therapist encouraged patient to complete these in supine using cane and completing 3 sets of 10 reps for each exercises at least twice a day. Patient then stood to complete behind the back shoulder extension & external rotation exercises. Therapist administered theraputty for patient to use daily to  strengthening bilateral hands/fingers. Therapist also administered long handled sponge for patient to use in order to increase independence with LB bathing. Patient ambulated back to room and transferred into recliner. Call bell & phone left within reach.   Precautions:  Precautions Precautions: Fall;Cervical Restrictions Weight Bearing Restrictions: No  See FIM for current functional status  Dale Key 10/17/2012, 7:36 AM

## 2012-10-17 NOTE — Progress Notes (Signed)
Physical Therapy Session Note  Patient Details  Name: Dale Key MRN: [id number] Date of Birth: [birth year]  Today's Date: 10/17/2012 Time: 0830-0925 Time Calculation (min): 55 min  Short Term Goals: Week 1:  PT Short Term Goal 1 (Week 1): = LTGs  Skilled Therapeutic Interventions/Progress Updates:    Session focused on functional gait in room, dynamic gait on unit to challenge balance, simulated car transfer (S), stair negotiation (steady A to close S) and functional balance while completing UE exercises (pt states his arms are his big focus). Pt able to complete in standing shoulder flexion with weighted medicine ball (red) 3 sets of 10 reps each with overpressure by therapist to increase range; seated bicep curls with yellow weighted medicine ball x 3 sets of 10 reps. Pt overall S with mobility without AD; discussed possible use of SPC during community gait when on uneven surfaces but do not anticipate needing AD for gait in the home. Discussed ways to incorporate his music into therapy (especially for fine motor control) whether that is here on rehab or once he discharges home.    Therapy Documentation Precautions:  Precautions Precautions: Fall;Cervical Restrictions Weight Bearing Restrictions: No  Pain: Premedicated for pain.  See FIM for current functional status  Therapy/Group: Individual Therapy  Karolee Stamps [facility] 10/17/2012, 9:28 AM

## 2012-10-17 NOTE — Progress Notes (Signed)
Speech Language Pathology Daily Session Note  Patient Details  Name: Dale Key MRN: [id number] Date of Birth: 05-30-1955  Today's Date: 10/17/2012 Time: 1135-1215 Time Calculation (min): 40 min  Short Term Goals: Week 1: SLP Short Term Goal 1 (Week 1): Pt will utilize swallowing compensatory strategies with supervision visual cues to minimize overt s/s of aspiration. SLP Short Term Goal 2 (Week 1): Pt will demonstrate efficient mastication of regular textures without overt s/s of aspiration with Mod I.  SLP Short Term Goal 3 (Week 1): Pt will recall and utilize vocal hygeine strategies with Mod I.   Skilled Therapeutic Interventions: Treatment focus on dysphagia goals. SLP facilitated session by providing supervision verbal cues for utilization of small bites/sips throughout the session. Pt without overt s/s of aspiration throughout the meal. Pt also consumed trials of regular textures with a mild wet vocal quality that cleared with a spontaneous throat clear. Recommend to continue current diet at this time.    FIM:  Comprehension Comprehension Mode: Auditory Comprehension: 6-Follows complex conversation/direction: With extra time/assistive device Expression Expression Mode: Verbal Expression: 6-Expresses complex ideas: With extra time/assistive device Social Interaction Social Interaction: 6-Interacts appropriately with others with medication or extra time (anti-anxiety, antidepressant). Problem Solving Problem Solving: 6-Solves complex problems: With extra time Memory Memory: 6-More than reasonable amt of time FIM - Eating Eating Activity: 5: Supervision/cues  Pain Pain Assessment Pain Assessment: No/denies pain  Therapy/Group: Individual Therapy  Cleon Thoma 10/17/2012, 2:10 PM

## 2012-10-17 NOTE — Progress Notes (Signed)
Subjective/Complaints: Neck better. Concerned about toe nail on left foot.  A 12 point review of systems has been performed and if not noted above is otherwise negative.   Objective: Vital Signs: Blood pressure 102/63, pulse 68, temperature 97.3 F (36.3 C), temperature source Oral, resp. rate 20, height 5\' 9"  (1.753 m), weight 130.182 kg (287 lb), SpO2 100.00%. No results found. No results found for this basename: WBC, HGB, HCT, PLT,  in the last 72 hours No results found for this basename: NA, K, CL, CO, GLUCOSE, BUN, CREATININE, CALCIUM,  in the last 72 hours CBG (last 3)   Recent Labs  10/16/12 1708 10/16/12 2025 10/17/12 0723  GLUCAP 285* 309* 182*    Wt Readings from Last 3 Encounters:  10/13/12 130.182 kg (287 lb)  10/08/12 138.1 kg (304 lb 7.3 oz)  10/08/12 138.1 kg (304 lb 7.3 oz)    Physical Exam:  Blood pressure 113/64, pulse 61, temperature 98.5 F (36.9 C), temperature source Oral, resp. rate 15, height 5\' 9"  (1.753 m), weight 138.1 kg (304 lb 7.3 oz), SpO2 93.00%.  Constitutional: He is oriented to person, place, and time.  HENT: perrl  Head: Normocephalic.  Eyes: EOM are normal.  Neck: No thyromegaly present.  Cardiovascular: Normal rate and regular rhythm. No murmur or gallops  Pulmonary/Chest: Effort normal and breath sounds normal. No respiratory distress. No wheezes  Abdominal: Soft. Bowel sounds are normal. He exhibits no distension.  Obese  Neurological: He is alert and oriented to person, place, and time. Normal sensation over the clavicle.   some loss of FT over the distal UE's. Decreased FMC of hands.    RUE 2-/5 shoulder, 2- to 3/5 distally. LUE is 3- to 3 deltoid, bicep, tricep, hand. RLE is 3- prox at HF, 4-KE, 3 ankle. LLE is her/5 in the hip flexor knee extensor ankle dorsiflexor. DTR's 1+. Seems distracted, fair insight and awareness  Musc: shoulder ROM limited in ABD,Flex,ext, head forward posture, mild pain with palpation around C7 Skin:   Surgical site clean and dry. onchomyosis of toe nails. Psychiatric: He has a normal mood and affect.      Assessment/Plan: 1. Functional deficits secondary to C3-5 cord contsuion which require 3+ hours per day of interdisciplinary therapy in a comprehensive inpatient rehab setting. Physiatrist is providing close team supervision and 24 hour management of active medical problems listed below. Physiatrist and rehab team continue to assess barriers to discharge/monitor patient progress toward functional and medical goals. FIM: FIM - Bathing Bathing Steps Patient Completed: Chest;Abdomen;Front perineal area Bathing: 2: Max-Patient completes 3-4 36f 10 parts or 25-49%  FIM - Upper Body Dressing/Undressing Upper body dressing/undressing: 1: Total-Patient completed less than 25% of tasks FIM - Lower Body Dressing/Undressing Lower body dressing/undressing steps patient completed: Thread/unthread right underwear leg;Thread/unthread right pants leg Lower body dressing/undressing: 1: Total-Patient completed less than 25% of tasks  FIM - Toileting Toileting: 0: Activity did not occur  FIM - Archivist Transfers: 0-Activity did not occur  FIM - Banker Devices: Arm rests Bed/Chair Transfer: 5: Bed > Chair or W/C: Supervision (verbal cues/safety issues)  FIM - Locomotion: Wheelchair Locomotion: Wheelchair: 0: Activity did not occur FIM - Locomotion: Ambulation Locomotion: Ambulation Assistive Devices: Emergency planning/management officer Ambulation/Gait Assistance: 5: Supervision Locomotion: Ambulation: 5: Travels 150 ft or more with supervision/safety issues  Comprehension Comprehension Mode: Auditory Comprehension: 6-Follows complex conversation/direction: With extra time/assistive device  Expression Expression Mode: Verbal Expression: 7-Expresses complex ideas: With no assist  Social Interaction Social Interaction: 6-Interacts appropriately with  others with medication or extra time (anti-anxiety, antidepressant).  Problem Solving Problem Solving: 6-Solves complex problems: With extra time  Memory Memory: 6-More than reasonable amt of time  Medical Problem List and Plan:  1. Cervical stenosis with myelopathy/central cord injury. Status post C3, 4-5 decompression and fusion  2. DVT Prophylaxis/Anticoagulation: SCDs. Patient is ambulatory.  3. Pain Management: Percocet, Robaxin as needed. Monitor with increased mobility  4. Neuropsych: This patient is capable of making decisions on his own behalf.  5. Diabetes mellitus with peripheral neuropathy. Blood sugar 140 to 160s. Monitor while on Decadron taper. Continue Glucophage 1000 mg twice a day   -changed decadron to po and decreased yesterday---wean a little further today  -cover with SSI as we taper steroids 6. Hypertension. Lasix 40 mg daily, Cozaar 100 mg daily, Toprol-XL 50 mg daily. Monitor with increased mobility  7. Question chronic dysphasia. Followup speech therapy. Advance diet as tolerated. Monitor for any signs of aspiration  8. Urology: was due to see Dr. Brunilda Payor this past week for PSA checkk. Will call office to see what they would like to do. We could check PSA here. 9. Onchomycosis: file left 4th toe nail down to smoother consistency LOS (Days) 4 A FACE TO FACE EVALUATION WAS PERFORMED  Karina Nofsinger T 10/17/2012 8:07 AM

## 2012-10-17 NOTE — Progress Notes (Signed)
Spoke with Dr. Brunilda Payor of urology services in regards to following up routine steady PSA. Advised to followup after discharge from hospital for routine PSA studies this was not urgent in nature

## 2012-10-18 ENCOUNTER — Inpatient Hospital Stay (HOSPITAL_COMMUNITY): Payer: BC Managed Care – PPO

## 2012-10-18 ENCOUNTER — Inpatient Hospital Stay (HOSPITAL_COMMUNITY): Payer: BC Managed Care – PPO | Admitting: Occupational Therapy

## 2012-10-18 ENCOUNTER — Encounter (HOSPITAL_COMMUNITY): Payer: Self-pay | Admitting: Neurosurgery

## 2012-10-18 ENCOUNTER — Inpatient Hospital Stay (HOSPITAL_COMMUNITY): Payer: BC Managed Care – PPO | Admitting: Speech Pathology

## 2012-10-18 LAB — GLUCOSE, CAPILLARY
Glucose-Capillary: 204 mg/dL — ABNORMAL HIGH (ref 70–99)
Glucose-Capillary: 286 mg/dL — ABNORMAL HIGH (ref 70–99)
Glucose-Capillary: 189 mg/dL — ABNORMAL HIGH (ref 70–99)
Glucose-Capillary: 186 mg/dL — ABNORMAL HIGH (ref 70–99)

## 2012-10-18 MED ORDER — DEXAMETHASONE 4 MG PO TABS
4.0000 mg | ORAL_TABLET | Freq: Two times a day (BID) | ORAL | Status: DC
  Administered 2012-10-18 – 2012-10-19 (×3): 4 mg via ORAL
  Filled 2012-10-18 (×6): qty 1

## 2012-10-18 NOTE — Progress Notes (Signed)
Social Work Patient ID: Dale Key, male   DOB: 03-07-1955, 57 y.o.   MRN: [id number]  Met with pt this afternoon to review team conf.  Aware and agreeable with targeted d/c date of 10/9 @ modified independent level of function.  Also agreeable to recommended follow up outpatient rehab.  Mostly focused on issues with possible workers' comp claim (under "investigation") and assured pt that I will follow up with WC contact person I was given and report back to him tomorrow on status of claim.  No other concerns at this point.  Dale Layton, LCSW

## 2012-10-18 NOTE — Progress Notes (Signed)
Physical Therapy Session Note  Patient Details  Name: [name] MRN: [id number] Date of Birth: February 12, 1955  Today's Date: 10/18/2012 Time: 1030-1100 Time Calculation (min): 30 min  Short Term Goals: Week 1:  PT Short Term Goal 1 (Week 1): = LTGs  Skilled Therapeutic Interventions/Progress Updates:    Session focused on functional and community environment gait outdoors on uneven surfaces and navigating stairs outside using [facility] in community setting at overall S level; steady A for stairs as first time using cane and 1 rail. Pt also completed toileting in room prior to leaving mod I and required A to don shoes and sweater.   Therapy Documentation Precautions:  Precautions Precautions: Fall;Cervical Restrictions Weight Bearing Restrictions: No  Pain:  Denies pain.  See FIM for current functional status  Therapy/Group: Individual Therapy  Karolee Stamps [facility] 10/18/2012, 11:16 AM

## 2012-10-18 NOTE — Progress Notes (Signed)
Inpatient Diabetes Program Recommendations  AACE/ADA: New Consensus Statement on Inpatient Glycemic Control (2013)  Target Ranges:  Prepandial:   less than 140 mg/dL      Peak postprandial:   less than 180 mg/dL (1-2 hours)      Critically ill patients:  140 - 180 mg/dL   Hyperglycemia while on steroid therapy.  Inpatient Diabetes Program Recommendations Insulin - Basal: Please consider adding basal lantus to present regimen while on Decadron. 10 units Lantus Insulin - Meal Coverage: Pt may also need some meal coverage as 4 units tidwc (decrease correction to moderate if add meal coverage). Meal coverage prevents need fo large doses of correction.  Thank you, Lenor Coffin, RN, CNS, Diabetes Coordinator [phone])

## 2012-10-18 NOTE — Progress Notes (Signed)
Occupational Therapy Session Notes  Patient Details  Name: Dale Key MRN: [id number] Date of Birth: 07-11-1955  Today's Date: 10/18/2012  Short Term Goals: Week 1:  OT Short Term Goal 1 (Week 1): Short Term Goals = Long Term Goals  Skilled Therapeutic Interventions/Progress Updates:   Session #1 0930-1030 - 60 Minutes Individual Therapy No complaints of pain Patient found seated in recliner. Patient stood and gathered necessary items for his ADL. Patient then ambulated -> tub room on 4 [place]. Patient stepped over tub/shower threshold and performed UB/LB bathing using tub transfer bench and long handled sponge for feet and head; patient did require min assist for buttock. Patient sat in bed side chair in bathroom to complete UB/LB dressing tasks. Therapist educated patient on compensatory strategies for UB dressing; right arm, head, then left arm technique in standing position worked the best. Patient uses walls to compensate for weakness throughout BUEs. Patient ambulated back to room and sat in recliner. Call bell & phone were left within reach.   Session #2 1300-1400 - 60 Minutes Individual Therapy Patient with some complaints of pain in neck, no medication wanted at this time. Patient stood from recliner and ambulated -> dayroom. In dayroom, patient engaged in fine motor task of putting beads in yellow theraputty. Therapist encouraged fine motor task/exercise/HEP of getting beads out of putty and putting them back in. Patient ambulated -> therapy gym from dayroom and engaged in ergometer exercise for 20 minutes on random and level 15. Patient stood for 5 minutes then sat in chair for 15 minutes, therapist encouraged patient to sit so he could get more ROM -> BUEs. After ergometer exercise, patient stood at high/low table with 3lb weights on bilateral wrists and performed 3 sets of 10 external/internal shoulder exercises. Patient ambulated back to room and left seated in recliner with  call bell & phone within reach. Patient eager to go home Thursday.   Precautions:  Precautions Precautions: Fall;Cervical Restrictions Weight Bearing Restrictions: No  See FIM for current functional status  Zaccheaus Storlie 10/18/2012, 7:27 AM

## 2012-10-18 NOTE — Progress Notes (Signed)
Subjective/Complaints: No new issues. Slept well. Wearing oxygen at night..  A 12 point review of systems has been performed and if not noted above is otherwise negative.   Objective: Vital Signs: Blood pressure 113/65, pulse 60, temperature 97 F (36.1 C), temperature source Oral, resp. rate 21, height 5\' 9"  (1.753 m), weight 130.182 kg (287 lb), SpO2 99.00%. No results found. No results found for this basename: WBC, HGB, HCT, PLT,  in the last 72 hours No results found for this basename: NA, K, CL, CO, GLUCOSE, BUN, CREATININE, CALCIUM,  in the last 72 hours CBG (last 3)   Recent Labs  10/17/12 1638 10/17/12 2107 10/18/12 0737  GLUCAP 195* 266* 204*    Wt Readings from Last 3 Encounters:  10/13/12 130.182 kg (287 lb)  10/08/12 138.1 kg (304 lb 7.3 oz)  10/08/12 138.1 kg (304 lb 7.3 oz)    Physical Exam:  Blood pressure 113/64, pulse 61, temperature 98.5 F (36.9 C), temperature source Oral, resp. rate 15, height 5\' 9"  (1.753 m), weight 138.1 kg (304 lb 7.3 oz), SpO2 93.00%.  Constitutional: He is oriented to person, place, and time.  HENT: perrl  Head: Normocephalic.  Eyes: EOM are normal.  Neck: No thyromegaly present.  Cardiovascular: Normal rate and regular rhythm. No murmur or gallops  Pulmonary/Chest: Effort normal and breath sounds normal. No respiratory distress. No wheezes  Abdominal: Soft. Bowel sounds are normal. He exhibits no distension.  Obese  Neurological: He is alert and oriented to person, place, and time. Normal sensation over the clavicle.   some loss of FT over the distal UE's. Decreased FMC of hands.    RUE 2-/5 shoulder, 2- to 3/5 distally. LUE is 3- to 3 deltoid, bicep, tricep, hand. RLE is 3- prox at HF, 4-KE, 3 ankle. LLE is her/5 in the hip flexor knee extensor ankle dorsiflexor. DTR's 1+. Seems distracted, fair insight and awareness  Musc: shoulder ROM limited in ABD,Flex,ext, head forward posture, mild pain with palpation around C7 Skin:   Surgical site clean and dry. onchomyosis of toe nails. Psychiatric: He has a normal mood and affect.      Assessment/Plan: 1. Functional deficits secondary to C3-5 cord contsuion which require 3+ hours per day of interdisciplinary therapy in a comprehensive inpatient rehab setting. Physiatrist is providing close team supervision and 24 hour management of active medical problems listed below. Physiatrist and rehab team continue to assess barriers to discharge/monitor patient progress toward functional and medical goals. FIM: FIM - Bathing Bathing Steps Patient Completed: Chest;Abdomen;Front perineal area;Right upper leg;Left upper leg Bathing: 0: Activity did not occur  FIM - Upper Body Dressing/Undressing Upper body dressing/undressing steps patient completed: Thread/unthread right sleeve of pullover shirt/dresss;Thread/unthread left sleeve of pullover shirt/dress Upper body dressing/undressing: 0: Activity did not occur FIM - Lower Body Dressing/Undressing Lower body dressing/undressing steps patient completed: Thread/unthread right pants leg;Thread/unthread left pants leg Lower body dressing/undressing: 0: Activity did not occur  FIM - Toileting Toileting: 0: Activity did not occur  FIM - Archivist Transfers: 0-Activity did not occur  FIM - Banker Devices: Arm rests Bed/Chair Transfer: 0: Activity did not occur  FIM - Locomotion: Wheelchair Locomotion: Wheelchair: 0: Activity did not occur FIM - Locomotion: Ambulation Locomotion: Ambulation Assistive Devices: Emergency planning/management officer Ambulation/Gait Assistance: 5: Supervision Locomotion: Ambulation: 0: Activity did not occur  Comprehension Comprehension Mode: Auditory Comprehension: 6-Follows complex conversation/direction: With extra time/assistive device  Expression Expression Mode: Verbal Expression: 6-Expresses complex ideas: With extra  time/assistive device  Social  Interaction Social Interaction: 6-Interacts appropriately with others with medication or extra time (anti-anxiety, antidepressant).  Problem Solving Problem Solving: 6-Solves complex problems: With extra time  Memory Memory: 6-More than reasonable amt of time  Medical Problem List and Plan:  1. Cervical stenosis with myelopathy/central cord injury. Status post C3, 4-5 decompression and fusion  2. DVT Prophylaxis/Anticoagulation: SCDs. Patient is ambulatory.  3. Pain Management: Percocet, Robaxin as needed. Monitor with increased mobility  4. Neuropsych: This patient is capable of making decisions on his own behalf.  5. Diabetes mellitus with peripheral neuropathy. Blood sugar 140 to 160s. Monitor while on Decadron taper. Continue Glucophage 1000 mg twice a day   -weaning steroids  -cover with SSI as we taper steroids 6. Hypertension. Lasix 40 mg daily, Cozaar 100 mg daily, Toprol-XL 50 mg daily. Monitor with increased mobility  7. Question chronic dysphasia. Followup speech therapy. Advance diet as tolerated. Monitor for any signs of aspiration  8. Urology: follow up with Dr. Brunilda Payor as an outpt for PSA 9. Onychomycosis: file left 4th toe nail down to smoother consistency LOS (Days) 5 A FACE TO FACE EVALUATION WAS PERFORMED  Jiyah Torpey T 10/18/2012 8:00 AM

## 2012-10-18 NOTE — Patient Care Conference (Signed)
Inpatient RehabilitationTeam Conference and Plan of Care Update Date: 10/18/2012   Time: 2:10 PM    Patient Name: Dale Key      Medical Record Number: [id number]  Date of Birth: [birth year] Sex: Male         Room/Bed: 4W06C/4W06C-01 Payor Info: Payor: [employer]UE [employer]UE [employer] / Plan: BCBS [place] PPO / Product Type: *No Product type* /    Admitting Diagnosis: CENTRAL CORD  Admit Date/Time:  10/13/2012  3:28 PM Admission Comments: No comment available   Primary Diagnosis:  Central cord syndrome Principal Problem: Central cord syndrome  Patient Active Problem List   Diagnosis Date Noted  . Central cord syndrome 10/13/2012    Expected Discharge Date: Expected Discharge Date: 10/20/12  Team Members Present: Physician leading conference: Dr. Faith Rogue Social Worker Present: Amada Jupiter, LCSW Nurse Present: Leland Johns, RN PT Present: Karolee Stamps, Jerrye Bushy, PT OT Present: Edwin Cap, OT SLP Present: Feliberto Gottron, SLP PPS Coordinator present : Tora Duck, RN, CRRN     Current Status/Progress Goal Weekly Team Focus  Medical   central cord injury after fall, contusion. spondylosis, OSA, dysphagia  improve strength, pain control  finalize medical issues for dc planning   Bowel/Bladder   pt is continent of bowel and bladder.  maintain continence  monitor   Swallow/Nutrition/ Hydration   Regular textures with thin liquids, intermittent supervision-Mod I  Mod I  education    ADL's   mod assist for UB bathing & dressing, max assist for LB bathing & dressing, distant supervision for functional ambulation/motibility & transfers  overall mod I  functional use of BUEs, strengthening to BUEs, ROM to BUEs, overall activity tolerance/endurance   Mobility   overall S with transfers and gait; close S to steady A for stairs  overall mod I; S for stairs for home entry  endurance/activity tolerance, dynamic balance, functional use of BUE's, dynamic gait   Communication     Safety/Cognition/ Behavioral Observations            Pain   no complaints of pain  keep the pt pain free or pain level below 3 if there is pain.  assess for pain and the effectiveness of pain management.   Skin   surgical site on the left neck with steristrip intact  keep the surgical site free from infection and prevent new  skin breakdown  assess for skin integrity every shift.    Rehab Goals Patient on target to meet rehab goals: Yes *See Care Plan and progress notes for long and short-term goals.  Barriers to Discharge: use of UE"s     Possible Resolutions to Barriers:  adaptive equipment, pacing,     Discharge Planning/Teaching Needs:  home with intermittent assistance of sister and girlfriend      Team Discussion:  Very good progress this week and reaching modified independent goals.  stil with functional impairment in UE/ shoulder.  Upgrading diet to regular.  Recommending OP therapy.  Revisions to Treatment Plan:  none   Continued Need for Acute Rehabilitation Level of Care: The patient requires daily medical management by a physician with specialized training in physical medicine and rehabilitation for the following conditions: Daily direction of a multidisciplinary physical rehabilitation program to ensure safe treatment while eliciting the highest outcome that is of practical value to the patient.: Yes Daily medical management of patient stability for increased activity during participation in an intensive rehabilitation regime.: Yes Daily analysis of laboratory values and/or radiology reports with any  subsequent need for medication adjustment of medical intervention for : Post surgical problems;Neurological problems  Pernella Ackerley 10/18/2012, 6:47 PM

## 2012-10-18 NOTE — Progress Notes (Signed)
At 1500 B/P 94/56 per NT document. Patient is asymptomatic, sitting up in the chair. Reassessed patient at 1830 B/P 95/56. Will continue to monitor.

## 2012-10-18 NOTE — Progress Notes (Signed)
Speech Language Pathology Daily Session Note  Patient Details  Name: Dale Key MRN: [id number] Date of Birth: January 07, 1956  Today's Date: 10/18/2012 Time: 1130-1210 Time Calculation (min): 40 min  Short Term Goals: Week 1: SLP Short Term Goal 1 (Week 1): Pt will utilize swallowing compensatory strategies with supervision visual cues to minimize overt s/s of aspiration. SLP Short Term Goal 2 (Week 1): Pt will demonstrate efficient mastication of regular textures without overt s/s of aspiration with Mod I.  SLP Short Term Goal 3 (Week 1): Pt will recall and utilize vocal hygeine strategies with Mod I.   Skilled Therapeutic Interventions: Treatment focus on dysphagia goals. SLP facilitated session by providing skilled observation for diet tolerance with upgraded lunch meal of regular textures. Pt required assistance cutting his sandwich but was Mod I for utilization of compensatory strategies. Pt demonstrated an intermittent wet vocal quality throughout the meal that pt cleared with a spontaneous throat clear, however, pt consistently has a wet vocal quality throughout the day, therefore, difficult to differentiate between penetration vs. mucous.  Continue with current plan of care.    FIM:  Comprehension Comprehension Mode: Auditory Comprehension: 6-Follows complex conversation/direction: With extra time/assistive device Expression Expression Mode: Verbal Expression: 6-Expresses complex ideas: With extra time/assistive device Social Interaction Social Interaction: 6-Interacts appropriately with others with medication or extra time (anti-anxiety, antidepressant). Problem Solving Problem Solving: 6-Solves complex problems: With extra time Memory Memory: 6-More than reasonable amt of time FIM - Eating Eating Activity: 6: Swallowing techniques: self-managed  Pain Pain Assessment Pain Assessment: No/denies pain  Therapy/Group: Individual Therapy  Breiana Stratmann 10/18/2012, 2:24  PM

## 2012-10-19 ENCOUNTER — Inpatient Hospital Stay (HOSPITAL_COMMUNITY): Payer: BC Managed Care – PPO | Admitting: Speech Pathology

## 2012-10-19 ENCOUNTER — Inpatient Hospital Stay (HOSPITAL_COMMUNITY): Payer: BC Managed Care – PPO | Admitting: Occupational Therapy

## 2012-10-19 ENCOUNTER — Inpatient Hospital Stay (HOSPITAL_COMMUNITY): Payer: BC Managed Care – PPO

## 2012-10-19 LAB — GLUCOSE, CAPILLARY: Glucose-Capillary: 239 mg/dL — ABNORMAL HIGH (ref 70–99)

## 2012-10-19 NOTE — Progress Notes (Signed)
Subjective/Complaints: Slept well. No oxygen last night.  A 12 point review of systems has been performed and if not noted above is otherwise negative.   Objective: Vital Signs: Blood pressure 100/63, pulse 70, temperature 97.6 F (36.4 C), temperature source Oral, resp. rate 20, height 5\' 9"  (1.753 m), weight 130.182 kg (287 lb), SpO2 97.00%. No results found. No results found for this basename: WBC, HGB, HCT, PLT,  in the last 72 hours No results found for this basename: NA, K, CL, CO, GLUCOSE, BUN, CREATININE, CALCIUM,  in the last 72 hours CBG (last 3)   Recent Labs  10/18/12 1623 10/18/12 2054 10/19/12 0733  GLUCAP 189* 186* 179*    Wt Readings from Last 3 Encounters:  10/13/12 130.182 kg (287 lb)  10/08/12 138.1 kg (304 lb 7.3 oz)  10/08/12 138.1 kg (304 lb 7.3 oz)    Physical Exam:  Blood pressure 113/64, pulse 61, temperature 98.5 F (36.9 C), temperature source Oral, resp. rate 15, height 5\' 9"  (1.753 m), weight 138.1 kg (304 lb 7.3 oz), SpO2 93.00%.  Constitutional: He is oriented to person, place, and time.  HENT: perrl  Head: Normocephalic.  Eyes: EOM are normal.  Neck: No thyromegaly present.  Cardiovascular: Normal rate and regular rhythm. No murmur or gallops  Pulmonary/Chest: Effort normal and breath sounds normal. No respiratory distress. No wheezes  Abdominal: Soft. Bowel sounds are normal. He exhibits no distension.  Obese  Neurological: He is alert and oriented to person, place, and time. Normal sensation over the clavicle.   some loss of FT over the distal UE's. Decreased FMC of hands.    RUE 2/5 shoulder, 2+ to 3/5 distally. LUE is 3+ deltoid, bicep, tricep, hand. RLE is 3 prox at HF, 4-KE, 3+ to 4 ankle. LLE is her/5 in the hip flexor knee extensor ankle dorsiflexor. DTR's 1+. Seems distracted, fair insight and awareness  Musc: shoulder ROM limited in ABD,Flex,ext, head forward posture, mild pain with palpation around C7 Skin:  Surgical site clean  and dry. onychomyosis of toe nails. Psychiatric: He has a normal mood and affect.      Assessment/Plan: 1. Functional deficits secondary to C3-5 cord contsuion which require 3+ hours per day of interdisciplinary therapy in a comprehensive inpatient rehab setting. Physiatrist is providing close team supervision and 24 hour management of active medical problems listed below. Physiatrist and rehab team continue to assess barriers to discharge/monitor patient progress toward functional and medical goals. FIM: FIM - Bathing Bathing Steps Patient Completed: Chest;Right Arm;Left Arm;Abdomen;Front perineal area;Right upper leg;Left upper leg;Right lower leg (including foot);Left lower leg (including foot) Bathing: 4: Min-Patient completes 8-9 20f 10 parts or 75+ percent (using long handled sponge)  FIM - Upper Body Dressing/Undressing Upper body dressing/undressing steps patient completed: Thread/unthread right sleeve of pullover shirt/dresss;Thread/unthread left sleeve of pullover shirt/dress;Pull shirt over trunk Upper body dressing/undressing: 4: Min-Patient completed 75 plus % of tasks FIM - Lower Body Dressing/Undressing Lower body dressing/undressing steps patient completed: Thread/unthread right pants leg;Thread/unthread left pants leg;Pull pants up/down;Don/Doff right sock Lower body dressing/undressing: 4: Min-Patient completed 75 plus % of tasks  FIM - Toileting Toileting: 0: Activity did not occur  FIM - Archivist Transfers: 0-Activity did not occur  FIM - Banker Devices: Arm rests Bed/Chair Transfer: 0: Activity did not occur  FIM - Locomotion: Wheelchair Locomotion: Wheelchair: 0: Activity did not occur FIM - Locomotion: Ambulation Locomotion: Ambulation Assistive Devices: Emergency planning/management officer Ambulation/Gait Assistance: 5: Supervision Locomotion: Ambulation:  5: Travels 150 ft or more with supervision/safety  issues  Comprehension Comprehension Mode: Auditory Comprehension: 6-Follows complex conversation/direction: With extra time/assistive device  Expression Expression Mode: Verbal Expression: 6-Expresses complex ideas: With extra time/assistive device  Social Interaction Social Interaction: 6-Interacts appropriately with others with medication or extra time (anti-anxiety, antidepressant).  Problem Solving Problem Solving: 6-Solves complex problems: With extra time  Memory Memory: 6-More than reasonable amt of time  Medical Problem List and Plan:  1. Cervical stenosis with myelopathy/central cord injury. Status post C3, 4-5 decompression and fusion  2. DVT Prophylaxis/Anticoagulation: SCDs. Patient is ambulatory.  3. Pain Management: Percocet, Robaxin as needed. Monitor with increased mobility  4. Neuropsych: This patient is capable of making decisions on his own behalf.  5. Diabetes mellitus with peripheral neuropathy.   Monitor while on Decadron taper. Continue Glucophage 1000 mg twice a day   -weaning steroids/sugars beginning to fall  -cover with SSI as we taper steroids  -not anxious to add another agent given his hx of non-compliance 6. Hypertension. Lasix 40 mg daily, Cozaar 100 mg daily, Toprol-XL 50 mg daily. Monitor with increased mobility  7. Question chronic dysphasia. Followup speech therapy. Advance diet as tolerated. Monitor for any signs of aspiration  8. Urology: follow up with Dr. Brunilda Payor as an outpt for PSA 9. Onychomycosis: filed left 4th toe nail down to smoother consistency LOS (Days) 6 A FACE TO FACE EVALUATION WAS PERFORMED  Linetta Regner T 10/19/2012 8:11 AM

## 2012-10-19 NOTE — Progress Notes (Signed)
Speech Language Pathology Daily Session Note  Patient Details  Name: Dale Key MRN: [id number] Date of Birth: 09/19/1955  Today's Date: 10/19/2012 Time: 0725-0755 Time Calculation (min): 30 min  Short Term Goals: Week 1: SLP Short Term Goal 1 (Week 1): Pt will utilize swallowing compensatory strategies with supervision visual cues to minimize overt s/s of aspiration. SLP Short Term Goal 2 (Week 1): Pt will demonstrate efficient mastication of regular textures without overt s/s of aspiration with Mod I.  SLP Short Term Goal 3 (Week 1): Pt will recall and utilize vocal hygeine strategies with Mod I.   Skilled Therapeutic Interventions: Treatment focus on dysphagia goals. SLP facilitated session with skilled observation for diet tolerance. Pt consumed current diet of regular textures with thin liquids with cough X 1, suspect due to large piece of dry meat.  Pt also continues to demonstrate a wet vocal quality that clears with a spontaneous throat clear. Pt was Mod I for utilization of swallowing compensatory strategies. Recommend to continue current diet.    FIM:  Comprehension Comprehension Mode: Auditory Comprehension: 6-Follows complex conversation/direction: With extra time/assistive device Expression Expression Mode: Verbal Expression: 6-Expresses complex ideas: With extra time/assistive device Social Interaction Social Interaction: 6-Interacts appropriately with others with medication or extra time (anti-anxiety, antidepressant). Problem Solving Problem Solving: 6-Solves complex problems: With extra time Memory Memory: 6-More than reasonable amt of time FIM - Eating Eating Activity: 6: Swallowing techniques: self-managed  Pain Pain Assessment Pain Assessment: No/denies pain  Therapy/Group: Individual Therapy  [name] 10/19/2012, 7:59 AM

## 2012-10-19 NOTE — Progress Notes (Addendum)
Occupational Therapy Session Notes  Patient Details  Name: Dale Key MRN: [id number] Date of Birth: 10-20-1955  Today's Date: 10/19/2012  Short Term Goals: Week 1:  OT Short Term Goal 1 (Week 1): Short Term Goals = Long Term Goals  Skilled Therapeutic Interventions/Progress Updates:   Session #1 [phone] - 71 Minutes Individual Therapy No complaints of pain Patient found seated in recliner. Therapist encouraged patient to complete ADL at mod I level; therefore patient ambulated to gather all necessary items and performed toilet transfer, toileting, shower transfer, UB/LB bathing, UB/LB dressing all at independent to mod I level. Patient with comment, "I feel like I have more mobility in my arms!". Patient takes more than reasonable amount of time to complete tasks, but does so in a safe and effective manner. At end of session, patient was left seated in recliner and made independent within room and hallway.   Session #2 [id number] -55 Minutes Individual Therapy No complaints of pain Patient found seated in recliner. Patient ambulated from room -> therapy gym. Once in therapy gym, patient engaged in his HEP using cane independently (in standing and supine positions). Therapist performed ROM and MMT testing. Patient then engaged in exercise using ergometer machine for 20 minutes to improve overall ROM throughout bilateral shoulders. Patient ambulated back to room by himself.   Precautions:  Precautions Precautions: Fall;Cervical Restrictions Weight Bearing Restrictions: No  See FIM for current functional status  Tyneka Scafidi 10/19/2012, 7:31 AM

## 2012-10-19 NOTE — Discharge Summary (Signed)
NAMEGOVIND, FUREY NO.:  [id number]  MEDICAL RECORD NO.:  [id number]  LOCATION:  [id number]                        FACILITY:  [facility]  PHYSICIAN:  Ranelle Oyster, M.D.DATE OF BIRTH:  March 12, 1955  DATE OF ADMISSION:  10/13/2012 DATE OF DISCHARGE:  10/20/2012                              DISCHARGE SUMMARY   DISCHARGE DIAGNOSES: 1. Cervical stenosis with myelopathy, central cord syndrome status     post decompression and fusion. 2. Sequential compression devices for deep vein thrombosis     prophylaxis. 3. Pain management. 4. Diabetes mellitus with peripheral neuropathy. 5. Hypertension. 6. Chronic dysphagia. 7. Benign prostatic hyperplasia. 8. Onychomycosis.  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  There was no loss of consciousness.  The patient with complaints of tingling and numbness in his hands and legs as well as difficulty with ambulation.  MRI cervical spine showed acute cord contusion, no epidural hematoma with severe stenosis at C3-4 and 4 5. Underwent anterior cervical diskectomy, decompression of spinal cord, interbody fusion October 08, 2012, per Dr. Jeral Fruit.  Postoperative pain management, Decadron protocol as directed.  Physical and occupational therapy ongoing.  The patient was admitted for comprehensive rehab program.  PAST MEDICAL HISTORY:  See discharge diagnoses.  SOCIAL HISTORY:  Lives alone with a male companion as assistance.  FUNCTIONAL HISTORY PRIOR TO ADMISSION:  Independent.  FUNCTIONAL STATUS PRIOR TO ADMISSION:  Minimal assist to ambulate 110 feet with a rolling walker and/or a straight cane.  PHYSICAL EXAMINATION:  VITAL SIGNS:  Blood pressure 113/64, pulse 61, temperature 98.5, respirations 15. GENERAL:  This was an alert male,  oriented x3. HEENT:  Pupils were round and reactive to light. LUNGS:  Clear to auscultation. CARDIAC:  Regular rate and rhythm. ABDOMEN:  Obese, soft, nontender.  Good bowel sounds.  Surgical site clean and dry with Steri-Strips.  REHABILITATION HOSPITAL COURSE:  The patient was admitted to Inpatient Rehab Services with therapies initiated on a 3-hour daily basis consisting of physical therapy, occupational therapy, and rehabilitation nursing.  The following issues were addressed during the patient's rehabilitation stay.  Pertaining to Mr. Guyton's cervical stenosis with myelopathy and central cord injury, he had undergone C3, C4 and C5 decompression and fusion per Neurosurgery, Dr. Jeral Fruit.  Surgical site healing nicely.  Therapies continued to progress.  He was weaned from his Decadron therapy.  Sequential compression devices in place for DVT prophylaxis.  He was ambulatory.  Pain management with the use of Robaxin as well as Percocet with good results.  He did have a history of diabetes mellitus with peripheral neuropathy, close monitoring of blood sugars while on Decadron taper.  He continued on Glucophage.  Blood pressures were well controlled with no orthostatic changes.  He would follow up with Dr. Sharyn Lull.  He was maintained on a regular consistency diet with no signs of aspiration.  He did have a history of BPH.  He followed up routinely with Urology Services, Dr. Brunilda Payor for followup PSA levels.  He did have a mild onychomycosis  of the left toenail bed that was filed down to a smoother consistency with good results.  The patient received weekly collaborative interdisciplinary team conferences to discuss estimated length of stay, family teaching, and any barriers to discharge.  He continued to progress nicely, navigating stairs outside, using a straight point cane in the community setting at overall supervision level and steady assist for stairs.  The patient completed toileting  in the room prior to leaving modified independent, required some assistance to don shoes and sweater.  He was advised no driving. Ongoing therapies would be dictated as per [employer].  DISCHARGE MEDICATIONS: 1. Azopt ophthalmic solution 1 drop both eyes t.i.d. 2. Decadron taper as directed. 3. Lasix 40 mg p.o. daily. 4. Xalatan ophthalmic solution 0.05% 1 drop both eyes at bedtime. 5. Cozaar 100 mg p.o. daily. 6. Glucophage 1000 mg p.o. b.i.d. 7. Robaxin 500 mg p.o. every 6 hours as needed for muscle spasms. 8. Toprol-XL 50 mg p.o. daily. 9. Percocet 5-325 mg 1 or 2 tablets every 4 hours as needed for     moderate pain. 10.Protonix 40 mg p.o. at bedtime. 11.Potassium chloride 20 mEq p.o. daily as needed.  DIET:  Diabetic diet.  SPECIAL INSTRUCTIONS:  The patient would follow up Dr. Hilda Lias, Neurosurgery 2 weeks call for appointment, Dr. Harold Hedge at the Outpatient Rehab Service office as needed, Dr. Sharyn Lull, medical management.  Special instructions, the patient was advised no driving.     Mariam Dollar, P.A.   ______________________________ Ranelle Oyster, M.D.    DA/MEDQ  D:  10/19/2012  T:  10/19/2012  Job:  [id number]  cc:   Hilda Lias, M.D. Eduardo Osier. Sharyn Lull, M.D.

## 2012-10-19 NOTE — Progress Notes (Signed)
Occupational Therapy Discharge Summary  Patient Details  Name: Dale Key MRN: [id number] Date of Birth: [birth year]  Today's Date: 10/19/2012  Patient has met 13 of 13 long term goals due to improved activity tolerance, improved balance, postural control, ability to compensate for deficits, functional use of  RIGHT upper and LEFT upper extremity, improved attention, improved awareness and improved coordination.  Patient to discharge at overall independent to mod I level.  No caregiver or family members have been present. Patient states that he will live with his GF once discharged and she will be able to assist with IADLs prn; she does not currently work.   Reasons goals not met: n/a, all goals met at this time.  Recommendation:  Patient will benefit from ongoing skilled OT services in outpatient setting to continue to advance functional skills in the area of functional use of BUEs, ROM & strengthening to BUEs.  Equipment: shower seat  Reasons for discharge: treatment goals met and discharge from hospital  Patient/family agrees with progress made and goals achieved: Yes  Precautions/Restrictions  Precautions Precautions: Fall;Cervical Restrictions Weight Bearing Restrictions: No  Vital Signs Therapy Vitals Temp: 97.6 F (36.4 C) Temp src: Oral Pulse Rate: 70 Resp: 20 BP: 100/63 mmHg Patient Position, if appropriate: Sitting Oxygen Therapy SpO2: 97 % O2 Device: None (Room air)  Pain Pain Assessment Pain Assessment: No/denies pain Pain Score: 0-No pain  ADL ADL Grooming: Independent Upper Body Bathing: Modified independent Lower Body Bathing: Modified independent Upper Body Dressing: Independent Lower Body Dressing: Modified independent Toileting: Modified independent Toilet Transfer: Modified independent Tub/Shower Transfer: Modified independent Film/video editor: Modified independent  Vision/Perception  Vision - History Baseline Vision: Wears glasses  only for reading Patient Visual Report: No change from baseline Vision - Assessment Eye Alignment: Within Functional Limits Perception Perception: Within Functional Limits Praxis Praxis: Intact   Cognition Overall Cognitive Status: Within Functional Limits for tasks assessed Arousal/Alertness: Awake/alert Orientation Level: Oriented X4 Memory: Appears intact Awareness: Appears intact Problem Solving: Appears intact Safety/Judgment: Appears intact  Sensation Sensation Light Touch: Impaired by gross assessment Stereognosis: Impaired by gross assessment Hot/Cold: Impaired by gross assessment Proprioception: Impaired by gross assessment Additional Comments: Patient reports numbness & tingling is "getting better" and the painful sensation during light touch to arms is "gone away" Coordination Gross Motor Movements are Fluid and Coordinated: Yes Fine Motor Movements are Fluid and Coordinated: Yes  Motor  Motor Motor: Within Functional Limits Motor - Skilled Clinical Observations: weakness B UEs  Trunk/Postural Assessment  Cervical Assessment Cervical Assessment: Exceptions to [facility] (limited due to cervical precautions) Thoracic Assessment Thoracic Assessment: Within Functional Limits Lumbar Assessment Lumbar Assessment: Within Functional Limits Postural Control Postural Control: Within Functional Limits   Balance Balance Balance Assessed: Yes Berg Balance Test Sit to Stand: Able to stand without using hands and stabilize independently Standing Unsupported: Able to stand safely 2 minutes Sitting with Back Unsupported but Feet Supported on Floor or Stool: Able to sit safely and securely 2 minutes Stand to Sit: Sits safely with minimal use of hands Transfers: Able to transfer safely, minor use of hands Standing Unsupported with Eyes Closed: Able to stand 10 seconds safely Standing Ubsupported with Feet Together: Able to place feet together independently and stand 1 minute  safely (pt has wide base of support) From Standing, Reach Forward with Outstretched Arm: Can reach forward >12 cm safely (5") (reaching limited due to UE weakness) From Standing Position, Pick up Object from Floor: Able to pick up shoe, needs supervision (difficulty  due to decreased fine motor control) From Standing Position, Turn to Look Behind Over each Shoulder: Looks behind one side only/other side shows less weight shift Turn 360 Degrees: Able to turn 360 degrees safely but slowly Standing Unsupported, Alternately Place Feet on Step/Stool: Able to complete >2 steps/needs minimal assist (steady A but able to do 8 steps) Standing Unsupported, One Foot in Front: Able to take small step independently and hold 30 seconds Standing on One Leg: Tries to lift leg/unable to hold 3 seconds but remains standing independently Total Score: 43 Static Sitting Balance Static Sitting - Level of Assistance: 7: Independent Dynamic Sitting Balance Dynamic Sitting - Level of Assistance: 7: Independent Static Standing Balance Static Standing - Level of Assistance: 6: Modified independent (Device/Increase time) Dynamic Standing Balance Dynamic Standing - Level of Assistance: 6: Modified independent (Device/Increase time)  Extremity/Trunk Assessment RUE Assessment RUE Assessment: Exceptions to [facility] RUE AROM (degrees) RUE Overall AROM Comments: Shoulder AROM has improved; patient able to flex shoulder ~80* sitting up (180* in supine), Elbow, wrist, & finger AROM is WFL RUE PROM (degrees) RUE Overall PROM Comments: PROM is WFL, tightness noted RUE Strength RUE Overall Strength Comments: decreased shoulder strength, elbow flextion =3/5, elbow extension=5/5, wrist flexion/extension=5/5 LUE Assessment LUE Assessment: Exceptions to WFL LUE AROM (degrees) LUE Overall AROM Comments: Shoulder AROM has improved, patient is able to flex shoulder ~100*, elbow, wrist & finger AROM is WFL LUE PROM (degrees) LUE Overall  PROM Comments: PROM is WFL, some tightness noted; patient with decreased elbow flexion, patient states this was pre-morbid LUE Strength LUE Overall Strength Comments: decreased shoulder strength. elbow flexion=4/5, elbow extension =4/5, wrist flexion/extension=5/5  See FIM for current functional status  Cheyan Frees 10/19/2012, 2:31 PM

## 2012-10-19 NOTE — Progress Notes (Signed)
Physical Therapy Discharge Summary  Patient Details  Name: Dale Key MRN: [id number] Date of Birth: 01/02/1956  Today's Date: 10/19/2012 Time: 1100-1154 Time Calculation (min): 54 min Individual therapy; Session focused on simulated car transfer, bed mobility in ADL apartment, gait in household and simulated community environments, stair negotiation, administered [employer], and floor transfer/what to do in case of fall. Pt overall mod I for all mobility requiring extra time. Pt safe with mobility without AD, but recommend pt to use his SPC in community or for longer distances. Floor transfer: pt able to get onto floor with S but required +2 assist to get back up due to weakness in UE's making it difficult to get into quadruped decision. Reviewed what to do in case of emergency/fall at home and pt verbalized understanding. Pt states he feels ready for d/c and has no further questions.   Patient has met 8 of 8 long term goals due to improved activity tolerance, improved balance, increased strength, increased range of motion, decreased pain, ability to compensate for deficits and functional use of  right upper extremity and left upper extremity.  Patient to discharge at an ambulatory level Modified Independent.   Patient's girlfriend to provide S as needed at d/c.  Reasons goals not met: n/a - all goals met at this time.  Recommendation:  Patient will benefit from ongoing skilled PT services in outpatient setting to continue to advance safe functional mobility, address ongoing impairments in functional use of UE's, overall activity tolerance, muscular endurance, high level balance and gait, and minimize fall risk.  Equipment: No equipment provided. Pt owns SPC to use of community mobility.  Reasons for discharge: treatment goals met and discharge from hospital  Patient/family agrees with progress made and goals achieved: Yes  PT  Discharge Precautions/Restrictions Precautions Precautions: Fall;Cervical Restrictions Weight Bearing Restrictions: No Pain Reports discomfort in his neck but manageable. Vision/Perception  Vision - History Baseline Vision: Wears glasses only for reading Patient Visual Report: No change from baseline Vision - Assessment Eye Alignment: Within Functional Limits Perception Perception: Within Functional Limits Praxis Praxis: Intact  Cognition Overall Cognitive Status: Within Functional Limits for tasks assessed Arousal/Alertness: Awake/alert Orientation Level: Oriented X4 Memory: Appears intact Awareness: Appears intact Problem Solving: Appears intact Safety/Judgment: Appears intact Sensation Sensation Light Touch: Appears Intact (LEs) Stereognosis: Impaired by gross assessment Hot/Cold: Impaired by gross assessment Proprioception: Appears Intact (LEs) Additional Comments: Patient reports numbness & tingling is "getting better" and the painful sensation during light touch to arms is "gone away" Coordination Gross Motor Movements are Fluid and Coordinated: Yes Fine Motor Movements are Fluid and Coordinated: Yes Motor  Motor Motor: Within Functional Limits Motor - Skilled Clinical Observations: weakness B UEs  Locomotion  Ambulation Ambulation/Gait Assistance: 6: Modified independent (Device/Increase time)  Trunk/Postural Assessment  Cervical Assessment Cervical Assessment: Exceptions to [facility] (limited due to cervical precautions) Thoracic Assessment Thoracic Assessment: Within Functional Limits Lumbar Assessment Lumbar Assessment: Within Functional Limits Postural Control Postural Control: Within Functional Limits  Balance Balance Balance Assessed: Yes Berg Balance Test Sit to Stand: Able to stand without using hands and stabilize independently Standing Unsupported: Able to stand safely 2 minutes Sitting with Back Unsupported but Feet Supported on Floor or Stool:  Able to sit safely and securely 2 minutes Stand to Sit: Sits safely with minimal use of hands Transfers: Able to transfer safely, minor use of hands Standing Unsupported with Eyes Closed: Able to stand 10 seconds safely Standing Ubsupported with Feet Together: Able to place feet  together independently and stand 1 minute safely (pt has wide base of support) From Standing, Reach Forward with Outstretched Arm: Can reach forward >12 cm safely (5") (reaching limited due to UE weakness) From Standing Position, Pick up Object from Floor: Able to pick up shoe, needs supervision (difficulty due to decreased fine motor control) From Standing Position, Turn to Look Behind Over each Shoulder: Looks behind one side only/other side shows less weight shift Turn 360 Degrees: Able to turn 360 degrees safely but slowly Standing Unsupported, Alternately Place Feet on Step/Stool: Able to complete >2 steps/needs minimal assist (steady A but able to do 8 steps) Standing Unsupported, One Key in Front: Able to take small step independently and hold 30 seconds Standing on One Leg: Tries to lift leg/unable to hold 3 seconds but remains standing independently Total Score: 43 Static Sitting Balance Static Sitting - Level of Assistance: 7: Independent Dynamic Sitting Balance Dynamic Sitting - Level of Assistance: 7: Independent Static Standing Balance Static Standing - Level of Assistance: 6: Modified independent (Device/Increase time) Dynamic Standing Balance Dynamic Standing - Level of Assistance: 6: Modified independent (Device/Increase time) Extremity Assessment      RLE Assessment RLE Assessment: Within Functional Limits (grossly 4+/5) LLE Assessment LLE Assessment: Within Functional Limits (grossly 4+/5)  See FIM for current functional status  Karolee Stamps [facility] 10/19/2012, 11:30 AM

## 2012-10-19 NOTE — Discharge Summary (Signed)
Discharge summary job [id number]

## 2012-10-20 LAB — GLUCOSE, CAPILLARY
Glucose-Capillary: 274 mg/dL — ABNORMAL HIGH (ref 70–99)
Glucose-Capillary: 315 mg/dL — ABNORMAL HIGH (ref 70–99)

## 2012-10-20 MED ORDER — DEXAMETHASONE 2 MG PO TABS: 2.0000 mg | ORAL_TABLET | Freq: Two times a day (BID) | ORAL | Status: DC

## 2012-10-20 MED ORDER — LOSARTAN POTASSIUM 100 MG PO TABS: 100.0000 mg | ORAL_TABLET | Freq: Every morning | ORAL | Status: DC

## 2012-10-20 MED ORDER — PANTOPRAZOLE SODIUM 40 MG PO TBEC: 40.0000 mg | DELAYED_RELEASE_TABLET | Freq: Every day | ORAL | Status: DC

## 2012-10-20 MED ORDER — BRINZOLAMIDE 1 % OP SUSP: 1.0000 [drp] | Freq: Three times a day (TID) | OPHTHALMIC | Status: DC

## 2012-10-20 MED ORDER — METFORMIN HCL 1000 MG PO TABS: 1000.0000 mg | ORAL_TABLET | Freq: Two times a day (BID) | ORAL | Status: DC

## 2012-10-20 MED ORDER — OXYCODONE-ACETAMINOPHEN 5-325 MG PO TABS: 1.0000 | ORAL_TABLET | ORAL | Status: DC | PRN

## 2012-10-20 MED ORDER — POTASSIUM CHLORIDE CRYS ER 20 MEQ PO TBCR: 20.0000 meq | EXTENDED_RELEASE_TABLET | Freq: Every day | ORAL | Status: AC | PRN

## 2012-10-20 MED ORDER — FUROSEMIDE 40 MG PO TABS: 40.0000 mg | ORAL_TABLET | Freq: Every day | ORAL | Status: DC

## 2012-10-20 MED ORDER — LATANOPROST 0.005 % OP SOLN: 1.0000 [drp] | Freq: Every day | OPHTHALMIC | Status: DC

## 2012-10-20 MED ORDER — METOPROLOL SUCCINATE ER 50 MG PO TB24: 50.0000 mg | ORAL_TABLET | Freq: Every morning | ORAL | Status: AC

## 2012-10-20 MED ORDER — METHOCARBAMOL 500 MG PO TABS: 500.0000 mg | ORAL_TABLET | Freq: Four times a day (QID) | ORAL | Status: DC | PRN

## 2012-10-20 MED ORDER — DEXAMETHASONE 2 MG PO TABS
2.0000 mg | ORAL_TABLET | Freq: Two times a day (BID) | ORAL | Status: DC
  Administered 2012-10-20: 2 mg via ORAL
  Filled 2012-10-20 (×3): qty 1

## 2012-10-20 MED ORDER — TRAVOPROST 0.004 % OP SOLN: 1.0000 [drp] | Freq: Every day | OPHTHALMIC | Status: DC

## 2012-10-20 NOTE — Progress Notes (Signed)
Subjective/Complaints: Sitting eob. Has no complaints.  A 12 point review of systems has been performed and if not noted above is otherwise negative.   Objective: Vital Signs: Blood pressure 110/71, pulse 72, temperature 98.1 F (36.7 C), temperature source Oral, resp. rate 19, height 5\' 9"  (1.753 m), weight 130.182 kg (287 lb), SpO2 97.00%. No results found. No results found for this basename: WBC, HGB, HCT, PLT,  in the last 72 hours No results found for this basename: NA, K, CL, CO, GLUCOSE, BUN, CREATININE, CALCIUM,  in the last 72 hours CBG (last 3)   Recent Labs  10/19/12 1635 10/19/12 2101 10/20/12 0720  GLUCAP 164* 239* 274*    Wt Readings from Last 3 Encounters:  10/13/12 130.182 kg (287 lb)  10/08/12 138.1 kg (304 lb 7.3 oz)  10/08/12 138.1 kg (304 lb 7.3 oz)    Physical Exam:  Blood pressure 113/64, pulse 61, temperature 98.5 F (36.9 C), temperature source Oral, resp. rate 15, height 5\' 9"  (1.753 m), weight 138.1 kg (304 lb 7.3 oz), SpO2 93.00%.  Constitutional: He is oriented to person, place, and time.  HENT: perrl  Head: Normocephalic.  Eyes: EOM are normal.  Neck: No thyromegaly present.  Cardiovascular: Normal rate and regular rhythm. No murmur or gallops  Pulmonary/Chest: Effort normal and breath sounds normal. No respiratory distress. No wheezes  Abdominal: Soft. Bowel sounds are normal. He exhibits no distension.  Obese  Neurological: He is alert and oriented to person, place, and time. Normal sensation over the clavicle.   some loss of FT over the distal UE's. Decreased FMC of hands.    RUE 2/5 shoulder, 2+ to 3/5 distally. LUE is 3+ deltoid, bicep, tricep, hand. RLE is 3 prox at HF, 4-KE, 3+ to 4 ankle. LLE is her/5 in the hip flexor knee extensor ankle dorsiflexor. DTR's 1+. Seems distracted, fair insight and awareness  Musc: shoulder ROM limited in ABD,Flex,ext, head forward posture, mild pain with palpation around C7 Skin:  Surgical site clean and  dry. onychomyosis of toe nails. Psychiatric: He has a normal mood and affect.      Assessment/Plan: 1. Functional deficits secondary to C3-5 cord contsuion which require 3+ hours per day of interdisciplinary therapy in a comprehensive inpatient rehab setting. Physiatrist is providing close team supervision and 24 hour management of active medical problems listed below. Physiatrist and rehab team continue to assess barriers to discharge/monitor patient progress toward functional and medical goals. FIM: FIM - Bathing Bathing Steps Patient Completed: Chest;Right Arm;Left Arm;Abdomen;Front perineal area;Right upper leg;Buttocks;Left upper leg;Right lower leg (including foot);Left lower leg (including foot) Bathing: 6: Assistive device (Comment)  FIM - Upper Body Dressing/Undressing Upper body dressing/undressing steps patient completed: Thread/unthread right sleeve of pullover shirt/dresss;Thread/unthread left sleeve of pullover shirt/dress;Pull shirt over trunk;Put head through opening of pull over shirt/dress Upper body dressing/undressing: 7: Complete Independence: No helper FIM - Lower Body Dressing/Undressing Lower body dressing/undressing steps patient completed: Thread/unthread right pants leg;Thread/unthread left pants leg;Pull pants up/down;Don/Doff right sock;Don/Doff left sock Lower body dressing/undressing: 6: Assistive device (Comment)  FIM - Toileting Toileting steps completed by patient: Adjust clothing prior to toileting;Performs perineal hygiene;Adjust clothing after toileting Toileting: 6: Assistive device: No helper  FIM - Diplomatic Services operational officer Devices: Elevated toilet seat;Grab bars Toilet Transfers: 6-Assistive device: No helper  FIM - Banker Devices: Arm rests Bed/Chair Transfer: 6: More than reasonable amt of time  FIM - Locomotion: Wheelchair Locomotion: Wheelchair: 0: Activity did not occur (gait  primary means) FIM - Locomotion: Ambulation Locomotion: Ambulation Assistive Devices: Emergency planning/management officer Ambulation/Gait Assistance: 6: Modified independent (Device/Increase time) Locomotion: Ambulation: 6: Travels 150 ft or more independently/takes more than reasonable amount of time  Comprehension Comprehension Mode: Auditory Comprehension: 6-Follows complex conversation/direction: With extra time/assistive device  Expression Expression Mode: Verbal Expression: 6-Expresses complex ideas: With extra time/assistive device  Social Interaction Social Interaction: 6-Interacts appropriately with others with medication or extra time (anti-anxiety, antidepressant).  Problem Solving Problem Solving: 6-Solves complex problems: With extra time  Memory Memory: 6-Assistive device: No helper  Medical Problem List and Plan:  1. Cervical stenosis with myelopathy/central cord injury. Status post C3, 4-5 decompression and fusion  2. DVT Prophylaxis/Anticoagulation: SCDs. Patient is ambulatory.  3. Pain Management: Percocet, Robaxin as needed. Monitor with increased mobility  4. Neuropsych: This patient is capable of making decisions on his own behalf.  5. Diabetes mellitus with peripheral neuropathy.   Monitor while on Decadron taper. Continue Glucophage 1000 mg twice a day   -weaning steroids/sugars falling into the 100's--reviewed with the patient today  -not anxious to add another agent given his hx of non-compliance 6. Hypertension. Lasix 40 mg daily, Cozaar 100 mg daily, Toprol-XL 50 mg daily. Monitor with increased mobility  7. Question chronic dysphasia. Followup speech therapy. Advance diet as tolerated. Monitor for any signs of aspiration  8. Urology: follow up with Dr. Brunilda Payor as an outpt for PSA 9. Onychomycosis: filed left 4th toe nail down to smoother consistency LOS (Days) 7 A FACE TO FACE EVALUATION WAS PERFORMED  Dale Key T 10/20/2012 8:05 AM

## 2012-10-20 NOTE — Progress Notes (Signed)
Speech Language Pathology Discharge Summary  Patient Details  Name: Dale Key MRN: [id number] Date of Birth: 05/16/1955  Today's Date: 10/20/2012  Patient has met 2 of 2 long term goals.  Patient to discharge at overall Modified Independent level.   Reasons goals not met: N/A   Clinical Impression/Discharge Summary: Pt has made functional gains and has met 2 of 2 LTG's this reporting period. Currently, pt is consuming regular textures with thin liquids and is Mod I for utilization of swallowing compensatory strategies. Pt intermittently demonstrates a wet vocal quality with and without meals but can independently self-monitor and correct. Pt has not shown any overt s/s of aspiration throughout meals and pt's overall temperature, lung sounds and O2 saturations have stayed [facility], therefore, no SLP f/u for dysphagia is warranted at this time. The pt has requested f/u outpatient services with focus on vocal function. 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 The pt was provided a handout and education in regards to vocal hygiene.  Pt education complete and pt will discharge home with assistance from friends.    Recommendation:  Outpatient SLP (with focus on vocal function per pt request)  Rationale for SLP Follow Up:  (to maximize vocal function )   Equipment: N/A   Reasons for discharge: Treatment goals met;Discharged from hospital   Patient/Family Agrees with Progress Made and Goals Achieved: Yes   See FIM for current functional status  [name] 10/20/2012, 7:41 AM

## 2012-10-20 NOTE — Progress Notes (Signed)
Pt discharged home with significant other/friend. Discharge instruction provided by Harvel Ricks, MD. All questions answered. Pt escorted off unit with personal belonging by Lelon Mast, NT

## 2012-10-20 NOTE — Progress Notes (Signed)
Social Work  Discharge Note  The overall goal for the admission was met for:   Discharge location: Yes - home with prn assist   Length of Stay: Yes - 7 days  Discharge activity level: Yes - modified independent  Home/community participation: Yes  Services provided included: MD, RD, PT, OT, SLP, RN, Pharmacy and SW  Financial Services: Private Insurance: [employer]  Follow-up services arranged: Outpatient: PT, OT, ST via [facility], DME: tub seat via [facility] and Patient/Family has no preference for HH/DME agencies  Comments (or additional information):  Patient/Family verbalized understanding of follow-up arrangements: Yes  Individual responsible for coordination of the follow-up plan: patient  Confirmed correct DME delivered: Jillyn Stacey 10/20/2012    Margaretann Abate

## 2012-10-27 ENCOUNTER — Ambulatory Visit: Payer: BC Managed Care – PPO | Attending: Physical Medicine & Rehabilitation | Admitting: Physical Therapy

## 2012-10-27 ENCOUNTER — Ambulatory Visit: Payer: BC Managed Care – PPO | Admitting: Occupational Therapy

## 2012-10-27 ENCOUNTER — Ambulatory Visit: Payer: BC Managed Care – PPO | Admitting: Speech Pathology

## 2012-10-27 DIAGNOSIS — Z5189 Encounter for other specified aftercare: Secondary | ICD-10-CM | POA: Insufficient documentation

## 2012-10-27 DIAGNOSIS — R279 Unspecified lack of coordination: Secondary | ICD-10-CM | POA: Insufficient documentation

## 2012-10-27 DIAGNOSIS — R269 Unspecified abnormalities of gait and mobility: Secondary | ICD-10-CM | POA: Insufficient documentation

## 2012-10-27 DIAGNOSIS — M6281 Muscle weakness (generalized): Secondary | ICD-10-CM | POA: Insufficient documentation

## 2012-10-27 DIAGNOSIS — R490 Dysphonia: Secondary | ICD-10-CM | POA: Insufficient documentation

## 2012-11-01 ENCOUNTER — Ambulatory Visit: Payer: BC Managed Care – PPO | Admitting: Physical Therapy

## 2012-11-01 ENCOUNTER — Ambulatory Visit: Payer: BC Managed Care – PPO | Admitting: Occupational Therapy

## 2012-11-01 ENCOUNTER — Ambulatory Visit: Payer: BC Managed Care – PPO

## 2012-11-03 ENCOUNTER — Encounter: Payer: BC Managed Care – PPO | Admitting: Speech Pathology

## 2012-11-03 ENCOUNTER — Ambulatory Visit: Payer: BC Managed Care – PPO | Admitting: Physical Therapy

## 2012-11-03 ENCOUNTER — Ambulatory Visit: Payer: BC Managed Care – PPO | Admitting: Occupational Therapy

## 2012-11-03 ENCOUNTER — Encounter: Payer: BC Managed Care – PPO | Admitting: Occupational Therapy

## 2012-11-08 ENCOUNTER — Ambulatory Visit: Payer: BC Managed Care – PPO | Admitting: Physical Therapy

## 2012-11-08 ENCOUNTER — Ambulatory Visit: Payer: BC Managed Care – PPO | Admitting: Speech Pathology

## 2012-11-08 ENCOUNTER — Ambulatory Visit: Payer: BC Managed Care – PPO | Admitting: Occupational Therapy

## 2012-11-10 ENCOUNTER — Ambulatory Visit: Payer: BC Managed Care – PPO | Admitting: Rehabilitative and Restorative Service Providers"

## 2012-11-10 ENCOUNTER — Encounter: Payer: BC Managed Care – PPO | Admitting: Speech Pathology

## 2012-11-10 ENCOUNTER — Ambulatory Visit: Payer: BC Managed Care – PPO | Admitting: Occupational Therapy

## 2012-11-14 ENCOUNTER — Ambulatory Visit: Payer: BC Managed Care – PPO | Admitting: Physical Therapy

## 2012-11-14 ENCOUNTER — Ambulatory Visit: Payer: BC Managed Care – PPO

## 2012-11-14 ENCOUNTER — Ambulatory Visit: Payer: BC Managed Care – PPO | Attending: Physical Medicine & Rehabilitation | Admitting: Occupational Therapy

## 2012-11-14 DIAGNOSIS — R490 Dysphonia: Secondary | ICD-10-CM | POA: Insufficient documentation

## 2012-11-14 DIAGNOSIS — R279 Unspecified lack of coordination: Secondary | ICD-10-CM | POA: Insufficient documentation

## 2012-11-14 DIAGNOSIS — Z5189 Encounter for other specified aftercare: Secondary | ICD-10-CM | POA: Insufficient documentation

## 2012-11-14 DIAGNOSIS — M6281 Muscle weakness (generalized): Secondary | ICD-10-CM | POA: Insufficient documentation

## 2012-11-14 DIAGNOSIS — R269 Unspecified abnormalities of gait and mobility: Secondary | ICD-10-CM | POA: Insufficient documentation

## 2012-11-16 ENCOUNTER — Ambulatory Visit: Payer: BC Managed Care – PPO | Admitting: Occupational Therapy

## 2012-11-16 ENCOUNTER — Ambulatory Visit: Payer: BC Managed Care – PPO | Admitting: Physical Therapy

## 2012-11-21 ENCOUNTER — Ambulatory Visit: Payer: BC Managed Care – PPO | Admitting: Physical Therapy

## 2012-11-21 ENCOUNTER — Ambulatory Visit: Payer: BC Managed Care – PPO | Admitting: Occupational Therapy

## 2012-11-23 ENCOUNTER — Ambulatory Visit: Payer: BC Managed Care – PPO | Admitting: Occupational Therapy

## 2012-11-23 ENCOUNTER — Ambulatory Visit: Payer: BC Managed Care – PPO | Admitting: Physical Therapy

## 2012-11-28 ENCOUNTER — Ambulatory Visit: Payer: BC Managed Care – PPO | Admitting: Physical Therapy

## 2012-11-28 ENCOUNTER — Ambulatory Visit: Payer: BC Managed Care – PPO | Admitting: Occupational Therapy

## 2012-11-30 ENCOUNTER — Ambulatory Visit: Payer: BC Managed Care – PPO | Admitting: Physical Therapy

## 2012-11-30 ENCOUNTER — Encounter: Payer: BC Managed Care – PPO | Admitting: Occupational Therapy

## 2012-12-05 ENCOUNTER — Ambulatory Visit: Payer: BC Managed Care – PPO | Admitting: Physical Therapy

## 2012-12-05 ENCOUNTER — Encounter: Payer: BC Managed Care – PPO | Admitting: Occupational Therapy

## 2012-12-07 ENCOUNTER — Encounter: Payer: BC Managed Care – PPO | Admitting: Occupational Therapy

## 2012-12-07 ENCOUNTER — Ambulatory Visit: Payer: BC Managed Care – PPO | Admitting: Physical Therapy

## 2012-12-12 ENCOUNTER — Encounter: Payer: BC Managed Care – PPO | Admitting: Occupational Therapy

## 2012-12-12 ENCOUNTER — Ambulatory Visit: Payer: 59 | Attending: Physical Medicine & Rehabilitation | Admitting: Physical Therapy

## 2012-12-12 DIAGNOSIS — R269 Unspecified abnormalities of gait and mobility: Secondary | ICD-10-CM | POA: Insufficient documentation

## 2012-12-12 DIAGNOSIS — Z5189 Encounter for other specified aftercare: Secondary | ICD-10-CM | POA: Insufficient documentation

## 2012-12-12 DIAGNOSIS — M6281 Muscle weakness (generalized): Secondary | ICD-10-CM | POA: Insufficient documentation

## 2012-12-12 DIAGNOSIS — R279 Unspecified lack of coordination: Secondary | ICD-10-CM | POA: Insufficient documentation

## 2012-12-12 DIAGNOSIS — R490 Dysphonia: Secondary | ICD-10-CM | POA: Insufficient documentation

## 2012-12-14 ENCOUNTER — Ambulatory Visit: Payer: 59 | Admitting: Physical Therapy

## 2012-12-14 ENCOUNTER — Encounter: Payer: BC Managed Care – PPO | Admitting: Occupational Therapy

## 2012-12-20 ENCOUNTER — Ambulatory Visit: Payer: 59 | Admitting: Physical Therapy

## 2012-12-20 ENCOUNTER — Ambulatory Visit: Payer: 59 | Admitting: Occupational Therapy

## 2012-12-22 ENCOUNTER — Ambulatory Visit: Payer: BC Managed Care – PPO | Admitting: Physical Therapy

## 2012-12-22 ENCOUNTER — Encounter: Payer: BC Managed Care – PPO | Admitting: Occupational Therapy

## 2012-12-28 ENCOUNTER — Ambulatory Visit: Payer: BC Managed Care – PPO | Admitting: Physical Therapy

## 2012-12-28 ENCOUNTER — Encounter: Payer: BC Managed Care – PPO | Admitting: Occupational Therapy

## 2013-01-19 ENCOUNTER — Ambulatory Visit: Payer: 59 | Attending: Physical Medicine & Rehabilitation | Admitting: Physical Therapy

## 2013-01-19 ENCOUNTER — Ambulatory Visit: Payer: 59 | Admitting: Occupational Therapy

## 2013-01-19 DIAGNOSIS — R279 Unspecified lack of coordination: Secondary | ICD-10-CM | POA: Insufficient documentation

## 2013-01-19 DIAGNOSIS — R490 Dysphonia: Secondary | ICD-10-CM | POA: Insufficient documentation

## 2013-01-19 DIAGNOSIS — R269 Unspecified abnormalities of gait and mobility: Secondary | ICD-10-CM | POA: Insufficient documentation

## 2013-01-19 DIAGNOSIS — M6281 Muscle weakness (generalized): Secondary | ICD-10-CM | POA: Insufficient documentation

## 2013-01-19 DIAGNOSIS — Z5189 Encounter for other specified aftercare: Secondary | ICD-10-CM | POA: Insufficient documentation

## 2013-01-26 ENCOUNTER — Ambulatory Visit: Payer: 59 | Admitting: Occupational Therapy

## 2013-01-26 ENCOUNTER — Ambulatory Visit: Payer: 59 | Admitting: Physical Therapy

## 2013-01-30 ENCOUNTER — Ambulatory Visit: Payer: 59 | Admitting: Occupational Therapy

## 2013-01-30 ENCOUNTER — Ambulatory Visit: Payer: 59 | Admitting: Physical Therapy

## 2013-02-07 ENCOUNTER — Ambulatory Visit: Payer: 59 | Admitting: Physical Therapy

## 2013-02-07 ENCOUNTER — Ambulatory Visit: Payer: 59 | Admitting: Occupational Therapy

## 2013-02-08 ENCOUNTER — Ambulatory Visit: Payer: 59 | Admitting: Occupational Therapy

## 2013-02-08 ENCOUNTER — Ambulatory Visit: Payer: 59 | Admitting: Physical Therapy

## 2013-02-14 ENCOUNTER — Ambulatory Visit: Payer: 59 | Admitting: Occupational Therapy

## 2013-02-14 ENCOUNTER — Ambulatory Visit: Payer: 59 | Attending: Physical Medicine & Rehabilitation | Admitting: Physical Therapy

## 2013-02-14 DIAGNOSIS — R269 Unspecified abnormalities of gait and mobility: Secondary | ICD-10-CM | POA: Insufficient documentation

## 2013-02-14 DIAGNOSIS — Z5189 Encounter for other specified aftercare: Secondary | ICD-10-CM | POA: Insufficient documentation

## 2013-02-14 DIAGNOSIS — M6281 Muscle weakness (generalized): Secondary | ICD-10-CM | POA: Insufficient documentation

## 2013-02-14 DIAGNOSIS — R279 Unspecified lack of coordination: Secondary | ICD-10-CM | POA: Insufficient documentation

## 2013-02-14 DIAGNOSIS — R490 Dysphonia: Secondary | ICD-10-CM | POA: Insufficient documentation

## 2013-02-17 ENCOUNTER — Ambulatory Visit: Payer: 59 | Admitting: Occupational Therapy

## 2013-02-17 ENCOUNTER — Ambulatory Visit: Payer: 59 | Admitting: Physical Therapy

## 2013-02-21 ENCOUNTER — Ambulatory Visit: Payer: 59 | Admitting: Physical Therapy

## 2013-02-21 ENCOUNTER — Ambulatory Visit: Payer: 59 | Admitting: Occupational Therapy

## 2013-02-24 ENCOUNTER — Ambulatory Visit: Payer: 59 | Admitting: Physical Therapy

## 2013-02-24 ENCOUNTER — Ambulatory Visit: Payer: 59 | Admitting: Occupational Therapy

## 2013-02-28 ENCOUNTER — Ambulatory Visit: Payer: 59 | Admitting: *Deleted

## 2013-02-28 ENCOUNTER — Ambulatory Visit: Payer: 59 | Admitting: Physical Therapy

## 2013-03-01 ENCOUNTER — Emergency Department (HOSPITAL_COMMUNITY): Payer: 59

## 2013-03-01 ENCOUNTER — Emergency Department (HOSPITAL_COMMUNITY)
Admission: EM | Admit: 2013-03-01 | Discharge: 2013-03-01 | Disposition: A | Discharge status: Home / Self Care | Payer: 59 | Attending: Emergency Medicine | Admitting: Emergency Medicine

## 2013-03-01 ENCOUNTER — Encounter (HOSPITAL_COMMUNITY): Payer: Self-pay | Admitting: Emergency Medicine

## 2013-03-01 DIAGNOSIS — W010XXA Fall on same level from slipping, tripping and stumbling without subsequent striking against object, initial encounter: Secondary | ICD-10-CM | POA: Insufficient documentation

## 2013-03-01 DIAGNOSIS — H1130 Conjunctival hemorrhage, unspecified eye: Secondary | ICD-10-CM | POA: Insufficient documentation

## 2013-03-01 DIAGNOSIS — W19XXXA Unspecified fall, initial encounter: Secondary | ICD-10-CM

## 2013-03-01 DIAGNOSIS — S0990XA Unspecified injury of head, initial encounter: Secondary | ICD-10-CM | POA: Insufficient documentation

## 2013-03-01 DIAGNOSIS — Z87891 Personal history of nicotine dependence: Secondary | ICD-10-CM | POA: Insufficient documentation

## 2013-03-01 DIAGNOSIS — S0010XA Contusion of unspecified eyelid and periocular area, initial encounter: Secondary | ICD-10-CM | POA: Insufficient documentation

## 2013-03-01 DIAGNOSIS — E119 Type 2 diabetes mellitus without complications: Secondary | ICD-10-CM | POA: Insufficient documentation

## 2013-03-01 DIAGNOSIS — Z79899 Other long term (current) drug therapy: Secondary | ICD-10-CM | POA: Insufficient documentation

## 2013-03-01 DIAGNOSIS — W1809XA Striking against other object with subsequent fall, initial encounter: Secondary | ICD-10-CM | POA: Insufficient documentation

## 2013-03-01 DIAGNOSIS — Y929 Unspecified place or not applicable: Secondary | ICD-10-CM | POA: Insufficient documentation

## 2013-03-01 DIAGNOSIS — I10 Essential (primary) hypertension: Secondary | ICD-10-CM | POA: Insufficient documentation

## 2013-03-01 DIAGNOSIS — Y939 Activity, unspecified: Secondary | ICD-10-CM | POA: Insufficient documentation

## 2013-03-01 NOTE — ED Notes (Signed)
Pt requested to be repositioned and to sit in the chair for comfort. Call bell within reach

## 2013-03-01 NOTE — ED Provider Notes (Signed)
Medical screening examination/treatment/procedure(s) were performed by non-physician practitioner and as supervising physician I was immediately available for consultation/collaboration.  Dale MelterElliott L Chrissi Crow, MD 03/01/13 [phone]

## 2013-03-01 NOTE — Discharge Instructions (Signed)
Refer to attached documents for more information. Return to the ED with worsening or concerning symptoms.  °

## 2013-03-01 NOTE — ED Notes (Signed)
Pt ambulatory to the restroom with steady gait.

## 2013-03-01 NOTE — ED Notes (Signed)
Transported to radiology 

## 2013-03-01 NOTE — ED Provider Notes (Signed)
CSN: [id number]     Arrival date & time 03/01/13  1734 History   First MD Initiated Contact with Patient 03/01/13 1749     Chief Complaint  Patient presents with  . Eye Injury     (Consider location/radiation/quality/duration/timing/severity/associated sxs/prior Treatment) HPI Comments: Patient is a 58 year old male who presents to the ED after a mechanical fall that occurred earlier today. Patient reports slipping on icy step around 3pm today and he hit his face on the cement. Patient reports a moderately severe, dull headache that started after the fall. He reports right eyeball swelling. No aggravating/alleviating factors. Patient denies any other injury. He denies LOC and he does not take blood thinners. Patient went to his Ophthalmologist who evaluated him and then sent him to the ED for imaging of head and face.    Past Medical History  Diagnosis Date  . Hypertension   . Diabetes mellitus without complication    Past Surgical History  Procedure Laterality Date  . Eye surgery    . Anterior cervical decomp/discectomy fusion N/A 10/07/2012    Procedure: ANTERIOR CERVICAL DECOMPRESSION/DISCECTOMY FUSION 2 LEVELS;  Surgeon: Karn Cassis, MD;  Location: [facility] OR;  Service: Neurosurgery;  Laterality: N/A;  Anterior Cervical Three-Four/Four-Five Decompression and Fusion   No family history on file. History  Substance Use Topics  . Smoking status: Former Smoker    Quit date: 01/13/2012  . Smokeless tobacco: Not on file  . Alcohol Use: Yes    Review of Systems  Constitutional: Negative for fever, chills and fatigue.  HENT: Negative for trouble swallowing.   Eyes: Positive for redness. Negative for visual disturbance.  Respiratory: Negative for shortness of breath.   Cardiovascular: Negative for chest pain and palpitations.  Gastrointestinal: Negative for nausea, vomiting, abdominal pain and diarrhea.  Genitourinary: Negative for dysuria and difficulty urinating.  Musculoskeletal:  Negative for arthralgias and neck pain.  Skin: Negative for color change.  Neurological: Positive for headaches. Negative for dizziness and weakness.  Psychiatric/Behavioral: Negative for dysphoric mood.      Allergies  Review of patient's allergies indicates no known allergies.  Home Medications   Current Outpatient Rx  Name  Route  Sig  Dispense  Refill  . furosemide (LASIX) 40 MG tablet   Oral   Take 1 tablet (40 mg total) by mouth daily.   30 tablet   1   . ibuprofen (ADVIL,MOTRIN) 200 MG tablet   Oral   Take 400 mg by mouth every 6 (six) hours as needed (pain).         Marland Kitchen losartan (COZAAR) 100 MG tablet   Oral   Take 500 mg by mouth every morning. Take 1/2 tablet         . metFORMIN (GLUCOPHAGE) 1000 MG tablet   Oral   Take 1 tablet (1,000 mg total) by mouth 2 (two) times daily with a meal.   60 tablet   1   . metoprolol succinate (TOPROL-XL) 50 MG 24 hr tablet   Oral   Take 1 tablet (50 mg total) by mouth every morning. Take with or immediately following a meal.   30 tablet   1   . Multiple Vitamin (MULTIVITAMIN WITH MINERALS) TABS tablet   Oral   Take 1 tablet by mouth every morning.         . potassium chloride SA (K-DUR,KLOR-CON) 20 MEQ tablet   Oral   Take 1 tablet (20 mEq total) by mouth daily as needed (For potassium  loss.).   30 tablet   0    BP 128/69  Pulse 80  Temp(Src) 97.6 F (36.4 C) (Oral)  Resp 16  SpO2 95% Physical Exam  Nursing note and vitals reviewed. Constitutional: He is oriented to person, place, and time. He appears well-developed and well-nourished. No distress.  HENT:  Head: Normocephalic and atraumatic.  Eyes: EOM are normal.  Patient's pupils dilated from previous ophthalmologic appointment. Patient has moderate chemosis from subconjunctival hemorrhage of right eye. Left eye unremarkable. EOM intact and patient reports pain with left lateral deviation of right eye. Right eyebrow tenderness to palpation without  edema.   Neck: Normal range of motion.  Cardiovascular: Normal rate and regular rhythm.  Exam reveals no gallop and no friction rub.   No murmur heard. Pulmonary/Chest: Effort normal and breath sounds normal. He has no wheezes. He has no rales. He exhibits no tenderness.  Abdominal: Soft. He exhibits no distension. There is no tenderness. There is no rebound.  Musculoskeletal: Normal range of motion.  Neurological: He is alert and oriented to person, place, and time. Coordination normal.  Speech is goal-oriented. Moves limbs without ataxia.   Skin: Skin is warm and dry.  Psychiatric: He has a normal mood and affect. His behavior is normal.    ED Course  Procedures (including critical care time) Labs Review Labs Reviewed - No data to display Imaging Review No results found.  EKG Interpretation   None       MDM   Final diagnoses:  Fall  Subconjunctival hemorrhage, traumatic    7:38 PM CT head, face and cervical spine pending. Patient denies any other injury. Patient declines pain medication.   8:24 PM Patient's imaging is unremarkable for acute changes or injury. Vitals stable and patient afebrile. Patient has no neuro deficits. Patient will be discharged with instructions to return with worsening or concerning symptoms.    Emilia BeckKaitlyn Lillyian Heidt, [place] JerseyPA-C 03/01/13 2027

## 2013-03-01 NOTE — ED Notes (Addendum)
[facility] Pt was seen there after a fall in which he fell off his steps that were covered in ice. Pt sent with referral note that reads "right orbital trauma/ frontal head trauma with subconjunctival hemorrhage with edema on the right eye, (hx. Fall on ice 3 pm 03/01/13). States pt will need CT brain/orbits w/o contrast to rule out orbital fracture or intracranial hemorrhage. - Dr. Gearldine ShownJ. Suzanne Parker, O.D." Pt is A/O x4. Pt reports last tetanus vaccine in September of 2014, which he states was administered here.

## 2013-03-02 ENCOUNTER — Ambulatory Visit: Payer: 59 | Admitting: Physical Therapy

## 2013-03-02 ENCOUNTER — Ambulatory Visit: Payer: 59 | Admitting: Occupational Therapy

## 2013-03-09 ENCOUNTER — Ambulatory Visit: Payer: 59 | Admitting: Physical Therapy

## 2013-03-09 ENCOUNTER — Encounter: Payer: 59 | Admitting: Occupational Therapy

## 2013-03-13 ENCOUNTER — Ambulatory Visit: Payer: 59 | Attending: Physical Medicine & Rehabilitation | Admitting: Physical Therapy

## 2013-03-13 ENCOUNTER — Ambulatory Visit: Payer: 59 | Admitting: *Deleted

## 2013-03-13 DIAGNOSIS — Z5189 Encounter for other specified aftercare: Secondary | ICD-10-CM | POA: Insufficient documentation

## 2013-03-13 DIAGNOSIS — R269 Unspecified abnormalities of gait and mobility: Secondary | ICD-10-CM | POA: Insufficient documentation

## 2013-03-13 DIAGNOSIS — R279 Unspecified lack of coordination: Secondary | ICD-10-CM | POA: Insufficient documentation

## 2013-03-13 DIAGNOSIS — M6281 Muscle weakness (generalized): Secondary | ICD-10-CM | POA: Insufficient documentation

## 2013-03-13 DIAGNOSIS — R490 Dysphonia: Secondary | ICD-10-CM | POA: Insufficient documentation

## 2013-03-16 ENCOUNTER — Telehealth: Payer: Self-pay

## 2013-03-16 NOTE — Telephone Encounter (Signed)
Letter faxed to patients daughters insurance per there request.

## 2013-04-04 ENCOUNTER — Other Ambulatory Visit (HOSPITAL_COMMUNITY): Payer: Self-pay | Admitting: Pulmonary Disease

## 2013-04-04 DIAGNOSIS — R1310 Dysphagia, unspecified: Secondary | ICD-10-CM

## 2013-04-06 ENCOUNTER — Ambulatory Visit (HOSPITAL_COMMUNITY)
Admission: RE | Admit: 2013-04-06 | Discharge: 2013-04-06 | Disposition: A | Discharge status: Home / Self Care | Payer: 59 | Source: Ambulatory Visit | Attending: Pulmonary Disease | Admitting: Pulmonary Disease

## 2013-04-06 ENCOUNTER — Other Ambulatory Visit (HOSPITAL_COMMUNITY): Payer: Self-pay | Admitting: Pulmonary Disease

## 2013-04-06 DIAGNOSIS — R1310 Dysphagia, unspecified: Secondary | ICD-10-CM

## 2013-04-06 DIAGNOSIS — R0602 Shortness of breath: Secondary | ICD-10-CM | POA: Insufficient documentation

## 2013-05-03 ENCOUNTER — Other Ambulatory Visit (INDEPENDENT_AMBULATORY_CARE_PROVIDER_SITE_OTHER): Payer: Self-pay | Admitting: Otolaryngology

## 2013-05-03 DIAGNOSIS — R1310 Dysphagia, unspecified: Secondary | ICD-10-CM

## 2013-05-11 ENCOUNTER — Ambulatory Visit
Admission: RE | Admit: 2013-05-11 | Discharge: 2013-05-11 | Disposition: A | Discharge status: Home / Self Care | Payer: No Typology Code available for payment source | Source: Ambulatory Visit | Attending: Otolaryngology | Admitting: Otolaryngology

## 2013-05-11 DIAGNOSIS — R1310 Dysphagia, unspecified: Secondary | ICD-10-CM

## 2013-05-11 MED ORDER — IOHEXOL 300 MG/ML  SOLN
75.0000 mL | Freq: Once | INTRAMUSCULAR | Status: AC | PRN
  Administered 2013-05-11: 75 mL via INTRAVENOUS

## 2013-10-17 ENCOUNTER — Other Ambulatory Visit: Payer: Self-pay | Admitting: Orthopaedic Surgery

## 2013-10-17 DIAGNOSIS — M545 Low back pain, unspecified: Secondary | ICD-10-CM

## 2013-10-17 DIAGNOSIS — M542 Cervicalgia: Secondary | ICD-10-CM

## 2013-10-19 DIAGNOSIS — R310 Gross hematuria: Secondary | ICD-10-CM | POA: Insufficient documentation

## 2013-10-21 ENCOUNTER — Other Ambulatory Visit: Payer: 59

## 2013-10-29 ENCOUNTER — Other Ambulatory Visit: Payer: 59

## 2013-10-29 ENCOUNTER — Other Ambulatory Visit: Payer: Self-pay | Admitting: Orthopaedic Surgery

## 2013-10-29 ENCOUNTER — Ambulatory Visit
Admission: RE | Admit: 2013-10-29 | Discharge: 2013-10-29 | Disposition: A | Discharge status: Home / Self Care | Payer: No Typology Code available for payment source | Source: Ambulatory Visit

## 2013-10-29 ENCOUNTER — Ambulatory Visit
Admission: RE | Admit: 2013-10-29 | Discharge: 2013-10-29 | Disposition: A | Discharge status: Home / Self Care | Payer: No Typology Code available for payment source | Source: Ambulatory Visit | Attending: Orthopaedic Surgery | Admitting: Orthopaedic Surgery

## 2013-10-29 ENCOUNTER — Ambulatory Visit
Admission: RE | Admit: 2013-10-29 | Discharge: 2013-10-29 | Disposition: A | Discharge status: Home / Self Care | Payer: 59 | Source: Ambulatory Visit | Attending: Orthopaedic Surgery | Admitting: Orthopaedic Surgery

## 2013-10-29 DIAGNOSIS — M545 Low back pain, unspecified: Secondary | ICD-10-CM

## 2013-10-29 DIAGNOSIS — M542 Cervicalgia: Secondary | ICD-10-CM

## 2013-10-29 DIAGNOSIS — Z9889 Other specified postprocedural states: Secondary | ICD-10-CM

## 2013-11-14 ENCOUNTER — Other Ambulatory Visit (INDEPENDENT_AMBULATORY_CARE_PROVIDER_SITE_OTHER): Payer: Self-pay | Admitting: Otolaryngology

## 2013-11-14 DIAGNOSIS — E041 Nontoxic single thyroid nodule: Secondary | ICD-10-CM

## 2013-11-20 ENCOUNTER — Ambulatory Visit (HOSPITAL_COMMUNITY)
Admission: RE | Admit: 2013-11-20 | Discharge: 2013-11-20 | Disposition: A | Discharge status: Home / Self Care | Payer: No Typology Code available for payment source | Source: Ambulatory Visit | Attending: Otolaryngology | Admitting: Otolaryngology

## 2013-11-20 DIAGNOSIS — E041 Nontoxic single thyroid nodule: Secondary | ICD-10-CM | POA: Insufficient documentation

## 2013-11-20 DIAGNOSIS — E0789 Other specified disorders of thyroid: Secondary | ICD-10-CM | POA: Insufficient documentation

## 2013-11-20 DIAGNOSIS — E010 Iodine-deficiency related diffuse (endemic) goiter: Secondary | ICD-10-CM | POA: Diagnosis present

## 2014-04-09 IMAGING — CT CT CERVICAL SPINE W/O CM
2 of 5 series · 4 of 14 positions shown, 5 images · non-contrast
Comparison: None.

CLINICAL DATA: right orbital trauma/ frontal head trauma with
subconjunctival hemorrhage with edema on the right eye, (hx. Fall on
ice 3 pm 03/01/13). rule out orbital fracture or intracranial
hemorrhage

EXAM:
CT HEAD WITHOUT CONTRAST
CT MAXILLOFACIAL WITHOUT CONTRAST
CT CERVICAL SPINE WITHOUT CONTRAST
TECHNIQUE: Multidetector CT imaging of the head, cervical spine, and
maxillofacial structures were performed using the standard protocol
without intravenous contrast. Multiplanar CT image reconstructions
of the cervical spine and maxillofacial structures were also
generated.

[Series 3: facial st · axial · 0.29mm/px · z∈[+1310,+1372]mm · 2 of 94 slices shown, 3 images]
[im 32/94  soft-tissue]
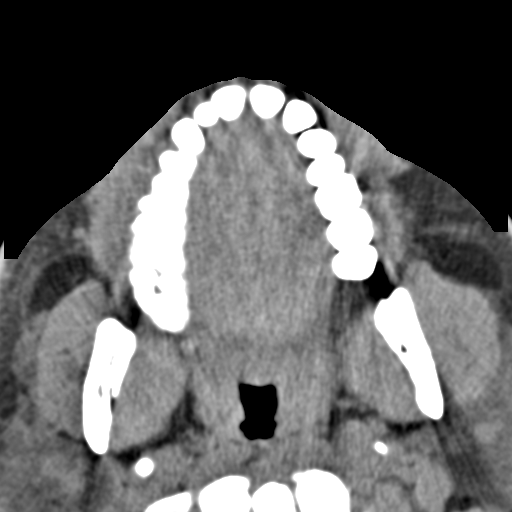
[im 32/94  bone]
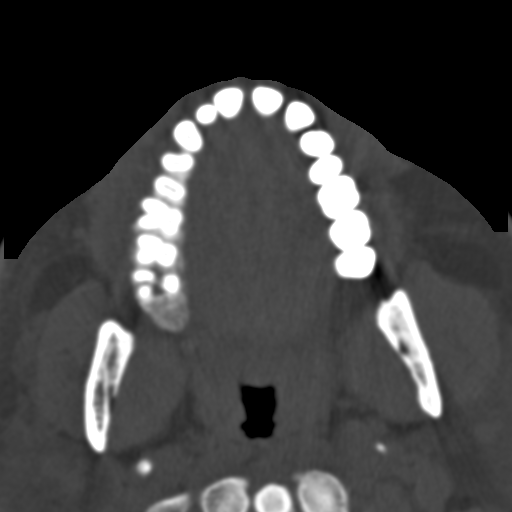
[im 63/94  bone]
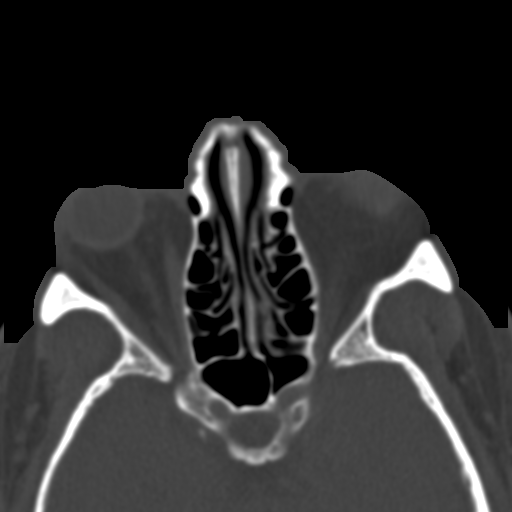

[Series 11: c-spine st · axial · 0.25mm/px · z∈[+1224,+1290]mm · 2 of 98 slices shown]
[im 33/98  bone]
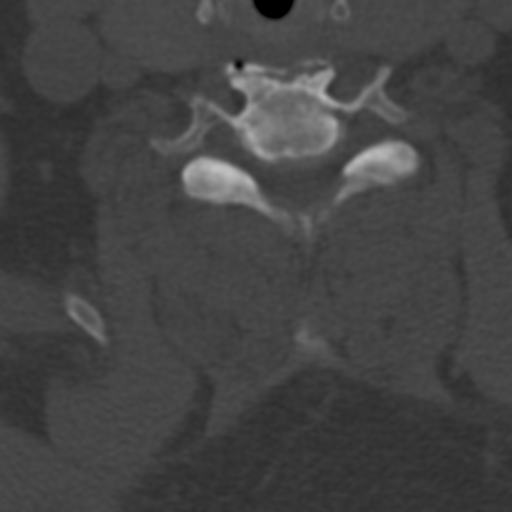
[im 65/98  bone]
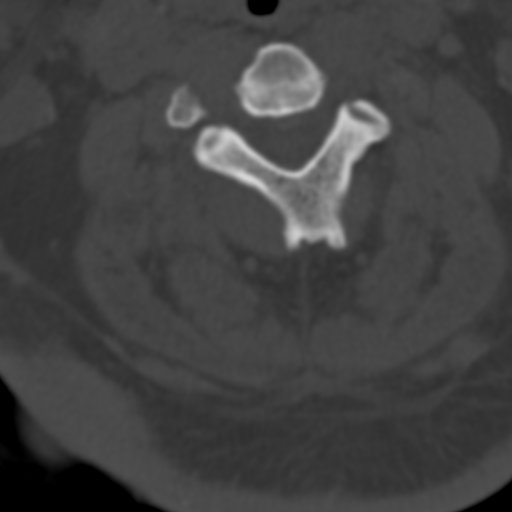

[4 of 14 positions shown; findings below may reference images not displayed]

FINDINGS: CT HEAD FINDINGS

Benign-appearing calvarial thickening. There is no evidence of acute
intracranial hemorrhage, brain edema, mass lesion, acute infarction,
mass effect, or midline shift. Acute infarct may be inapparent on
noncontrast CT. No other intra-axial abnormalities are seen, and the
ventricles and sulci are within normal limits in size and symmetry.
No abnormal extra-axial fluid collections or masses are identified.
No significant calvarial abnormality.

CT MAXILLOFACIAL FINDINGS

Orbits and globes intact. There is right preseptal soft tissue
swelling. Paranasal sinuses are normally developed and well aerated.
Negative for fracture. Mandible intact. Temporomandibular joints
seated bilaterally. Caries teeth 1 and 17. Dental restorations.

CT CERVICAL SPINE FINDINGS

Previous instrumented ACDF C3-C5, hardware intact without
surrounding lucency. Normal alignment. No prevertebral soft tissue
swelling. Anterior endplate spurring C5-6. Posterior endplate spurs
C7-T1 with bilateral foraminal encroachment. Negative for fracture.
Visualized lung apices clear. Thyroid mass or goiter , incompletely
visualized. Left carotid bifurcation calcified plaque.
IMPRESSION: 1. Negative for bleed or other acute intracranial process.
2. Negative for fracture or other acute bone abnormality.
3. Postop changes in the cervical spine with degenerative changes as
above.
4. Thyroid mass or goiter. Recommend elective outpatient ultrasound
for further characterization.
5. Dental caries.

## 2014-05-15 IMAGING — CR DG CHEST 2V
2 series · 2 of 2 positions shown · non-contrast
Comparison: None.

CLINICAL DATA: Shortness of Breath

EXAM:
CHEST  2 VIEW

[w chest pa]
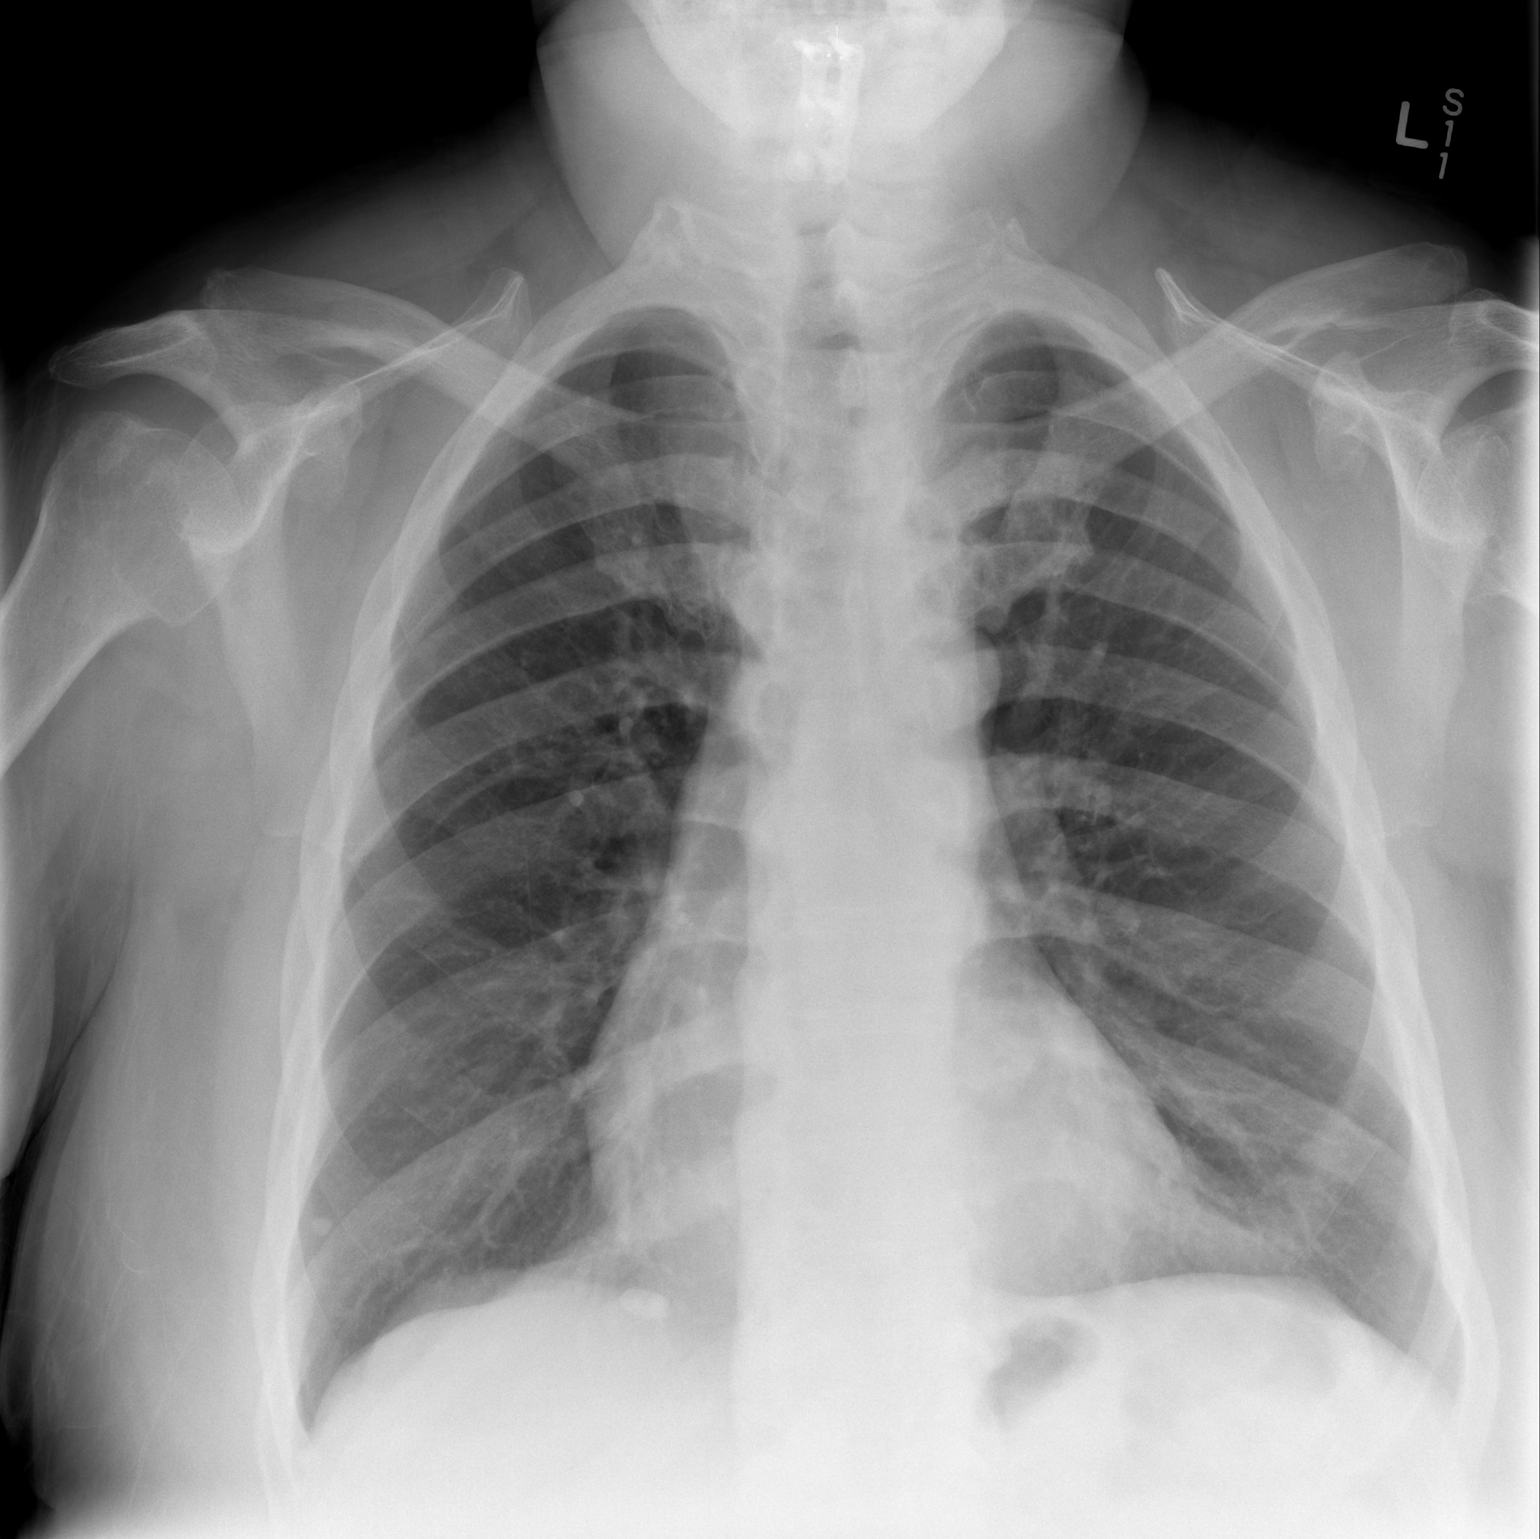

[w chest lat]
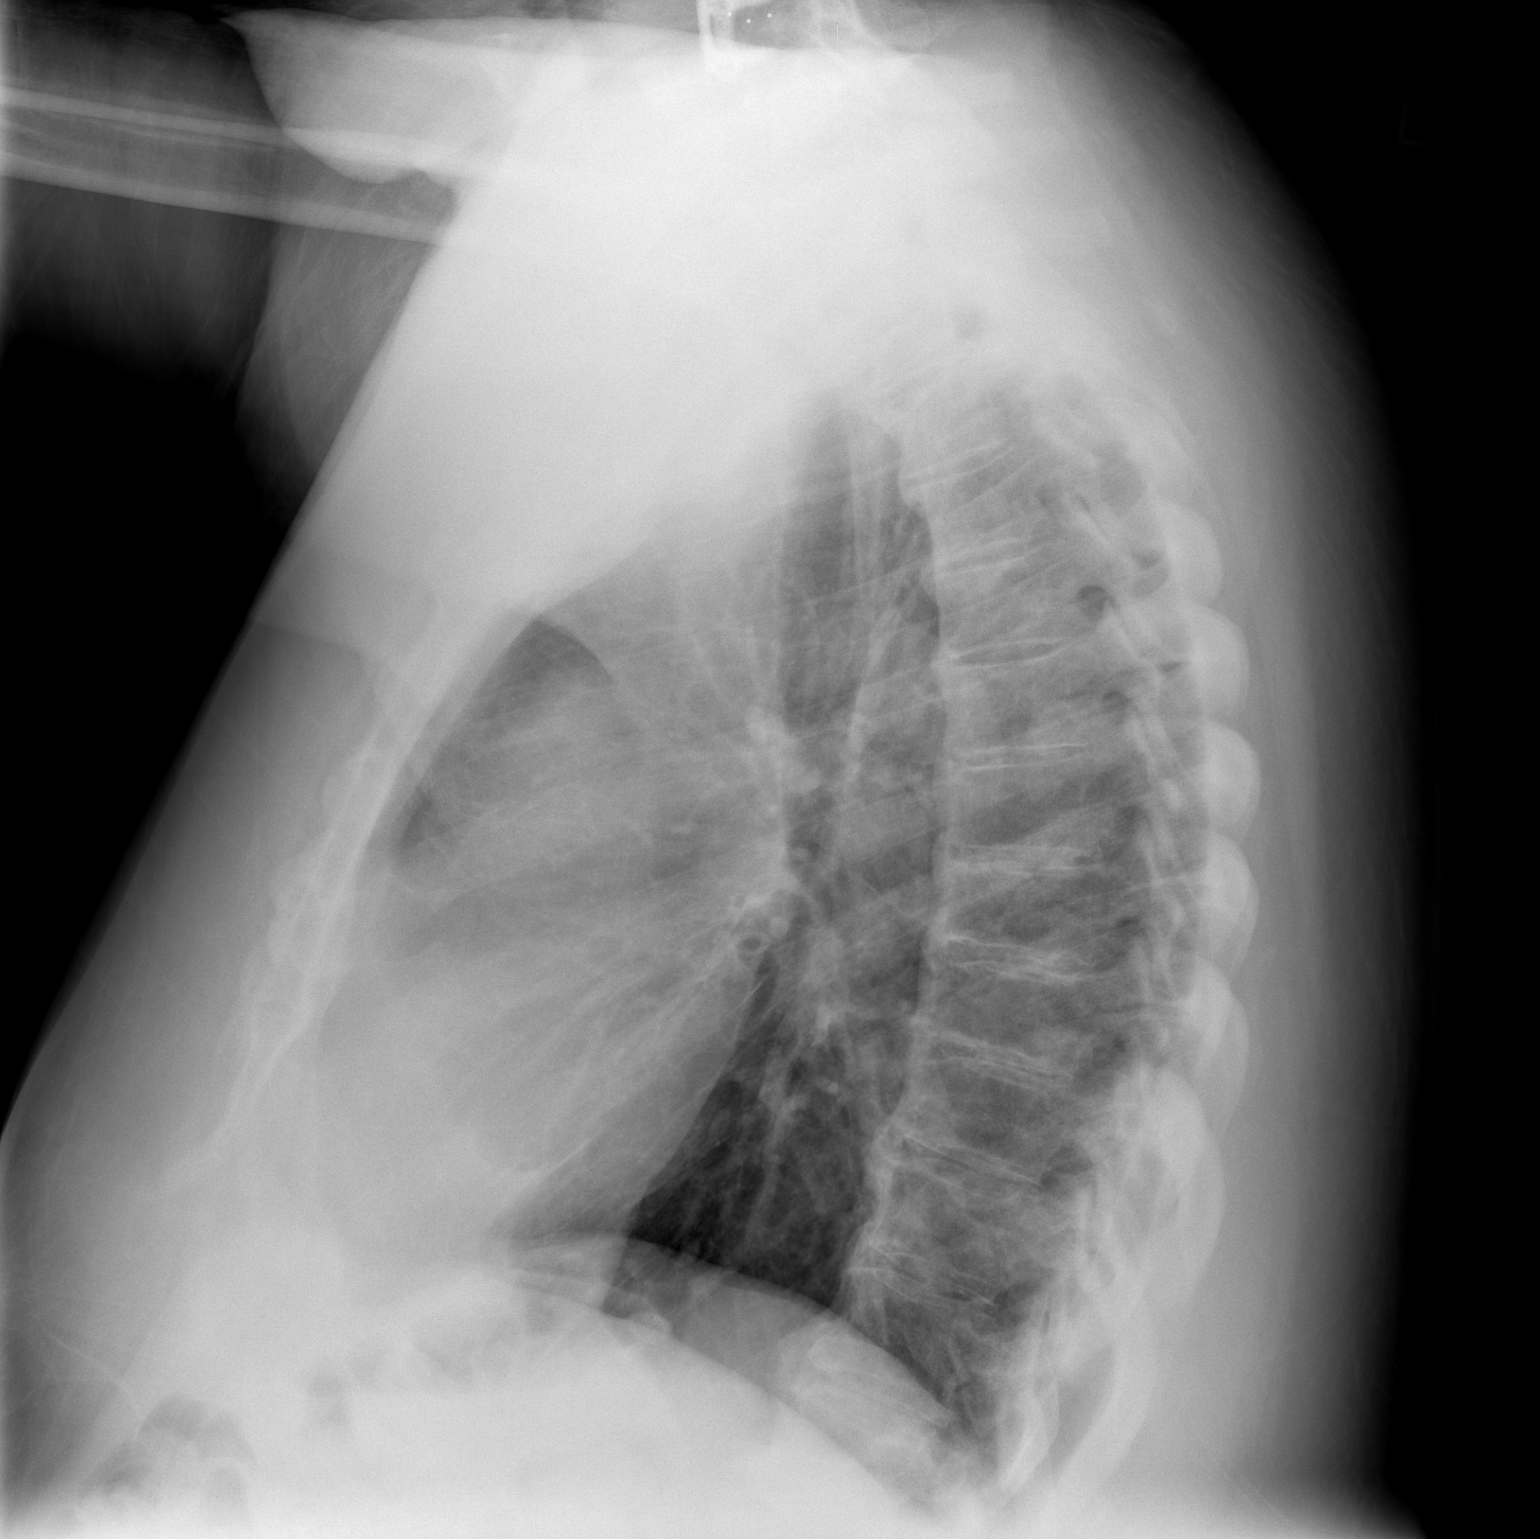

[2 of 2 positions shown; findings below may reference images not displayed]

FINDINGS: Cardiomediastinal silhouette is unremarkable. Mild hyperinflation.
Calcified granulomas are noted in right lower lobe. No acute
infiltrate or pulmonary edema. Degenerative changes thoracic spine.
IMPRESSION: No active disease. Mild hyperinflation. Degenerative changes
thoracic spine.

## 2014-05-17 DIAGNOSIS — N451 Epididymitis: Secondary | ICD-10-CM | POA: Insufficient documentation

## 2014-05-17 DIAGNOSIS — N5089 Other specified disorders of the male genital organs: Secondary | ICD-10-CM | POA: Insufficient documentation

## 2014-05-17 DIAGNOSIS — R319 Hematuria, unspecified: Secondary | ICD-10-CM | POA: Insufficient documentation

## 2014-06-14 DIAGNOSIS — N400 Enlarged prostate without lower urinary tract symptoms: Secondary | ICD-10-CM | POA: Insufficient documentation

## 2014-07-18 ENCOUNTER — Ambulatory Visit (INDEPENDENT_AMBULATORY_CARE_PROVIDER_SITE_OTHER): Payer: 59

## 2014-07-18 ENCOUNTER — Encounter: Payer: Self-pay | Admitting: Podiatry

## 2014-07-18 ENCOUNTER — Ambulatory Visit (INDEPENDENT_AMBULATORY_CARE_PROVIDER_SITE_OTHER): Payer: 59 | Admitting: Podiatry

## 2014-07-18 VITALS — BP 139/74 | HR 93 | Resp 13 | Ht 69.0 in | Wt 250.0 lb

## 2014-07-18 DIAGNOSIS — M129 Arthropathy, unspecified: Secondary | ICD-10-CM | POA: Diagnosis not present

## 2014-07-18 DIAGNOSIS — M79676 Pain in unspecified toe(s): Secondary | ICD-10-CM | POA: Diagnosis not present

## 2014-07-18 DIAGNOSIS — B351 Tinea unguium: Secondary | ICD-10-CM

## 2014-07-18 DIAGNOSIS — M19079 Primary osteoarthritis, unspecified ankle and foot: Secondary | ICD-10-CM

## 2014-07-18 DIAGNOSIS — M79661 Pain in right lower leg: Secondary | ICD-10-CM | POA: Diagnosis not present

## 2014-07-18 NOTE — Patient Instructions (Signed)
X-ray on the right ankle demonstrated some spurring and narrowing of the ankle joint suggested of wear and tear type arthritis. Activity to tolerance in regards to your walking or standing. If the right ankle becomes very uncomfortable with consider a cortisone injection into the ankle. A ankle brace might provide some relief  Diabetes and Foot Care Diabetes may cause you to have problems because of poor blood supply (circulation) to your feet and legs. This may cause the skin on your feet to become thinner, break easier, and heal more slowly. Your skin may become dry, and the skin may peel and crack. You may also have nerve damage in your legs and feet causing decreased feeling in them. You may not notice minor injuries to your feet that could lead to infections or more serious problems. Taking care of your feet is one of the most important things you can do for yourself.  HOME CARE INSTRUCTIONS  Wear shoes at all times, even in the house. Do not go barefoot. Bare feet are easily injured.  Check your feet daily for blisters, cuts, and redness. If you cannot see the bottom of your feet, use a mirror or ask someone for help.  Wash your feet with warm water (do not use hot water) and mild soap. Then pat your feet and the areas between your toes until they are completely dry. Do not soak your feet as this can dry your skin.  Apply a moisturizing lotion or petroleum jelly (that does not contain alcohol and is unscented) to the skin on your feet and to dry, brittle toenails. Do not apply lotion between your toes.  Trim your toenails straight across. Do not dig under them or around the cuticle. File the edges of your nails with an emery board or nail file.  Do not cut corns or calluses or try to remove them with medicine.  Wear clean socks or stockings every day. Make sure they are not too tight. Do not wear knee-high stockings since they may decrease blood flow to your legs.  Wear shoes that fit  properly and have enough cushioning. To break in new shoes, wear them for just a few hours a day. This prevents you from injuring your feet. Always look in your shoes before you put them on to be sure there are no objects inside.  Do not cross your legs. This may decrease the blood flow to your feet.  If you find a minor scrape, cut, or break in the skin on your feet, keep it and the skin around it clean and dry. These areas may be cleansed with mild soap and water. Do not cleanse the area with peroxide, alcohol, or iodine.  When you remove an adhesive bandage, be sure not to damage the skin around it.  If you have a wound, look at it several times a day to make sure it is healing.  Do not use heating pads or hot water bottles. They may burn your skin. If you have lost feeling in your feet or legs, you may not know it is happening until it is too late.  Make sure your health care provider performs a complete foot exam at least annually or more often if you have foot problems. Report any cuts, sores, or bruises to your health care provider immediately. SEEK MEDICAL CARE IF:   You have an injury that is not healing.  You have cuts or breaks in the skin.  You have an ingrown nail.  You notice redness on your legs or feet.  You feel burning or tingling in your legs or feet.  You have pain or cramps in your legs and feet.  Your legs or feet are numb.  Your feet always feel cold. SEEK IMMEDIATE MEDICAL CARE IF:   There is increasing redness, swelling, or pain in or around a wound.  There is a red line that goes up your leg.  Pus is coming from a wound.  You develop a fever or as directed by your health care provider.  You notice a bad smell coming from an ulcer or wound. Document Released: 12/27/1999 Document Revised: 08/31/2012 Document Reviewed: 06/07/2012 [facility] Patient Information 2015 [place], [place]. This information is not intended to replace advice given to you by your  health care provider. Make sure you discuss any questions you have with your health care provider.

## 2014-07-18 NOTE — Progress Notes (Signed)
   Subjective:    Patient ID: Dale Key, male    DOB: 09-11-1955, 59 y.o.   MRN: [id number]  HPI Comments: N ankle pain L right ankle D since 2014 O worsened in the last 2 years C stiffness and weakness A worse in the morning T pt states 6-7 years ago rx inserts, [facility] [address] and pt states he uses occasionally.  Pt request toenails to be trimmed.  Foot Pain Associated symptoms include arthralgias, coughing, numbness, a sore throat and weakness.  Diabetes Associated symptoms include polyuria and weakness.   Patient describes approximate seven-year history of right ankle pain that is increased in discomfort in the past 2 years. In the past he has worn some shoe insert as well as a ankle brace which is provided slight improvement. Patient describes back pain so she with injury and to surgical treatments with residual pain and gait disturbance  He also is complaining of painful toenails and requests toenail debridement.  He has a known diabetic and denies any history of foot ulceration or claudication   Review of Systems  HENT: Positive for sore throat and trouble swallowing.   Eyes: Positive for redness.  Respiratory: Positive for cough.   Endocrine: Positive for polyuria.  Genitourinary: Positive for frequency.  Musculoskeletal: Positive for back pain and arthralgias.  Neurological: Positive for weakness and numbness.  All other systems reviewed and are negative.      Objective:   Physical Exam  Orientated 3  Vascular: Pitting edema bilaterally DP and PT pulses 2/4 bilaterally Capillary reflex immediate bilaterally  Neurological: Sensation to 10 g monofilament wire intact 5/5 right and 4/5 left Vibratory sensation intact bilaterally Ankle reflex equal and reactive bilaterally  Dermatological: No open skin lesions noted bilaterally Surgical scar dorsal aspect of left foot (patient relates a history of hand surgery and using  tenderness and foot for these procedures) The toenails are extremely elongated, discolored, incurvated, hypertrophic with subungual debris and tender to direct palpation  Musculoskeletal: Pes planus bilaterally 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 Patient has a slow flat-footed gait using a cane Limited range of motion of the subtalar joints bilaterally Ankle joints dorsi flexion 90 without obvious crepitus or pain on range of motion  X-ray examination right ankle  Intact bony structure without a fracture and/or dislocation Increased soft tissue density without emphysema Decrease joint space in the ankle mortise Lateral view demonstrates bony irregularity in around the talar dome area Lateral view demonstrates bony irregularity dorsal midfoot Posterior and inferior calcaneal spurs  Radiographic impression: Degenerative osteoarthritic changes ankle and midfoot Increased soft tissue density consistent with edema No acute bony abnormality noted    Assessment & Plan:   Assessment: Satisfactory vascular status Protective sensation intact bilaterally Peripheral edema bilaterally 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 I made patient aware that his arterial pulse and protective sensation were intact. I made aware that he had osteoarthritis in his right ankle and midfoot which most likely was the primary cause of his ankle pain. I told with the ankle pain increased I could offer him an ankle injection of cortisone to reduce some of the symptoms. I recommended that he wears existing ankle brace.  The toenails 10 were debrided mechanically electrically without any bleeding  Reappoint 3 months or at patient's request

## 2014-07-19 ENCOUNTER — Encounter: Payer: Self-pay | Admitting: Podiatry

## 2014-10-17 ENCOUNTER — Ambulatory Visit: Payer: 59 | Admitting: Podiatry

## 2015-07-10 ENCOUNTER — Ambulatory Visit (INDEPENDENT_AMBULATORY_CARE_PROVIDER_SITE_OTHER): Payer: Medicare Other | Admitting: Podiatry

## 2015-07-10 ENCOUNTER — Encounter: Payer: Self-pay | Admitting: Podiatry

## 2015-07-10 DIAGNOSIS — M79676 Pain in unspecified toe(s): Secondary | ICD-10-CM

## 2015-07-10 DIAGNOSIS — B351 Tinea unguium: Secondary | ICD-10-CM

## 2015-07-10 NOTE — Patient Instructions (Signed)
Diabetes and Foot Care Diabetes may cause you to have problems because of poor blood supply (circulation) to your feet and legs. This may cause the skin on your feet to become thinner, break easier, and heal more slowly. Your skin may become dry, and the skin may peel and crack. You may also have nerve damage in your legs and feet causing decreased feeling in them. You may not notice minor injuries to your feet that could lead to infections or more serious problems. Taking care of your feet is one of the most important things you can do for yourself.  HOME CARE INSTRUCTIONS  Wear shoes at all times, even in the house. Do not go barefoot. Bare feet are easily injured.  Check your feet daily for blisters, cuts, and redness. If you cannot see the bottom of your feet, use a mirror or ask someone for help.  Wash your feet with warm water (do not use hot water) and mild soap. Then pat your feet and the areas between your toes until they are completely dry. Do not soak your feet as this can dry your skin.  Apply a moisturizing lotion or petroleum jelly (that does not contain alcohol and is unscented) to the skin on your feet and to dry, brittle toenails. Do not apply lotion between your toes.  Trim your toenails straight across. Do not dig under them or around the cuticle. File the edges of your nails with an emery board or nail file.  Do not cut corns or calluses or try to remove them with medicine.  Wear clean socks or stockings every day. Make sure they are not too tight. Do not wear knee-high stockings since they may decrease blood flow to your legs.  Wear shoes that fit properly and have enough cushioning. To break in new shoes, wear them for just a few hours a day. This prevents you from injuring your feet. Always look in your shoes before you put them on to be sure there are no objects inside.  Do not cross your legs. This may decrease the blood flow to your feet.  If you find a minor scrape,  cut, or break in the skin on your feet, keep it and the skin around it clean and dry. These areas may be cleansed with mild soap and water. Do not cleanse the area with peroxide, alcohol, or iodine.  When you remove an adhesive bandage, be sure not to damage the skin around it.  If you have a wound, look at it several times a day to make sure it is healing.  Do not use heating pads or hot water bottles. They may burn your skin. If you have lost feeling in your feet or legs, you may not know it is happening until it is too late.  Make sure your health care provider performs a complete foot exam at least annually or more often if you have foot problems. Report any cuts, sores, or bruises to your health care provider immediately. SEEK MEDICAL CARE IF:   You have an injury that is not healing.  You have cuts or breaks in the skin.  You have an ingrown nail.  You notice redness on your legs or feet.  You feel burning or tingling in your legs or feet.  You have pain or cramps in your legs and feet.  Your legs or feet are numb.  Your feet always feel cold. SEEK IMMEDIATE MEDICAL CARE IF:   There is increasing redness,   swelling, or pain in or around a wound.  There is a red line that goes up your leg.  Pus is coming from a wound.  You develop a fever or as directed by your health care provider.  You notice a bad smell coming from an ulcer or wound.   This information is not intended to replace advice given to you by your health care provider. Make sure you discuss any questions you have with your health care provider.   Document Released: 12/27/1999 Document Revised: 08/31/2012 Document Reviewed: 06/07/2012 [employer] Interactive Patient Education 2016 [employer]  

## 2015-07-11 NOTE — Progress Notes (Signed)
Patient ID: Dale Key, male   DOB: [birth year], 60 y.o.   MRN: [id number]   Subjective: This patient presents today complaining of thickened elongated toenails which are comfortable walking wearing shoes and request toenail debridement. He was last evaluated in our office on 07/18/2015 with a recommendation to return at three-month intervals for debridement, however, does not present until today for this service  Patient is diabetic and denies history of claudication, amputation or foot ulceration   Orientated 3  Vascular: Pitting edema bilaterally DP and PT pulses 2/4 bilaterally Capillary reflex immediate bilaterally  Neurological: Sensation to 10 g monofilament wire intact 5/5 right and 4/5 left Vibratory sensation intact bilaterally Ankle reflex equal and reactive bilaterally  Dermatological: No open skin lesions noted bilaterally Surgical scar dorsal aspect of left foot (patient relates a history of hand surgery and using tenderness and foot for these procedures) The toenails are extremely elongated, discolored, incurvated, hypertrophic with subungual debris and tender to direct palpation  Musculoskeletal: Pes planus bilaterally 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

## 2015-09-27 ENCOUNTER — Encounter (HOSPITAL_COMMUNITY): Payer: Self-pay | Admitting: Emergency Medicine

## 2015-09-27 ENCOUNTER — Ambulatory Visit: Payer: Medicare Other

## 2015-09-27 ENCOUNTER — Ambulatory Visit (INDEPENDENT_AMBULATORY_CARE_PROVIDER_SITE_OTHER): Payer: Medicare Other

## 2015-09-27 ENCOUNTER — Ambulatory Visit (HOSPITAL_COMMUNITY)
Admission: EM | Admit: 2015-09-27 | Discharge: 2015-09-27 | Disposition: A | Discharge status: Home / Self Care | Payer: Medicare Other | Attending: Family Medicine | Admitting: Family Medicine

## 2015-09-27 DIAGNOSIS — E119 Type 2 diabetes mellitus without complications: Secondary | ICD-10-CM | POA: Diagnosis not present

## 2015-09-27 DIAGNOSIS — M47814 Spondylosis without myelopathy or radiculopathy, thoracic region: Secondary | ICD-10-CM | POA: Diagnosis not present

## 2015-09-27 DIAGNOSIS — Z7982 Long term (current) use of aspirin: Secondary | ICD-10-CM | POA: Diagnosis not present

## 2015-09-27 DIAGNOSIS — J029 Acute pharyngitis, unspecified: Secondary | ICD-10-CM | POA: Diagnosis not present

## 2015-09-27 DIAGNOSIS — Z9889 Other specified postprocedural states: Secondary | ICD-10-CM | POA: Diagnosis not present

## 2015-09-27 DIAGNOSIS — R05 Cough: Secondary | ICD-10-CM | POA: Insufficient documentation

## 2015-09-27 DIAGNOSIS — E785 Hyperlipidemia, unspecified: Secondary | ICD-10-CM | POA: Insufficient documentation

## 2015-09-27 DIAGNOSIS — Z981 Arthrodesis status: Secondary | ICD-10-CM | POA: Diagnosis not present

## 2015-09-27 DIAGNOSIS — H409 Unspecified glaucoma: Secondary | ICD-10-CM | POA: Insufficient documentation

## 2015-09-27 DIAGNOSIS — R059 Cough, unspecified: Secondary | ICD-10-CM

## 2015-09-27 DIAGNOSIS — Z87891 Personal history of nicotine dependence: Secondary | ICD-10-CM | POA: Insufficient documentation

## 2015-09-27 DIAGNOSIS — I10 Essential (primary) hypertension: Secondary | ICD-10-CM | POA: Insufficient documentation

## 2015-09-27 DIAGNOSIS — J8410 Pulmonary fibrosis, unspecified: Secondary | ICD-10-CM | POA: Diagnosis not present

## 2015-09-27 DIAGNOSIS — Z7984 Long term (current) use of oral hypoglycemic drugs: Secondary | ICD-10-CM | POA: Insufficient documentation

## 2015-09-27 LAB — POCT RAPID STREP A: Streptococcus, Group A Screen (Direct): NEGATIVE

## 2015-09-27 MED ORDER — IBUPROFEN 100 MG/5ML PO SUSP: 400.0000 mg | Freq: Four times a day (QID) | ORAL | 0 refills | Status: DC | PRN

## 2015-09-27 MED ORDER — AZITHROMYCIN 250 MG PO TABS: 250.0000 mg | ORAL_TABLET | Freq: Every day | ORAL | 0 refills | Status: DC

## 2015-09-27 MED ORDER — PREDNISONE 20 MG PO TABS: ORAL_TABLET | ORAL | 0 refills | Status: DC

## 2015-09-27 NOTE — ED Triage Notes (Signed)
Pt here for ST onset yest associated w/odynophagia  Denies fevers.... A&O x4... NAD

## 2015-09-27 NOTE — Discharge Instructions (Signed)
°  You have been prescribed prednisone, a steroid, to help with swelling and pain in your throat. This medication can cause your blood sugar to elevate a little.  Be sure to monitor your blood sugar while taking.  You may take 400-600mg  Ibuprofen (Motrin) every 6-8 hours for fever and pain  Alternate with Tylenol  You may take 500mg  Tylenol every 4-6 hours as needed for fever and pain  Follow-up with your primary care provider next week for recheck of symptoms if not improving.  Be sure to drink plenty of fluids and rest, at least 8hrs of sleep a night, preferably more while you are sick. Return urgent care or go to closest ER if you cannot keep down fluids/signs of dehydration, fever not reducing with Tylenol, difficulty breathing/wheezing, stiff neck, worsening condition, or other concerns (see below)   Your symptoms are likely due to a virus such as the common cold, however, if you developing worsening chest congestion with shortness of breath, persistent fever for 3 days, or symptoms not improving in 4-5 days, you may fill the antibiotic (azithromycin).  If you do fill the antibiotic,  please take antibiotics as prescribed and be sure to complete entire course even if you start to feel better to ensure infection does not come back.

## 2015-09-27 NOTE — ED Provider Notes (Signed)
CSN: [id number]     Arrival date & time 09/27/15  1126 History   First MD Initiated Contact with Patient 09/27/15 1234     Chief Complaint  Patient presents with  . Sore Throat   (Consider location/radiation/quality/duration/timing/severity/associated sxs/prior Treatment) HPI  Dale Key is a 60 y.o. male presenting to UC with c/o gradually worsening sore throat with associated post nasal drip and mild intermittent, gradually worsening cough from post-nasal drip for about 4 days.  Throat pain is worse with swallowing, 9/10. Minimal temporary relief with chloraseptic spray and throat lozenges.  Denies chest pain or SOB. Denies fever, chills, n/v/d. No sick contacts or recent travel.    Past Medical History:  Diagnosis Date  . Diabetes mellitus without complication (HCC)   . Glaucoma   . Hyperlipidemia   . Hypertension    Past Surgical History:  Procedure Laterality Date  . ANTERIOR CERVICAL DECOMP/DISCECTOMY FUSION N/A 10/07/2012   Procedure: ANTERIOR CERVICAL DECOMPRESSION/DISCECTOMY FUSION 2 LEVELS;  Surgeon: Karn Cassis, MD;  Location: [facility] OR;  Service: Neurosurgery;  Laterality: N/A;  Anterior Cervical Three-Four/Four-Five Decompression and Fusion  . EYE SURGERY    . FOOT SURGERY Left    Pt states a tendon was removed to replace one in right hand.  Marland Kitchen HAND SURGERY Right    History reviewed. No pertinent family history. Social History  Substance Use Topics  . Smoking status: Former Smoker    Quit date: 01/13/2012  . Smokeless tobacco: Never Used  . Alcohol use Yes   OB History    No data available     Review of Systems  Constitutional: Negative for chills and fever.  HENT: Positive for congestion, postnasal drip and sore throat. Negative for ear pain, trouble swallowing and voice change.   Respiratory: Positive for cough. Negative for shortness of breath.   Cardiovascular: Negative for chest pain and palpitations.  Gastrointestinal: Negative for abdominal pain,  diarrhea, nausea and vomiting.  Musculoskeletal: Negative for arthralgias, back pain and myalgias.  Skin: Negative for rash.    Allergies  Review of patient's allergies indicates no known allergies.  Home Medications   Prior to Admission medications   Medication Sig Start Date End Date Taking? Authorizing Provider  furosemide (LASIX) 40 MG tablet Take 1 tablet (40 mg total) by mouth daily. 10/20/12  Yes Daniel J Angiulli, PA-C  losartan (COZAAR) 100 MG tablet Take 500 mg by mouth every morning. Take 1/2 tablet 10/20/12  Yes Daniel J Angiulli, PA-C  metFORMIN (GLUCOPHAGE) 1000 MG tablet Take 1 tablet (1,000 mg total) by mouth 2 (two) times daily with a meal. 10/20/12  Yes Daniel J Angiulli, PA-C  metoprolol succinate (TOPROL-XL) 50 MG 24 hr tablet Take 1 tablet (50 mg total) by mouth every morning. Take with or immediately following a meal. 10/20/12  Yes Daniel J Angiulli, PA-C  Multiple Vitamin (MULTIVITAMIN WITH MINERALS) TABS tablet Take 1 tablet by mouth every morning.   Yes Historical Provider, MD  potassium chloride SA (K-DUR,KLOR-CON) 20 MEQ tablet Take 1 tablet (20 mEq total) by mouth daily as needed (For potassium loss.). 10/20/12  Yes Daniel J Angiulli, PA-C  rosuvastatin (CRESTOR) 10 MG tablet Take 10 mg by mouth daily.   Yes Historical Provider, MD  tamsulosin (FLOMAX) 0.4 MG CAPS capsule Take 0.4 mg by mouth.   Yes Historical Provider, MD  aspirin 81 MG tablet Take 81 mg by mouth daily.    Historical Provider, MD  azithromycin (ZITHROMAX) 250 MG tablet Take 1 tablet (250  mg total) by mouth daily. Take first 2 tablets together, then 1 every day until finished. 09/27/15   Junius FinnerErin O'Malley, PA-C  ibuprofen (CHILD IBUPROFEN) 100 MG/5ML suspension Take 20 mLs (400 mg total) by mouth every 6 (six) hours as needed for fever, mild pain or moderate pain. 09/27/15   Junius FinnerErin O'Malley, PA-C  predniSONE (DELTASONE) 20 MG tablet 3 tabs po day one, then 2 po daily x 4 days 09/27/15   Junius FinnerErin O'Malley, PA-C    Meds Ordered and Administered this Visit  Medications - No data to display  BP 158/74 (BP Location: Left Arm)   Pulse 68   Temp 98.1 F (36.7 C) (Oral)   Resp 16   SpO2 100%  No data found.   Physical Exam  Constitutional: He appears well-developed and well-nourished.  HENT:  Head: Normocephalic and atraumatic.  Right Ear: Tympanic membrane normal.  Left Ear: Tympanic membrane normal.  Nose: Nose normal.  Mouth/Throat: Uvula is midline and mucous membranes are normal. Posterior oropharyngeal edema and posterior oropharyngeal erythema present. No oropharyngeal exudate or tonsillar abscesses.  Eyes: Conjunctivae are normal. No scleral icterus.  Neck: Normal range of motion. Neck supple.  Cardiovascular: Normal rate, regular rhythm and normal heart sounds.   Pulmonary/Chest: Effort normal. No stridor. No respiratory distress. He has no wheezes. He has rhonchi in the right upper field and the left upper field. He has no rales.  Diffuse rhonchi in upper lung fields. No respiratory distress. No accessory muscle use.  Abdominal: Soft. He exhibits no distension. There is no tenderness.  Musculoskeletal: Normal range of motion.  Lymphadenopathy:    He has cervical adenopathy.  Neurological: He is alert.  Skin: Skin is warm and dry.  Nursing note and vitals reviewed.   Urgent Care Course   Clinical Course    Procedures (including critical care time)  Labs Review Labs Reviewed  CULTURE, GROUP A STREP [facility]  POCT RAPID STREP A    Imaging Review Dg Chest 2 View  Result Date: 09/27/2015 CLINICAL DATA:  Cough for 5 days.  Sore throat.  No fever. EXAM: CHEST  2 VIEW COMPARISON:  04/06/2013 FINDINGS: Again noted are calcified granulomas at the right lung base. Heart and mediastinum are within normal limits. Few densities at the left lung base are suggestive for atelectasis. No significant airspace disease or pulmonary edema. Degenerative changes in the thoracic spine.  IMPRESSION: No active cardiopulmonary disease. Electronically Signed   By: Richarda OverlieAdam  Henn M.D.   On: 09/27/2015 13:12      MDM   1. Pharyngitis   2. Cough    Pt c/o sore throat and congestion for about 4 days.  Tonsillar erythema and edema noted on exam w/o evidence of abscess. Lungs: rhonchi in upper lungs fields bilaterally. No respiratory distress. O2 Sat 100% on RA.  Rapid strep: NEGATIVE CXR: no active cardiopulmonary disease noted.  Symptoms likely viral, Strep culture sent. Rx: prednisone (advised to monitor if CBG as it may increase), liquid ibuprofen and prescription to hold of Azithromycin if symptoms not improving. Home care instructions provided. Patient verbalized understanding and agreement with treatment plan.     Junius Finnerrin O'Malley, PA-C 09/27/15 [phone]

## 2015-10-01 LAB — CULTURE, GROUP A STREP (THRC)

## 2015-10-16 ENCOUNTER — Ambulatory Visit (INDEPENDENT_AMBULATORY_CARE_PROVIDER_SITE_OTHER): Payer: Medicare Other | Admitting: Podiatry

## 2015-10-16 NOTE — Progress Notes (Signed)
ERRONEOUS ENCOUNTER NO SHOW  

## 2015-11-14 DIAGNOSIS — I10 Essential (primary) hypertension: Secondary | ICD-10-CM | POA: Insufficient documentation

## 2015-11-14 DIAGNOSIS — E119 Type 2 diabetes mellitus without complications: Secondary | ICD-10-CM | POA: Insufficient documentation

## 2015-11-19 DIAGNOSIS — R7989 Other specified abnormal findings of blood chemistry: Secondary | ICD-10-CM | POA: Insufficient documentation

## 2015-12-02 DIAGNOSIS — R1312 Dysphagia, oropharyngeal phase: Secondary | ICD-10-CM | POA: Insufficient documentation

## 2015-12-22 DIAGNOSIS — J3802 Paralysis of vocal cords and larynx, bilateral: Secondary | ICD-10-CM | POA: Insufficient documentation

## 2015-12-22 DIAGNOSIS — J3801 Paralysis of vocal cords and larynx, unilateral: Secondary | ICD-10-CM | POA: Insufficient documentation

## 2016-01-06 ENCOUNTER — Emergency Department (HOSPITAL_COMMUNITY)
Admission: EM | Admit: 2016-01-06 | Discharge: 2016-01-06 | Disposition: A | Discharge status: Home / Self Care | Payer: Medicare Other | Attending: Emergency Medicine | Admitting: Emergency Medicine

## 2016-01-06 ENCOUNTER — Encounter (HOSPITAL_COMMUNITY): Payer: Self-pay | Admitting: Emergency Medicine

## 2016-01-06 DIAGNOSIS — I10 Essential (primary) hypertension: Secondary | ICD-10-CM | POA: Diagnosis not present

## 2016-01-06 DIAGNOSIS — E119 Type 2 diabetes mellitus without complications: Secondary | ICD-10-CM | POA: Insufficient documentation

## 2016-01-06 DIAGNOSIS — Z7982 Long term (current) use of aspirin: Secondary | ICD-10-CM | POA: Diagnosis not present

## 2016-01-06 DIAGNOSIS — Z7984 Long term (current) use of oral hypoglycemic drugs: Secondary | ICD-10-CM | POA: Insufficient documentation

## 2016-01-06 DIAGNOSIS — K5900 Constipation, unspecified: Secondary | ICD-10-CM | POA: Diagnosis present

## 2016-01-06 DIAGNOSIS — Z79899 Other long term (current) drug therapy: Secondary | ICD-10-CM | POA: Diagnosis not present

## 2016-01-06 DIAGNOSIS — Z87891 Personal history of nicotine dependence: Secondary | ICD-10-CM | POA: Insufficient documentation

## 2016-01-06 MED ORDER — POLYETHYLENE GLYCOL 3350 17 GM/SCOOP PO POWD: 17.0000 g | Freq: Two times a day (BID) | ORAL | 0 refills | Status: DC

## 2016-01-06 MED ORDER — MILK AND MOLASSES ENEMA
1.0000 | Freq: Once | RECTAL | Status: AC
  Administered 2016-01-06: 250 mL via RECTAL
  Filled 2016-01-06: qty 250

## 2016-01-06 NOTE — ED Triage Notes (Signed)
Pt states "ive been backed up since yesterday, ive got pain in my rectum, every time I try to go I cant"

## 2016-01-06 NOTE — ED Notes (Signed)
A11 sent for enema kit at this time.

## 2016-01-06 NOTE — ED Notes (Signed)
Pt. Only received small amount of enema due to fecal impaction. EDP made aware.

## 2016-01-06 NOTE — ED Provider Notes (Signed)
[facility] DEPT Provider Note   CSN: [id number] Arrival date & time: 01/06/16  1316     History   Chief Complaint Chief Complaint  Patient presents with  . Constipation    HPI Dale Key is a 60 y.o. male.  HPI   Patient is a 60 year old male with history of hypertension and diabetes who presents the ED with complaint of constipation. Patient reports he has history of constipation but notes he has been unable to have a bowel movement for the past 4 days. He reports taking milk of magnesia at home without relief. Patient states he has continued eating and drinking foods but notes he is very sedentary. Denies use of pain meds. Patient notes he was [facility] approximately 3 weeks ago due to aspiration resulting in him collapse of one of his lungs. Patient reports being hospitalized for approximately a month and having a trach and G-tube placed. Patient states he has been using his G-tube for feeding but notes he has only been eating by mouth for the past couple of days. Denies fever, chills, chest pain, shortness of breath, abdominal pain, nausea, vomiting, diarrhea, urinary symptoms. Patient reports having a follow-up appointment on 1/4 for follow-up regarding his trach and G-tube.  Past Medical History:  Diagnosis Date  . Diabetes mellitus without complication (HCC)   . Glaucoma   . Hyperlipidemia   . Hypertension     Patient Active Problem List   Diagnosis Date Noted  . Central cord syndrome [facility](HCC) 10/13/2012    Past Surgical History:  Procedure Laterality Date  . ANTERIOR CERVICAL DECOMP/DISCECTOMY FUSION N/A 10/07/2012   Procedure: ANTERIOR CERVICAL DECOMPRESSION/DISCECTOMY FUSION 2 LEVELS;  Surgeon: Karn CassisErnesto M Botero, MD;  [facility];  Service: Neurosurgery;  Laterality: N/A;  Anterior Cervical Three-Four/Four-Five Decompression and Fusion  . EYE SURGERY    . FOOT SURGERY Left    Pt states a tendon was removed to replace one in right hand.    Marland Kitchen. HAND SURGERY Right     OB History    No data available       Home Medications    Prior to Admission medications   Medication Sig Start Date End Date Taking? Authorizing Provider  aspirin 81 MG tablet Take 81 mg by mouth daily.    Historical Provider, MD  azithromycin (ZITHROMAX) 250 MG tablet Take 1 tablet (250 mg total) by mouth daily. Take first 2 tablets together, then 1 every day until finished. 09/27/15   Junius FinnerErin O'Malley, PA-C  furosemide (LASIX) 40 MG tablet Take 1 tablet (40 mg total) by mouth daily. 10/20/12   Mcarthur Rossettianiel J Angiulli, PA-C  ibuprofen (CHILD IBUPROFEN) 100 MG/5ML suspension Take 20 mLs (400 mg total) by mouth every 6 (six) hours as needed for fever, mild pain or moderate pain. 09/27/15   Junius FinnerErin O'Malley, PA-C  losartan (COZAAR) 100 MG tablet Take 500 mg by mouth every morning. Take 1/2 tablet 10/20/12   Mcarthur Rossettianiel J Angiulli, PA-C  metFORMIN (GLUCOPHAGE) 1000 MG tablet Take 1 tablet (1,000 mg total) by mouth 2 (two) times daily with a meal. 10/20/12   Mcarthur Rossettianiel J Angiulli, PA-C  metoprolol succinate (TOPROL-XL) 50 MG 24 hr tablet Take 1 tablet (50 mg total) by mouth every morning. Take with or immediately following a meal. 10/20/12   Mcarthur Rossettianiel J Angiulli, PA-C  Multiple Vitamin (MULTIVITAMIN WITH MINERALS) TABS tablet Take 1 tablet by mouth every morning.    Historical Provider, MD  polyethylene glycol powder (GLYCOLAX/MIRALAX) powder Take 17 g  by mouth 2 (two) times daily. 01/06/16   Everlene Farrier, PA-C  potassium chloride SA (K-DUR,KLOR-CON) 20 MEQ tablet Take 1 tablet (20 mEq total) by mouth daily as needed (For potassium loss.). 10/20/12   Mcarthur Rossetti Angiulli, PA-C  predniSONE (DELTASONE) 20 MG tablet 3 tabs po day one, then 2 po daily x 4 days 09/27/15   Junius Finner, PA-C  rosuvastatin (CRESTOR) 10 MG tablet Take 10 mg by mouth daily.    Historical Provider, MD  tamsulosin (FLOMAX) 0.4 MG CAPS capsule Take 0.4 mg by mouth.    Historical Provider, MD    Family History No family  history on file.  Social History Social History  Substance Use Topics  . Smoking status: Former Smoker    Quit date: 01/13/2012  . Smokeless tobacco: Never Used  . Alcohol use Yes     Allergies   Patient has no known allergies.   Review of Systems Review of Systems  Gastrointestinal: Positive for constipation.  All other systems reviewed and are negative.    Physical Exam Updated Vital Signs BP 125/62   Pulse 89   Temp 98.6 F (37 C) (Oral)   Resp 18   SpO2 98%   Physical Exam  Constitutional: He is oriented to person, place, and time. He appears well-developed and well-nourished.  Morbidly obese male  HENT:  Head: Normocephalic and atraumatic.  Mouth/Throat: Uvula is midline, oropharynx is clear and moist and mucous membranes are normal. No oropharyngeal exudate, posterior oropharyngeal edema, posterior oropharyngeal erythema or tonsillar abscesses. No tonsillar exudate.  Eyes: Conjunctivae and EOM are normal. Right eye exhibits no discharge. Left eye exhibits no discharge. No scleral icterus.  Neck: Normal range of motion. Neck supple.  Janina Mayo present  Cardiovascular: Normal rate, regular rhythm, normal heart sounds and intact distal pulses.   Pulmonary/Chest: Effort normal and breath sounds normal. No respiratory distress. He has no wheezes. He has no rales. He exhibits no tenderness.  Abdominal: Soft. Bowel sounds are normal. He exhibits no distension and no mass. There is no tenderness. There is no rebound and no guarding. No hernia.  Genitourinary: Rectal exam shows no external hemorrhoid, no internal hemorrhoid, no fissure, no mass, no tenderness and anal tone normal.  Genitourinary Comments: Stool impaction present on DRE. No gross blood noted.  Musculoskeletal: Normal range of motion. He exhibits no edema.  Neurological: He is alert and oriented to person, place, and time.  Skin: Skin is warm and dry.  Nursing note and vitals reviewed.    ED Treatments /  Results  Labs (all labs ordered are listed, but only abnormal results are displayed) Labs Reviewed - No data to display  EKG  EKG Interpretation None       Radiology No results found.  Procedures Procedures (including critical care time)  Medications Ordered in ED Medications  milk and molasses enema (250 mLs Rectal Given 01/06/16 1520)     Initial Impression / Assessment and Plan / ED Course  I have reviewed the triage vital signs and the nursing notes.  Pertinent labs & imaging results that were available during my care of the patient were reviewed by me and considered in my medical decision making (see chart for details).  Clinical Course     Patient presented constipation for the past 4 days. No relief with local magnesium at home. Reports history of chronic constipation. Denies fever or abdominal pain. VSS. Stool impaction noted on digital rectal exam, abdominal exam benign. Patient received enema in  the ED and was able to have a large bowel movement with relief of his symptoms. Plan to discharge patient home and symptomatically treatment. Advised patient to follow up with his ENT and PCP at his scheduled appointments. Discussed return precautions.  Final Clinical Impressions(s) / ED Diagnoses   Final diagnoses:  Constipation, unspecified constipation type    New Prescriptions Discharge Medication List as of 01/06/2016  3:45 PM    START taking these medications   Details  polyethylene glycol powder (GLYCOLAX/MIRALAX) powder Take 17 g by mouth 2 (two) times daily., Starting Mon 01/06/2016, Print         [address]Kody Vigil Elizabeth TroyNadeau, [place]PA-C 01/07/16 [id number]    Linwood DibblesJon Knapp, MD 01/09/16 1014

## 2016-01-06 NOTE — ED Notes (Addendum)
RN and tech attempted to help patient into bed. Pt. sts he cannot lay back because of the pain to his rectum.

## 2016-01-06 NOTE — ED Notes (Signed)
Pt. Able to have large bowel movement with relief. EDP made aware.

## 2016-03-13 ENCOUNTER — Emergency Department (HOSPITAL_COMMUNITY): Payer: Medicare Other

## 2016-03-13 ENCOUNTER — Encounter (HOSPITAL_COMMUNITY): Payer: Self-pay | Admitting: *Deleted

## 2016-03-13 ENCOUNTER — Emergency Department (HOSPITAL_COMMUNITY)
Admission: EM | Admit: 2016-03-13 | Discharge: 2016-03-13 | Disposition: A | Discharge status: Home / Self Care | Payer: Medicare Other | Attending: Emergency Medicine | Admitting: Emergency Medicine

## 2016-03-13 DIAGNOSIS — Z7984 Long term (current) use of oral hypoglycemic drugs: Secondary | ICD-10-CM | POA: Diagnosis not present

## 2016-03-13 DIAGNOSIS — Z79899 Other long term (current) drug therapy: Secondary | ICD-10-CM | POA: Insufficient documentation

## 2016-03-13 DIAGNOSIS — Z87891 Personal history of nicotine dependence: Secondary | ICD-10-CM | POA: Insufficient documentation

## 2016-03-13 DIAGNOSIS — R0602 Shortness of breath: Secondary | ICD-10-CM | POA: Diagnosis present

## 2016-03-13 DIAGNOSIS — Z7982 Long term (current) use of aspirin: Secondary | ICD-10-CM | POA: Insufficient documentation

## 2016-03-13 DIAGNOSIS — I10 Essential (primary) hypertension: Secondary | ICD-10-CM | POA: Diagnosis not present

## 2016-03-13 DIAGNOSIS — R0600 Dyspnea, unspecified: Secondary | ICD-10-CM | POA: Diagnosis not present

## 2016-03-13 DIAGNOSIS — E119 Type 2 diabetes mellitus without complications: Secondary | ICD-10-CM | POA: Diagnosis not present

## 2016-03-13 LAB — CBC WITH DIFFERENTIAL/PLATELET
BASOS ABS: 0 10*3/uL (ref 0.0–0.1)
BASOS PCT: 0 %
Eosinophils Absolute: 0.9 10*3/uL — ABNORMAL HIGH (ref 0.0–0.7)
Eosinophils Relative: 9 %
HEMATOCRIT: 39.1 % (ref 39.0–52.0)
Hemoglobin: 12.1 g/dL — ABNORMAL LOW (ref 13.0–17.0)
LYMPHS PCT: 11 %
Lymphs Abs: 1.1 10*3/uL (ref 0.7–4.0)
MCH: 28.7 pg (ref 26.0–34.0)
MCHC: 30.9 g/dL (ref 30.0–36.0)
MCV: 92.7 fL (ref 78.0–100.0)
MONO ABS: 0.5 10*3/uL (ref 0.1–1.0)
MONOS PCT: 5 %
NEUTROS ABS: 7.4 10*3/uL (ref 1.7–7.7)
NEUTROS PCT: 75 %
Platelets: 227 10*3/uL (ref 150–400)
RBC: 4.22 MIL/uL (ref 4.22–5.81)
RDW: 14.2 % (ref 11.5–15.5)
WBC: 9.9 10*3/uL (ref 4.0–10.5)

## 2016-03-13 LAB — BASIC METABOLIC PANEL
ANION GAP: 9 (ref 5–15)
BUN: 11 mg/dL (ref 6–20)
CO2: 29 mmol/L (ref 22–32)
Calcium: 9.3 mg/dL (ref 8.9–10.3)
Chloride: 100 mmol/L — ABNORMAL LOW (ref 101–111)
Creatinine, Ser: 0.71 mg/dL (ref 0.61–1.24)
GFR calc non Af Amer: >60 mL/min (ref >60)
GLUCOSE: 107 mg/dL — AB (ref 65–99)
Potassium: 4.1 mmol/L (ref 3.5–5.1)
Sodium: 138 mmol/L (ref 135–145)

## 2016-03-13 MED ORDER — SODIUM CHLORIDE 0.9 % IV SOLN: INTRAVENOUS | Status: DC

## 2016-03-13 MED ORDER — AMOXICILLIN-POT CLAVULANATE 875-125 MG PO TABS: 1.0000 | ORAL_TABLET | Freq: Two times a day (BID) | ORAL | 0 refills | Status: DC

## 2016-03-13 MED ORDER — IPRATROPIUM BROMIDE 0.02 % IN SOLN
0.5000 mg | Freq: Once | RESPIRATORY_TRACT | Status: AC
  Administered 2016-03-13: 0.5 mg via RESPIRATORY_TRACT
  Filled 2016-03-13: qty 2.5

## 2016-03-13 MED ORDER — ALBUTEROL SULFATE (2.5 MG/3ML) 0.083% IN NEBU
5.0000 mg | INHALATION_SOLUTION | Freq: Once | RESPIRATORY_TRACT | Status: AC
  Administered 2016-03-13: 5 mg via RESPIRATORY_TRACT
  Filled 2016-03-13: qty 6

## 2016-03-13 NOTE — ED Provider Notes (Signed)
[facility]PT Provider Note   CSN: [id number] Arrival date & time: 03/13/16  0850     History   Chief Complaint Chief Complaint  Patient presents with  . Shortness of Breath    HPI Dale Key is a 61 y.o. male.  61 year old male presents with shortness of breath which began this morning. Patient has a tracheostomy and still takes food by mouth and is concerned that he may have aspirated. He's been evaluated for a feeding tube. Has had a mild productive cough without fever or chills. No diarrhea. No chest discomfort. No recent leg swelling. Uses oxygen when necessary. Has noticed some increased secretions when he suctions himself. No treatment use prior to arrival      Past Medical History:  Diagnosis Date  . Diabetes mellitus without complication (HCC)   . Glaucoma   . Hyperlipidemia   . Hypertension     Patient Active Problem List   Diagnosis Date Noted  . Central cord syndrome [facility]) 10/13/2012    Past Surgical History:  Procedure Laterality Date  . ANTERIOR CERVICAL DECOMP/DISCECTOMY FUSION N/A 10/07/2012   Procedure: ANTERIOR CERVICAL DECOMPRESSION/DISCECTOMY FUSION 2 LEVELS;  Surgeon: Karn Cassis, MD;  Location: [facility] OR;  Service: Neurosurgery;  Laterality: N/A;  Anterior Cervical Three-Four/Four-Five Decompression and Fusion  . EYE SURGERY    . FOOT SURGERY Left    Pt states a tendon was removed to replace one in right hand.  Marland Kitchen HAND SURGERY Right   . TRACHEOSTOMY      OB History    No data available       Home Medications    Prior to Admission medications   Medication Sig Start Date End Date Taking? Authorizing Provider  aspirin 81 MG tablet Take 81 mg by mouth daily.    Historical Provider, MD  azithromycin (ZITHROMAX) 250 MG tablet Take 1 tablet (250 mg total) by mouth daily. Take first 2 tablets together, then 1 every day until finished. 09/27/15   Junius Finner, PA-C  furosemide (LASIX) 40 MG tablet Take 1 tablet (40 mg total) by mouth  daily. 10/20/12   Mcarthur Rossetti Angiulli, PA-C  ibuprofen (CHILD IBUPROFEN) 100 MG/5ML suspension Take 20 mLs (400 mg total) by mouth every 6 (six) hours as needed for fever, mild pain or moderate pain. 09/27/15   Junius Finner, PA-C  losartan (COZAAR) 100 MG tablet Take 500 mg by mouth every morning. Take 1/2 tablet 10/20/12   Mcarthur Rossetti Angiulli, PA-C  metFORMIN (GLUCOPHAGE) 1000 MG tablet Take 1 tablet (1,000 mg total) by mouth 2 (two) times daily with a meal. 10/20/12   Mcarthur Rossetti Angiulli, PA-C  metoprolol succinate (TOPROL-XL) 50 MG 24 hr tablet Take 1 tablet (50 mg total) by mouth every morning. Take with or immediately following a meal. 10/20/12   Mcarthur Rossetti Angiulli, PA-C  Multiple Vitamin (MULTIVITAMIN WITH MINERALS) TABS tablet Take 1 tablet by mouth every morning.    Historical Provider, MD  polyethylene glycol powder (GLYCOLAX/MIRALAX) powder Take 17 g by mouth 2 (two) times daily. 01/06/16   Everlene Farrier, PA-C  potassium chloride SA (K-DUR,KLOR-CON) 20 MEQ tablet Take 1 tablet (20 mEq total) by mouth daily as needed (For potassium loss.). 10/20/12   Mcarthur Rossetti Angiulli, PA-C  predniSONE (DELTASONE) 20 MG tablet 3 tabs po day one, then 2 po daily x 4 days 09/27/15   Junius Finner, PA-C  rosuvastatin (CRESTOR) 10 MG tablet Take 10 mg by mouth daily.    Historical Provider, MD  tamsulosin (  FLOMAX) 0.4 MG CAPS capsule Take 0.4 mg by mouth.    Historical Provider, MD    Family History No family history on file.  Social History Social History  Substance Use Topics  . Smoking status: Former Smoker    Quit date: 01/13/2012  . Smokeless tobacco: Never Used  . Alcohol use Yes     Allergies   Patient has no known allergies.   Review of Systems Review of Systems  All other systems reviewed and are negative.    Physical Exam Updated Vital Signs BP 151/76 (BP Location: Left Arm)   Pulse 92   Temp 98.8 F (37.1 C) (Oral)   SpO2 93%   Physical Exam  Constitutional: He is oriented to person,  place, and time. He appears well-developed and well-nourished.  Non-toxic appearance. No distress.  HENT:  Head: Normocephalic and atraumatic.  Eyes: Conjunctivae, EOM and lids are normal. Pupils are equal, round, and reactive to light.  Neck: Normal range of motion. Neck supple. No tracheal deviation present. No thyroid mass present.  Cardiovascular: Normal rate, regular rhythm and normal heart sounds.  Exam reveals no gallop.   No murmur heard. Pulmonary/Chest: Effort normal. No stridor. Tachypnea noted. No respiratory distress. He has decreased breath sounds in the right lower field and the left lower field. He has no wheezes. He has no rhonchi. He has no rales.  Abdominal: Soft. Normal appearance and bowel sounds are normal. He exhibits no distension. There is no tenderness. There is no rebound and no CVA tenderness.  Musculoskeletal: Normal range of motion. He exhibits no edema or tenderness.  Neurological: He is alert and oriented to person, place, and time. No cranial nerve deficit. GCS eye subscore is 4. GCS verbal subscore is 5. GCS motor subscore is 6.  Skin: Skin is warm and dry. No abrasion and no rash noted.  Psychiatric: He has a normal mood and affect. His speech is normal and behavior is normal.  Nursing note and vitals reviewed.    ED Treatments / Results  Labs (all labs ordered are listed, but only abnormal results are displayed) Labs Reviewed - No data to display  EKG  EKG Interpretation None       Radiology No results found.  Procedures Procedures (including critical care time)  Medications Ordered in ED Medications - No data to display   Initial Impression / Assessment and Plan / ED Course  I have reviewed the triage vital signs and the nursing notes.  Pertinent labs & imaging results that were available during my care of the patient were reviewed by me and considered in my medical decision making (see chart for details).     Patient given albuterol  treatment here and does feel better. They have has some element of bronchospasm. Will place on antibiotics for possible aspiration pneumonia. Return precautions given  Final Clinical Impressions(s) / ED Diagnoses   Final diagnoses:  None    New Prescriptions New Prescriptions   No medications on file     Lorre NickAnthony Naithan Delage, MD 03/13/16 1046

## 2016-03-13 NOTE — ED Triage Notes (Signed)
To ED with SOB for last couple of days. Pt is a trach pt who wears O2 via trach collar when needed. States he has been suctioning more than normal and thinks he may have aspirated.

## 2016-03-17 ENCOUNTER — Ambulatory Visit (INDEPENDENT_AMBULATORY_CARE_PROVIDER_SITE_OTHER): Payer: Medicare Other | Admitting: Podiatry

## 2016-03-17 ENCOUNTER — Encounter: Payer: Self-pay | Admitting: Podiatry

## 2016-03-17 DIAGNOSIS — B351 Tinea unguium: Secondary | ICD-10-CM | POA: Diagnosis not present

## 2016-03-17 DIAGNOSIS — M79676 Pain in unspecified toe(s): Secondary | ICD-10-CM | POA: Diagnosis not present

## 2016-03-17 NOTE — Patient Instructions (Signed)

## 2016-03-17 NOTE — Progress Notes (Signed)
Patient ID: Dale Key, male   DOB: May 24, 1955, 61 y.o.   MRN: [id number]   Subjective: This patient presents today complaining of thickened elongated toenails which are comfortable walking wearing shoes and request toenail debridement. He was last evaluated in our office on 07/10/2015 with a recommendation to return at three-month intervals for debridement, however, does not present until today for this service  Patient is diabetic and denies history of claudication, amputation or foot ulceration Patient has tracheotomy  Orientated 3  Vascular: Pitting edema bilaterally DP and PT pulses 2/4 bilaterally Capillary reflex immediate bilaterally  Neurological: Sensation to 10 g monofilament wire intact 5/5 right and 4/5 left Vibratory sensation intact bilaterally Ankle reflex equal and reactive bilaterally  Dermatological: No open skin lesions noted bilaterally Surgical scar dorsal aspect of left foot (patient relates a history of hand surgery and using tenderness and foot for these procedures) The toenails are extremely elongated, discolored, incurvated, hypertrophic with subungual debris and tender to direct palpation  Musculoskeletal: Pes planus bilaterally 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

## 2016-05-19 DIAGNOSIS — E042 Nontoxic multinodular goiter: Secondary | ICD-10-CM | POA: Insufficient documentation

## 2016-05-26 DIAGNOSIS — Z930 Tracheostomy status: Secondary | ICD-10-CM | POA: Insufficient documentation

## 2016-06-16 ENCOUNTER — Ambulatory Visit (INDEPENDENT_AMBULATORY_CARE_PROVIDER_SITE_OTHER): Payer: Medicare Other | Admitting: Podiatry

## 2016-06-16 DIAGNOSIS — B351 Tinea unguium: Secondary | ICD-10-CM

## 2016-06-16 DIAGNOSIS — M79676 Pain in unspecified toe(s): Secondary | ICD-10-CM

## 2016-06-16 NOTE — Patient Instructions (Signed)

## 2016-06-16 NOTE — Progress Notes (Signed)
Patient ID: Dale Key, male   DOB: [birth year], 61 y.o.   MRN: [id number]    Subjective: This patient presents today complaining of thickened elongated toenails which are comfortable walking wearing shoes and request toenail debridement. He was last evaluated in our office on 07/10/2015 with a recommendation to return at three-month intervals for debridement, however, does not present until today for this service  Patient is diabetic and denies history of claudication, amputation or foot ulceration Patient has tracheotomy  Patient also complains today of ongoing right ankle pain  Orientated 3  Vascular: Pitting edema bilaterally DP and PT pulses 2/4 bilaterally Capillary reflex immediate bilaterally  Neurological: Sensation to 10 g monofilament wire intact 5/5 right and 4/5 left Vibratory sensation intact bilaterally Ankle reflex equal and reactive bilaterally  Dermatological: No open skin lesions noted bilaterally Surgical scar dorsal aspect of left foot (patient relates a history of hand surgery and using tenderness and foot for these procedures) The toenails are extremely elongated, discolored, incurvated, hypertrophic with subungual debris and tender to direct palpation  Musculoskeletal: Pes planus bilaterally 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 Patient has a slow flat-footed gait using a cane Limited range of motion of the subtalar joints bilaterally Ankle joints dorsi flexion 90 without obvious crepitus or pain on range of motion  Assessment: Diabetic Satisfactory vascular status Protective sensation intact bilaterally Peripheral edema bilaterally 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 Logan BoresEvans to evaluate ongoing right ankle pain ASAP  Reappoint 3 months toenail  debridement

## 2016-07-08 ENCOUNTER — Ambulatory Visit: Payer: Medicare Other | Admitting: Podiatry

## 2016-08-28 ENCOUNTER — Telehealth (HOSPITAL_COMMUNITY): Payer: Self-pay

## 2016-08-28 NOTE — Telephone Encounter (Signed)
LMTCB regarding pulmonary rehab referral

## 2016-09-15 ENCOUNTER — Encounter (INDEPENDENT_AMBULATORY_CARE_PROVIDER_SITE_OTHER): Payer: Medicare Other | Admitting: Podiatry

## 2016-09-15 ENCOUNTER — Telehealth: Payer: Self-pay | Admitting: Podiatry

## 2016-09-15 NOTE — Progress Notes (Signed)
This encounter was created in error - please disregard.

## 2016-09-15 NOTE — Telephone Encounter (Signed)
Left voicemail for patient to call back and reschedule his missed appointment from 09/15/16.

## 2016-09-18 ENCOUNTER — Encounter (HOSPITAL_COMMUNITY): Payer: Self-pay | Admitting: *Deleted

## 2016-09-18 ENCOUNTER — Emergency Department (HOSPITAL_COMMUNITY)
Admission: EM | Admit: 2016-09-18 | Discharge: 2016-09-18 | Disposition: A | Discharge status: Home / Self Care | Payer: Medicare Other | Attending: Emergency Medicine | Admitting: Emergency Medicine

## 2016-09-18 ENCOUNTER — Emergency Department (HOSPITAL_COMMUNITY): Payer: Medicare Other

## 2016-09-18 DIAGNOSIS — Z7982 Long term (current) use of aspirin: Secondary | ICD-10-CM | POA: Diagnosis not present

## 2016-09-18 DIAGNOSIS — E119 Type 2 diabetes mellitus without complications: Secondary | ICD-10-CM | POA: Diagnosis not present

## 2016-09-18 DIAGNOSIS — Z87891 Personal history of nicotine dependence: Secondary | ICD-10-CM | POA: Diagnosis not present

## 2016-09-18 DIAGNOSIS — J9509 Other tracheostomy complication: Secondary | ICD-10-CM

## 2016-09-18 DIAGNOSIS — Z7984 Long term (current) use of oral hypoglycemic drugs: Secondary | ICD-10-CM | POA: Diagnosis not present

## 2016-09-18 DIAGNOSIS — Z79899 Other long term (current) drug therapy: Secondary | ICD-10-CM | POA: Insufficient documentation

## 2016-09-18 DIAGNOSIS — J9503 Malfunction of tracheostomy stoma: Secondary | ICD-10-CM | POA: Insufficient documentation

## 2016-09-18 DIAGNOSIS — R0602 Shortness of breath: Secondary | ICD-10-CM | POA: Diagnosis present

## 2016-09-18 DIAGNOSIS — I10 Essential (primary) hypertension: Secondary | ICD-10-CM | POA: Insufficient documentation

## 2016-09-18 HISTORY — DX: Tracheostomy status: Z93.0

## 2016-09-18 LAB — BASIC METABOLIC PANEL
Anion gap: 8 (ref 5–15)
BUN: 11 mg/dL (ref 6–20)
CALCIUM: 8.9 mg/dL (ref 8.9–10.3)
CO2: 33 mmol/L — ABNORMAL HIGH (ref 22–32)
CREATININE: 0.81 mg/dL (ref 0.61–1.24)
Chloride: 98 mmol/L — ABNORMAL LOW (ref 101–111)
Glucose, Bld: 97 mg/dL (ref 65–99)
Potassium: 3.5 mmol/L (ref 3.5–5.1)
Sodium: 139 mmol/L (ref 135–145)

## 2016-09-18 LAB — CBC
HCT: 34.7 % — ABNORMAL LOW (ref 39.0–52.0)
Hemoglobin: 10.5 g/dL — ABNORMAL LOW (ref 13.0–17.0)
MCH: 28.7 pg (ref 26.0–34.0)
MCHC: 30.3 g/dL (ref 30.0–36.0)
MCV: 94.8 fL (ref 78.0–100.0)
PLATELETS: 248 10*3/uL (ref 150–400)
RBC: 3.66 MIL/uL — AB (ref 4.22–5.81)
RDW: 13.9 % (ref 11.5–15.5)
WBC: 5.8 10*3/uL (ref 4.0–10.5)

## 2016-09-18 LAB — I-STAT TROPONIN, ED: TROPONIN I, POC: 0 ng/mL (ref 0.00–0.08)

## 2016-09-18 NOTE — ED Triage Notes (Signed)
Pt endorses shob since yesterday. Pt has trach and states "I think something is wrong with it" Clear and equal breath sounds and spo2 99% on RA. VSS. Pt appears to be slightly shob.

## 2016-09-18 NOTE — ED Notes (Signed)
RT in triage with pt. Dale Key does not appear to be dislodge. RT thinks that pt has dried secretions disabling him from advancing his trach catheter.

## 2016-09-18 NOTE — ED Provider Notes (Signed)
[facility]-EMERGENCY DEPT Provider Note   CSN: [id number] Arrival date & time: 09/18/16  1541     History   Chief Complaint Chief Complaint  Patient presents with  . Shortness of Breath    HPI Dale Key is a 61 y.o. male.  HPI Pt presents to the ED with complaints of shortness of breath and a feeling that something is clogging his trach.  He is not able to get the inner cannula in.  He has noticed increasing mucus and secretions.  Janina Mayo was placed in July.  NO fever.  NO blood in the stool.  No chest pain.    Past Medical History:  Diagnosis Date  . Diabetes mellitus without complication (HCC)   . Glaucoma   . Hyperlipidemia   . Hypertension   . 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 Central cord syndrome [facility]) 10/13/2012    Past Surgical History:  Procedure Laterality Date  . ANTERIOR CERVICAL DECOMP/DISCECTOMY FUSION N/A 10/07/2012   Procedure: ANTERIOR CERVICAL DECOMPRESSION/DISCECTOMY FUSION 2 LEVELS;  Surgeon: Karn Cassis, MD;  Location: [facility] OR;  Service: Neurosurgery;  Laterality: N/A;  Anterior Cervical Three-Four/Four-Five Decompression and Fusion  . EYE SURGERY    . FOOT SURGERY Left    Pt states a tendon was removed to replace one in right hand.  Marland Kitchen HAND SURGERY Right   . TRACHEOSTOMY      OB History    No data available       Home Medications    Prior to Admission medications   Medication Sig Start Date End Date Taking? Authorizing Provider  aspirin 81 MG tablet Take 81 mg by mouth daily.   Yes [provider]  Cholecalciferol (VITAMIN D-1000 MAX ST) 1000 units tablet Take 1,000 Units by mouth daily.   Yes [provider]  doxazosin (CARDURA) 4 MG tablet Take 4 mg by mouth at bedtime. 12/20/15  Yes [provider]  furosemide (LASIX) 40 MG tablet Take 1 tablet (40 mg total) by mouth daily. 10/20/12  Yes Angiulli, Mcarthur Rossetti, PA-C  linaclotide (LINZESS) 145 MCG CAPS capsule Take  145 mcg by mouth daily.   Yes [provider]  losartan (COZAAR) 100 MG tablet Take 50 mg by mouth every morning.  10/20/12  Yes Angiulli, Mcarthur Rossetti, PA-C  metFORMIN (GLUCOPHAGE) 1000 MG tablet Take 1 tablet (1,000 mg total) by mouth 2 (two) times daily with a meal. 10/20/12  Yes Angiulli, Mcarthur Rossetti, PA-C  metoprolol succinate (TOPROL-XL) 50 MG 24 hr tablet Take 1 tablet (50 mg total) by mouth every morning. Take with or immediately following a meal. 10/20/12  Yes Angiulli, Mcarthur Rossetti, PA-C  potassium chloride SA (K-DUR,KLOR-CON) 20 MEQ tablet Take 1 tablet (20 mEq total) by mouth daily as needed (For potassium loss.). 10/20/12  Yes Angiulli, Mcarthur Rossetti, PA-C  rosuvastatin (CRESTOR) 10 MG tablet Take 10 mg by mouth daily.   Yes [provider]  amoxicillin-clavulanate (AUGMENTIN) 875-125 MG tablet Take 1 tablet by mouth 2 (two) times daily. Patient not taking: Reported on 09/18/2016 03/13/16   Lorre Nick, MD  azithromycin (ZITHROMAX) 250 MG tablet Take 1 tablet (250 mg total) by mouth daily. Take first 2 tablets together, then 1 every day until finished. Patient not taking: Reported on 09/18/2016 09/27/15   Lurene Shadow, PA-C  ibuprofen (CHILD IBUPROFEN) 100 MG/5ML suspension Take 20 mLs (400 mg total) by mouth every 6 (six) hours as needed for  fever, mild pain or moderate pain. Patient not taking: Reported on 09/18/2016 09/27/15   Lurene ShadowPhelps, Erin O, PA-C  polyethylene glycol powder (GLYCOLAX/MIRALAX) powder Take 17 g by mouth 2 (two) times daily. Patient not taking: Reported on 09/18/2016 01/06/16   Everlene Farrieransie, William, PA-C  predniSONE (DELTASONE) 20 MG tablet 3 tabs po day one, then 2 po daily x 4 days Patient not taking: Reported on 09/18/2016 09/27/15   Rolla PlatePhelps, Erin O, PA-C    Family History History reviewed. No pertinent family history.  Social History Social History  Substance Use Topics  . Smoking status: Former Smoker    Quit date: 01/13/2012  . Smokeless tobacco: Never Used  . Alcohol use  Yes     Allergies   Patient has no known allergies.   Review of Systems Review of Systems  All other systems reviewed and are negative.    Physical Exam Updated Vital Signs BP 130/71   Pulse 62   Temp 98.2 F (36.8 C) (Oral)   Resp (!) 21   SpO2 97%   Physical Exam  Constitutional: He appears well-developed and well-nourished. No distress.  HENT:  Head: Normocephalic and atraumatic.  Right Ear: External ear normal.  Left Ear: External ear normal.  Crusted mucus around trach site,    Eyes: Conjunctivae are normal. Right eye exhibits no discharge. Left eye exhibits no discharge. No scleral icterus.  Neck: Neck supple. No tracheal deviation present.  Cardiovascular: Normal rate, regular rhythm and intact distal pulses.   Pulmonary/Chest: Effort normal and breath sounds normal. No stridor. No respiratory distress. He has no wheezes. He has no rales.  Able to speak in full sentences  Abdominal: Soft. Bowel sounds are normal. He exhibits no distension. There is no tenderness. There is no rebound and no guarding.  Musculoskeletal: He exhibits edema. He exhibits no tenderness.  Neurological: He is alert. He has normal strength. No cranial nerve deficit (no facial droop, extraocular movements intact, no slurred speech) or sensory deficit. He exhibits normal muscle tone. He displays no seizure activity. Coordination normal.  Skin: Skin is warm and dry. No rash noted.  Psychiatric: He has a normal mood and affect.  Nursing note and vitals reviewed.    ED Treatments / Results  Labs (all labs ordered are listed, but only abnormal results are displayed) Labs Reviewed  BASIC METABOLIC PANEL - Abnormal; Notable for the following:       Result Value   Chloride 98 (*)    CO2 33 (*)    All other components within normal limits  CBC - Abnormal; Notable for the following:    RBC 3.66 (*)    Hemoglobin 10.5 (*)    HCT 34.7 (*)    All other components within normal limits  I-STAT  TROPONIN, ED    EKG  EKG Interpretation  Date/Time:  Friday September 18 2016 16:03:03 EDT Ventricular Rate:  62 PR Interval:  200 QRS Duration: 150 QT Interval:  416 QTC Calculation: 422 R Axis:   -83 Text Interpretation:  Normal sinus rhythm Right bundle branch block Left anterior fascicular block ** Bifascicular block ** Left ventricular hypertrophy with QRS widening Abnormal ECG No significant change since last tracing Confirmed by Linwood DibblesKnapp, Karmin Kasprzak [phone]([id number]) on 09/18/2016 6:58:27 PM       Radiology Dg Chest 2 View  Result Date: 09/18/2016 CLINICAL DATA:  Shortness of breath EXAM: CHEST  2 VIEW COMPARISON:  03/13/2016 FINDINGS: Elevation of the left diaphragm. Mild basilar atelectasis on the left. No  large effusion. Stable cardiomediastinal silhouette. No pneumothorax. Small right lower peripheral calcified nodule. Degenerative changes of the spine. IMPRESSION: 1. Stable elevation of left diaphragm with mild basilar atelectasis 2. No acute consolidation 3. Stable mild cardiomegaly Electronically Signed   By: Jasmine Pang M.D.   On: 09/18/2016 16:41    Procedures TRACHEOSTOMY REPLACEMENT Date/Time: 09/18/2016 8:57 PM Performed by: Linwood Dibbles Authorized by: Linwood Dibbles  Consent: Verbal consent obtained. Risks and benefits: risks, benefits and alternatives were discussed Consent given by: patient Patient understanding: patient states understanding of the procedure being performed Patient identity confirmed: verbally with patient Time out: Immediately prior to procedure a "time out" was called to verify the correct patient, procedure, equipment, support staff and site/side marked as required. Indications: obstruction Local anesthesia used: no  Anesthesia: Local anesthesia used: no  Sedation: Patient sedated: no Tube cuff: cuffless Tube size: 6.0 mm Patient tolerance: Patient tolerated the procedure well with no immediate complications Comments: Old trach tube removed.  Large amount of  debris in the tube.  New tube replaced without difficulty.  Pt is breathing easily.    (including critical care time)  Medications Ordered in ED Medications - No data to display   Initial Impression / Assessment and Plan / ED Course  I have reviewed the triage vital signs and the nursing notes.  Pertinent labs & imaging results that were available during my care of the patient were reviewed by me and considered in my medical decision making (see chart for details).   patient presented to the emergency room with dyspnea associated with an obstructed tracheostomy tube. Suction was attempted initially but there was no improvement. Patient had his tracheostomy tube replaced byme. He is feeling much better. Stable for discharge.  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

## 2016-09-18 NOTE — Discharge Instructions (Signed)
Follow up with your ENT, pulmonary doctor.  Return as needed for worsening symptoms.

## 2016-09-18 NOTE — Progress Notes (Signed)
RT suctioned patient, catheter passed with ease. RT attempted to place inner cannula within trach without success. Janina Mayorach does not seem to be dislodged.  RN aware. RT suggested MD look at patient.  RT will continue to monitor.

## 2016-11-28 ENCOUNTER — Encounter (HOSPITAL_COMMUNITY): Payer: Self-pay | Admitting: Emergency Medicine

## 2016-11-28 ENCOUNTER — Emergency Department (HOSPITAL_COMMUNITY)
Admission: EM | Admit: 2016-11-28 | Discharge: 2016-11-28 | Disposition: A | Discharge status: Home / Self Care | Payer: Medicare Other | Attending: Physician Assistant | Admitting: Physician Assistant

## 2016-11-28 ENCOUNTER — Emergency Department (HOSPITAL_COMMUNITY): Payer: Medicare Other

## 2016-11-28 ENCOUNTER — Other Ambulatory Visit: Payer: Self-pay

## 2016-11-28 DIAGNOSIS — Z7982 Long term (current) use of aspirin: Secondary | ICD-10-CM | POA: Insufficient documentation

## 2016-11-28 DIAGNOSIS — R0602 Shortness of breath: Secondary | ICD-10-CM | POA: Diagnosis not present

## 2016-11-28 DIAGNOSIS — Z79899 Other long term (current) drug therapy: Secondary | ICD-10-CM | POA: Diagnosis not present

## 2016-11-28 DIAGNOSIS — J9509 Other tracheostomy complication: Secondary | ICD-10-CM | POA: Diagnosis not present

## 2016-11-28 DIAGNOSIS — E119 Type 2 diabetes mellitus without complications: Secondary | ICD-10-CM | POA: Insufficient documentation

## 2016-11-28 DIAGNOSIS — Z87891 Personal history of nicotine dependence: Secondary | ICD-10-CM | POA: Insufficient documentation

## 2016-11-28 DIAGNOSIS — J9503 Malfunction of tracheostomy stoma: Secondary | ICD-10-CM | POA: Diagnosis present

## 2016-11-28 DIAGNOSIS — I10 Essential (primary) hypertension: Secondary | ICD-10-CM | POA: Diagnosis not present

## 2016-11-28 DIAGNOSIS — Z7984 Long term (current) use of oral hypoglycemic drugs: Secondary | ICD-10-CM | POA: Diagnosis not present

## 2016-11-28 LAB — BASIC METABOLIC PANEL
Anion gap: 9 (ref 5–15)
BUN: 17 mg/dL (ref 6–20)
CALCIUM: 9.2 mg/dL (ref 8.9–10.3)
CHLORIDE: 99 mmol/L — AB (ref 101–111)
CO2: 29 mmol/L (ref 22–32)
CREATININE: 0.82 mg/dL (ref 0.61–1.24)
GFR calc non Af Amer: >60 mL/min (ref >60)
Glucose, Bld: 111 mg/dL — ABNORMAL HIGH (ref 65–99)
Potassium: 3.4 mmol/L — ABNORMAL LOW (ref 3.5–5.1)
SODIUM: 137 mmol/L (ref 135–145)

## 2016-11-28 LAB — CBC WITH DIFFERENTIAL/PLATELET
BASOS PCT: 1 %
Basophils Absolute: 0 10*3/uL (ref 0.0–0.1)
EOS ABS: 0.2 10*3/uL (ref 0.0–0.7)
EOS PCT: 2 %
HCT: 36.1 % — ABNORMAL LOW (ref 39.0–52.0)
HEMOGLOBIN: 11.3 g/dL — AB (ref 13.0–17.0)
LYMPHS ABS: 1.6 10*3/uL (ref 0.7–4.0)
Lymphocytes Relative: 22 %
MCH: 30.1 pg (ref 26.0–34.0)
MCHC: 31.3 g/dL (ref 30.0–36.0)
MCV: 96 fL (ref 78.0–100.0)
MONOS PCT: 9 %
Monocytes Absolute: 0.6 10*3/uL (ref 0.1–1.0)
NEUTROS PCT: 66 %
Neutro Abs: 4.8 10*3/uL (ref 1.7–7.7)
PLATELETS: 214 10*3/uL (ref 150–400)
RBC: 3.76 MIL/uL — ABNORMAL LOW (ref 4.22–5.81)
RDW: 13.9 % (ref 11.5–15.5)
WBC: 7.2 10*3/uL (ref 4.0–10.5)

## 2016-11-28 LAB — I-STAT TROPONIN, ED: TROPONIN I, POC: 0 ng/mL (ref 0.00–0.08)

## 2016-11-28 MED ORDER — ALBUTEROL SULFATE (2.5 MG/3ML) 0.083% IN NEBU
5.0000 mg | INHALATION_SOLUTION | Freq: Once | RESPIRATORY_TRACT | Status: AC
  Administered 2016-11-28: 5 mg via RESPIRATORY_TRACT
  Filled 2016-11-28: qty 6

## 2016-11-28 NOTE — ED Triage Notes (Signed)
Pt states, "I can't get the inner canula changed and I can't wait til Monday." Pt reports SOB, cough.  Pt reports needing 60 XLT TUP, cuffless. Resp even, unlabored, wheezing.

## 2016-11-28 NOTE — ED Provider Notes (Signed)
[facility] EMERGENCY DEPARTMENT Provider Note   CSN: [id number] Arrival date & time: 11/28/16  1424     History   Chief Complaint Chief Complaint  Patient presents with  . Tracheostomy Tube Change    HPI Dale Key is a 61 y.o. male.  HPI   Patient is a 61-year male with a history of tracheostomy diabetes mellitus, hypertension, hyperlipidemia presenting for trach obstruction for 2 days.  Janina Mayo has been cannulated since July 2017.  Patient reports that he pulled the inner cannula out and it did not recannulate.  Patient reports that he suctions periodically when he feels that he needs to does not use humidifier at night.  Patient reports that this happened before.  Patient ports increased production of purulent sputum out of his trach.  No shortness of breath.  Patient had a brief episode of some substernal chest discomfort on his way to the emergency department but this is resolved.  Patient reports that all symptoms resolved when he got a breathing treatment in the emergency department prior to evaluation.  No fevers, chills, nausea, vomiting, increased lower extremity edema.  Patient has a follow-up appt with his ENT physician on Monday.  Past Medical History:  Diagnosis Date  . Diabetes mellitus without complication (HCC)   . Glaucoma   . Hyperlipidemia   . Hypertension   . 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 Central cord syndrome [facility]) 10/13/2012    Past Surgical History:  Procedure Laterality Date  . ANTERIOR CERVICAL DECOMPRESSION/DISCECTOMY FUSION 2 LEVELS N/A 10/07/2012   Performed by Karn Cassis, MD at [facility] OR  . EYE SURGERY    . FOOT SURGERY Left    Pt states a tendon was removed to replace one in right hand.  Marland Kitchen HAND SURGERY Right   . TRACHEOSTOMY      OB History    No data available       Home Medications    Prior to Admission medications   Medication Sig Start Date End Date  Taking? Authorizing Provider  amoxicillin-clavulanate (AUGMENTIN) 875-125 MG tablet Take 1 tablet by mouth 2 (two) times daily. Patient not taking: Reported on 09/18/2016 03/13/16   Lorre Nick, MD  aspirin 81 MG tablet Take 81 mg by mouth daily.    [provider]  azithromycin (ZITHROMAX) 250 MG tablet Take 1 tablet (250 mg total) by mouth daily. Take first 2 tablets together, then 1 every day until finished. Patient not taking: Reported on 09/18/2016 09/27/15   Lurene Shadow, PA-C  Cholecalciferol (VITAMIN D-1000 MAX ST) 1000 units tablet Take 1,000 Units by mouth daily.    [provider]  doxazosin (CARDURA) 4 MG tablet Take 4 mg by mouth at bedtime. 12/20/15   [provider]  furosemide (LASIX) 40 MG tablet Take 1 tablet (40 mg total) by mouth daily. 10/20/12   Angiulli, Mcarthur Rossetti, PA-C  ibuprofen (CHILD IBUPROFEN) 100 MG/5ML suspension Take 20 mLs (400 mg total) by mouth every 6 (six) hours as needed for fever, mild pain or moderate pain. Patient not taking: Reported on 09/18/2016 09/27/15   Lurene Shadow, PA-C  linaclotide [facility]) 145 MCG CAPS capsule Take 145 mcg by mouth daily.    [provider]  losartan (COZAAR) 100 MG tablet Take 50 mg by mouth every morning.  10/20/12   Angiulli, Mcarthur Rossetti, PA-C  metFORMIN (GLUCOPHAGE) 1000 MG tablet Take 1 tablet (1,000  mg total) by mouth 2 (two) times daily with a meal. 10/20/12   Angiulli, Mcarthur Rossettianiel J, PA-C  metoprolol succinate (TOPROL-XL) 50 MG 24 hr tablet Take 1 tablet (50 mg total) by mouth every morning. Take with or immediately following a meal. 10/20/12   Angiulli, Mcarthur Rossettianiel J, PA-C  polyethylene glycol powder (GLYCOLAX/MIRALAX) powder Take 17 g by mouth 2 (two) times daily. Patient not taking: Reported on 09/18/2016 01/06/16   Everlene Farrieransie, William, PA-C  potassium chloride SA (K-DUR,KLOR-CON) 20 MEQ tablet Take 1 tablet (20 mEq total) by mouth daily as needed (For potassium loss.). 10/20/12   Angiulli, Mcarthur Rossettianiel J, PA-C    predniSONE (DELTASONE) 20 MG tablet 3 tabs po day one, then 2 po daily x 4 days Patient not taking: Reported on 09/18/2016 09/27/15   Lurene ShadowPhelps, Erin O, PA-C  rosuvastatin (CRESTOR) 10 MG tablet Take 10 mg by mouth daily.    [provider]    Family History No family history on file.  Social History Social History   Tobacco Use  . Smoking status: Former Smoker    Last attempt to quit: 01/13/2012    Years since quitting: 4.8  . Smokeless tobacco: Never Used  Substance Use Topics  . Alcohol use: Yes  . Drug use: No     Allergies   Patient has no known allergies.   Review of Systems Review of Systems  Constitutional: Negative for chills and fever.  HENT: Negative for congestion and rhinorrhea.   Eyes: Negative for visual disturbance.  Respiratory: Positive for cough and shortness of breath. Negative for chest tightness.   Cardiovascular: Negative for chest pain and leg swelling.  Gastrointestinal: Negative for abdominal pain, nausea and vomiting.  Neurological: Negative for light-headedness.  All other systems reviewed and are negative.    Physical Exam Updated Vital Signs BP 128/68 (BP Location: Right Arm)   Pulse 82   Temp 98.9 F (37.2 C) (Oral)   Resp 16   Ht 5' 9.5" (1.765 m)   Wt 111.1 kg (245 lb)   SpO2 98%   BMI 35.66 kg/m   Physical Exam  Constitutional: He appears well-developed and well-nourished. No distress.  HENT:  Head: Normocephalic and atraumatic.  Mouth/Throat: Oropharynx is clear and moist.  Stoma patent.  There are crusted purulent secretions around the stoma.  Eyes: Conjunctivae and EOM are normal. Pupils are equal, round, and reactive to light.  Neck: Normal range of motion. Neck supple.  Cardiovascular: Normal rate, regular rhythm, S1 normal and S2 normal.  No murmur heard. Pulmonary/Chest: Effort normal. He has wheezes. He has no rales.  Abdominal: Soft. He exhibits no distension. There is no tenderness. There is no guarding.   Musculoskeletal: Normal range of motion. He exhibits no edema or deformity.  Lymphadenopathy:    He has no cervical adenopathy.  Neurological: He is alert.  Cranial nerves grossly intact. Patient was extremities symmetrically and with good coordination.  Skin: Skin is warm and dry. No rash noted. No erythema.  Psychiatric: He has a normal mood and affect. His behavior is normal. Judgment and thought content normal.  Nursing note and vitals reviewed.    ED Treatments / Results  Labs (all labs ordered are listed, but only abnormal results are displayed) Labs Reviewed  BASIC METABOLIC PANEL - Abnormal; Notable for the following components:      Result Value   Potassium 3.4 (*)    Chloride 99 (*)    Glucose, Bld 111 (*)    All other components  within normal limits  CBC WITH DIFFERENTIAL/PLATELET - Abnormal; Notable for the following components:   RBC 3.76 (*)    Hemoglobin 11.3 (*)    HCT 36.1 (*)    All other components within normal limits  I-STAT TROPONIN, ED    EKG  EKG Interpretation  Date/Time:  Saturday November 28 2016 15:18:10 EST Ventricular Rate:  82 PR Interval:  216 QRS Duration: 128 QT Interval:  378 QTC Calculation: 441 R Axis:   -72 Text Interpretation:  No significant change since last tracing Confirmed by Bary CastillaMackuen, Courteney ([id number]) on 11/28/2016 7:10:39 PM       Radiology Dg Chest 2 View  Result Date: 11/28/2016 CLINICAL DATA:  Shortness of breath. EXAM: CHEST  2 VIEW COMPARISON:  Chest x-ray dated 09/18/2016. FINDINGS: Tracheostomy tube appears appropriate in position in the midline. Heart size and mediastinal contours are stable. Lungs are clear. There is stable elevation of the left hemidiaphragm. Osseous and soft tissue structures about the chest are otherwise unremarkable. IMPRESSION: 1. No acute findings.  No evidence of pneumonia or pulmonary edema. 2. Tracheostomy tube appears appropriately positioned in the midline, and grossly stable in  position compared to previous exams. Electronically Signed   By: Bary RichardStan  Maynard M.D.   On: 11/28/2016 16:14    Procedures Procedures (including critical care time)  Medications Ordered in ED Medications  albuterol (PROVENTIL) (2.5 MG/3ML) 0.083% nebulizer solution 5 mg (5 mg Nebulization Given 11/28/16 1517)     Initial Impression / Assessment and Plan / ED Course  I have reviewed the triage vital signs and the nursing notes.  Pertinent labs & imaging results that were available during my care of the patient were reviewed by me and considered in my medical decision making (see chart for details).      Final Clinical Impressions(s) / ED Diagnoses   Final diagnoses:  Other tracheostomy complication (HCC)   Patient is well-appearing and in no acute distress.  Oxygen saturation 98% on room air.  Labs demonstrate stable anemia.  Troponin negative.  EKG without ischemia, infarction, or arrhythmia.  Chest x-ray without acute  cardiopulmonary abnormality.  Janina Mayorach was changed at bedside with respiratory therapy.  Demonstrated end-tidal CO2 for appropriate placement.  Patient was instructed on proper trach care.  Patient is following up with his ENT physician on Monday.  Patient given return precautions for any chest pain, shortness breath, or re-obstruction.  Patient is in understanding and agrees with the plan of care.  ED Discharge Orders    None       Delia ChimesMurray, Caria Transue B, PA-C 11/28/16 1948    Abelino DerrickMackuen, Courteney Lyn, MD 11/29/16 2308

## 2016-11-28 NOTE — Discharge Instructions (Signed)
Please see your ENT physician per your previously scheduled appointment.  Please return the emergency department for any chest pain, shortness of breath, increasing secretions, or tracheostomy obstruction.

## 2016-11-28 NOTE — ED Notes (Signed)
ED Provider at bedside. 

## 2016-11-28 NOTE — Progress Notes (Signed)
RT assessed patient in triage. Pt in no distress. RT attempted to pass 5210fr suction catheter and was successful, minimal secretions, but inner diameter was noted to have dried secretions which stuck to the suction catheter. Pt attempted to put in inner cannula again without success. RT sent order for to materials to order a new 6.0 shilet XLT proximal trach. RT will change the trach upon arrival of supplies.

## 2016-11-28 NOTE — Progress Notes (Signed)
Trached changed by this RT and Gregery NaNikki Nelson RRT. Pt tol very well. Changed to same size trach #6 XLT proximal. Positive color change on ETCO2. No distress noted. Extra inner cannulas given to patient and instructions to wear humidity at least at night to prevent the trach from being clogged, along with suction as needed. Pt stated he understands.

## 2016-12-06 ENCOUNTER — Encounter (HOSPITAL_COMMUNITY): Payer: Self-pay

## 2016-12-06 ENCOUNTER — Emergency Department (HOSPITAL_COMMUNITY)
Admission: EM | Admit: 2016-12-06 | Discharge: 2016-12-07 | Disposition: A | Discharge status: Home / Self Care | Payer: Medicare Other | Attending: Emergency Medicine | Admitting: Emergency Medicine

## 2016-12-06 ENCOUNTER — Emergency Department (HOSPITAL_COMMUNITY): Payer: Medicare Other

## 2016-12-06 DIAGNOSIS — E119 Type 2 diabetes mellitus without complications: Secondary | ICD-10-CM | POA: Diagnosis not present

## 2016-12-06 DIAGNOSIS — Z7984 Long term (current) use of oral hypoglycemic drugs: Secondary | ICD-10-CM | POA: Insufficient documentation

## 2016-12-06 DIAGNOSIS — I10 Essential (primary) hypertension: Secondary | ICD-10-CM | POA: Insufficient documentation

## 2016-12-06 DIAGNOSIS — Z87891 Personal history of nicotine dependence: Secondary | ICD-10-CM | POA: Insufficient documentation

## 2016-12-06 DIAGNOSIS — Z79899 Other long term (current) drug therapy: Secondary | ICD-10-CM | POA: Insufficient documentation

## 2016-12-06 DIAGNOSIS — Z7982 Long term (current) use of aspirin: Secondary | ICD-10-CM | POA: Insufficient documentation

## 2016-12-06 DIAGNOSIS — R0789 Other chest pain: Secondary | ICD-10-CM | POA: Diagnosis not present

## 2016-12-06 LAB — I-STAT TROPONIN, ED: TROPONIN I, POC: 0 ng/mL (ref 0.00–0.08)

## 2016-12-06 NOTE — ED Triage Notes (Signed)
Pt complains of right sided chest pain for 45 minutes, pt has a trach that sounds very junky

## 2016-12-06 NOTE — ED Notes (Signed)
Pt did not want to get up into bed. Pt wants to sit on side of the bed.

## 2016-12-06 NOTE — ED Provider Notes (Signed)
[facility]-EMERGENCY DEPT Provider Note   CSN: [id number] Arrival date & time: 12/06/16  2114     History   Chief Complaint Chief Complaint  Patient presents with  . Chest Pain    HPI Dale Key is a 61 y.o. male with PMHx HTN, HLD, DM, tracheostomy dependence, presenting to ED with 45 minutes of right sided sharp chest pain that is waxing and waning. He states he had a similar episode last week as well. States he took 2 aspirin prior to arrival. Reports his pain is improving. Pain is worse with expiration and lifting his chin up. No increase sputum production from tracheostomy, stating it is actually improving since last visit on 11/28/16. Denies nausea, diaphoresis, cough, fever, SOB, abdominal complaints. No cardiac hx. Reports cardiac stress test this summer was normal. Has been taking medications as prescribed.   The history is provided by the patient.    Past Medical History:  Diagnosis Date  . Diabetes mellitus without complication (HCC)   . Glaucoma   . Hyperlipidemia   . Hypertension   . Tracheostomy present [facility](HCC)     Patient Active Problem List   Diagnosis Date Noted  . Central cord syndrome [facility](HCC) 10/13/2012    Past Surgical History:  Procedure Laterality Date  . ANTERIOR CERVICAL DECOMP/DISCECTOMY FUSION N/A 10/07/2012   Procedure: ANTERIOR CERVICAL DECOMPRESSION/DISCECTOMY FUSION 2 LEVELS;  Surgeon: Karn CassisErnesto M Botero, MD;  [facility];  Service: Neurosurgery;  Laterality: N/A;  Anterior Cervical Three-Four/Four-Five Decompression and Fusion  . EYE SURGERY    . FOOT SURGERY Left    Pt states a tendon was removed to replace one in right hand.  Marland Kitchen. HAND SURGERY Right   . TRACHEOSTOMY      OB History    No data available       Home Medications    Prior to Admission medications   Medication Sig Start Date End Date Taking? Authorizing Provider  amoxicillin-clavulanate (AUGMENTIN) 875-125 MG tablet Take 1 tablet by mouth 2 (two)  times daily. Patient not taking: Reported on 09/18/2016 03/13/16   Lorre NickAllen, Anthony, MD  aspirin 81 MG tablet Take 81 mg by mouth daily.    [provider]  azithromycin (ZITHROMAX) 250 MG tablet Take 1 tablet (250 mg total) by mouth daily. Take first 2 tablets together, then 1 every day until finished. Patient not taking: Reported on 09/18/2016 09/27/15   Lurene ShadowPhelps, Erin O, PA-C  Cholecalciferol (VITAMIN D-1000 MAX ST) 1000 units tablet Take 1,000 Units by mouth daily.    [provider]  doxazosin (CARDURA) 4 MG tablet Take 4 mg by mouth at bedtime. 12/20/15   [provider]  furosemide (LASIX) 40 MG tablet Take 1 tablet (40 mg total) by mouth daily. 10/20/12   Angiulli, Mcarthur Rossettianiel J, PA-C  ibuprofen (CHILD IBUPROFEN) 100 MG/5ML suspension Take 20 mLs (400 mg total) by mouth every 6 (six) hours as needed for fever, mild pain or moderate pain. Patient not taking: Reported on 09/18/2016 09/27/15   Lurene ShadowPhelps, Erin O, PA-C  linaclotide [facility](LINZESS) 145 MCG CAPS capsule Take 145 mcg by mouth daily.    [provider]  losartan (COZAAR) 100 MG tablet Take 50 mg by mouth every morning.  10/20/12   Angiulli, Mcarthur Rossettianiel J, PA-C  metFORMIN (GLUCOPHAGE) 1000 MG tablet Take 1 tablet (1,000 mg total) by mouth 2 (two) times daily with a meal. 10/20/12   Angiulli, Mcarthur Rossettianiel J, PA-C  metoprolol succinate (TOPROL-XL) 50 MG 24 hr tablet Take 1  tablet (50 mg total) by mouth every morning. Take with or immediately following a meal. 10/20/12   Angiulli, Mcarthur Rossettianiel J, PA-C  polyethylene glycol powder (GLYCOLAX/MIRALAX) powder Take 17 g by mouth 2 (two) times daily. Patient not taking: Reported on 09/18/2016 01/06/16   Everlene Farrieransie, William, PA-C  potassium chloride SA (K-DUR,KLOR-CON) 20 MEQ tablet Take 1 tablet (20 mEq total) by mouth daily as needed (For potassium loss.). 10/20/12   Angiulli, Mcarthur Rossettianiel J, PA-C  predniSONE (DELTASONE) 20 MG tablet 3 tabs po day one, then 2 po daily x 4 days Patient not taking: Reported on 09/18/2016  09/27/15   Lurene ShadowPhelps, Erin O, PA-C  rosuvastatin (CRESTOR) 10 MG tablet Take 10 mg by mouth daily.    [provider]    Family History History reviewed. No pertinent family history.  Social History Social History   Tobacco Use  . Smoking status: Former Smoker    Last attempt to quit: 01/13/2012    Years since quitting: 4.9  . Smokeless tobacco: Never Used  Substance Use Topics  . Alcohol use: Yes  . Drug use: No     Allergies   Patient has no known allergies.   Review of Systems Review of Systems  Constitutional: Negative for fever.  Respiratory: Negative for cough and shortness of breath.   Cardiovascular: Positive for chest pain.  Gastrointestinal: Negative for abdominal pain.  All other systems reviewed and are negative.    Physical Exam Updated Vital Signs BP (!) 114/56   Pulse (!) 59   Temp 98.7 F (37.1 C) (Oral)   Resp 16   Ht 5' 9.5" (1.765 m)   Wt 111.1 kg (245 lb)   SpO2 95%   BMI 35.66 kg/m   Physical Exam  Constitutional: He appears well-developed and well-nourished. No distress.  HENT:  Head: Normocephalic and atraumatic.  Eyes: Conjunctivae are normal.  Neck: Normal range of motion. Neck supple.  Tracheostomy tube in place  Cardiovascular: Normal rate, regular rhythm and normal pulses.  No murmur heard. No chest wall tenderness  Pulmonary/Chest: Effort normal and breath sounds normal. No respiratory distress. He has no wheezes. He has no rhonchi. He has no rales.  Abdominal: Soft.  Neurological: He is alert.  Skin: Skin is warm.  Psychiatric: He has a normal mood and affect. His behavior is normal.  Nursing note and vitals reviewed.    ED Treatments / Results  Labs (all labs ordered are listed, but only abnormal results are displayed) Labs Reviewed  CBC WITH DIFFERENTIAL/PLATELET - Abnormal; Notable for the following components:      Result Value   RBC 3.86 (*)    Hemoglobin 11.6 (*)    HCT 37.5 (*)    All other components  within normal limits  BASIC METABOLIC PANEL - Abnormal; Notable for the following components:   Chloride 100 (*)    Glucose, Bld 115 (*)    BUN 23 (*)    All other components within normal limits  D-DIMER, QUANTITATIVE (NOT AT [facility])  I-STAT TROPONIN, ED  I-STAT TROPONIN, ED    EKG  EKG Interpretation  Date/Time:  Sunday December 06 2016 21:26:09 EST Ventricular Rate:  78 PR Interval:    QRS Duration: 135 QT Interval:  409 QTC Calculation: 466 R Axis:   -91 Text Interpretation:  Sinus rhythm RBBB and LAFB Baseline wander in lead(s) I II aVR aVL No significant change since last tracing Confirmed by Jacalyn LefevreHaviland, Julie [phone]([id number]) on 12/06/2016 11:09:23 PM  Radiology Dg Chest 2 View  Result Date: 12/06/2016 CLINICAL DATA:  Chest pain EXAM: CHEST  2 VIEW COMPARISON:  11/28/2016, 03/13/2016 FINDINGS: Elevation of the left diaphragm. Minimal atelectasis at the left base. Calcified lung nodules at the right base. No acute consolidation or pleural effusion. Normal cardiomediastinal silhouette. Degenerative changes of the spine. Tracheostomy tube is in place. IMPRESSION: Stable elevation of the left diaphragm with basilar atelectasis. No acute pulmonary infiltrate. Electronically Signed   By: Jasmine Pang M.D.   On: 12/06/2016 23:21    Procedures Procedures (including critical care time)  Medications Ordered in ED Medications - No data to display   Initial Impression / Assessment and Plan / ED Course  I have reviewed the triage vital signs and the nursing notes.  Pertinent labs & imaging results that were available during my care of the patient were reviewed by me and considered in my medical decision making (see chart for details).     Patient, that is tracheostomy dependent, presenting with acute onset of right-sided sharp chest pain that is pleuritic and worse with movement of his chin.  Chest pain is reproducible on exam.  No cardiac history, however with risk factors. Normal  stress test this summer. Troponin x2 negative.  EKG without ischemic changes.  Chest x-ray negative. D-dimer negative.  Labs unremarkable. Not in distress. Doubt cardiac or pulmonary etiology of symptoms. Suspect musculoskeletal. Recommend follow-up with PCP and is safe for discharge at this time.  Pt discussed with and seen by Dr. Particia Nearing, who agrees with care plan.  Discussed results, findings, treatment and follow up. Patient advised of return precautions. Patient verbalized understanding and agreed with plan.  Final Clinical Impressions(s) / ED Diagnoses   Final diagnoses:  Right-sided chest wall pain    ED Discharge Orders    None       Robinson, [place] N, PA-C 12/07/16 1610    Jacalyn Lefevre, MD 12/09/16 [phone]

## 2016-12-07 ENCOUNTER — Other Ambulatory Visit: Payer: Self-pay

## 2016-12-07 DIAGNOSIS — R0789 Other chest pain: Secondary | ICD-10-CM | POA: Diagnosis not present

## 2016-12-07 LAB — BASIC METABOLIC PANEL
Anion gap: 8 (ref 5–15)
BUN: 23 mg/dL — ABNORMAL HIGH (ref 6–20)
CHLORIDE: 100 mmol/L — AB (ref 101–111)
CO2: 31 mmol/L (ref 22–32)
Calcium: 9.7 mg/dL (ref 8.9–10.3)
Creatinine, Ser: 0.99 mg/dL (ref 0.61–1.24)
GFR calc non Af Amer: >60 mL/min (ref >60)
Glucose, Bld: 115 mg/dL — ABNORMAL HIGH (ref 65–99)
Potassium: 3.9 mmol/L (ref 3.5–5.1)
Sodium: 139 mmol/L (ref 135–145)

## 2016-12-07 LAB — CBC WITH DIFFERENTIAL/PLATELET
BASOS ABS: 0 10*3/uL (ref 0.0–0.1)
BASOS PCT: 1 %
Eosinophils Absolute: 0.2 10*3/uL (ref 0.0–0.7)
Eosinophils Relative: 2 %
HEMATOCRIT: 37.5 % — AB (ref 39.0–52.0)
HEMOGLOBIN: 11.6 g/dL — AB (ref 13.0–17.0)
Lymphocytes Relative: 21 %
Lymphs Abs: 1.7 10*3/uL (ref 0.7–4.0)
MCH: 30.1 pg (ref 26.0–34.0)
MCHC: 30.9 g/dL (ref 30.0–36.0)
MCV: 97.2 fL (ref 78.0–100.0)
MONOS PCT: 8 %
Monocytes Absolute: 0.6 10*3/uL (ref 0.1–1.0)
NEUTROS ABS: 5.5 10*3/uL (ref 1.7–7.7)
NEUTROS PCT: 68 %
Platelets: 257 10*3/uL (ref 150–400)
RBC: 3.86 MIL/uL — ABNORMAL LOW (ref 4.22–5.81)
RDW: 14.1 % (ref 11.5–15.5)
WBC: 8 10*3/uL (ref 4.0–10.5)

## 2016-12-07 LAB — D-DIMER, QUANTITATIVE: D-Dimer, Quant: 0.36 ug/mL-FEU (ref 0.00–0.50)

## 2016-12-07 LAB — I-STAT TROPONIN, ED: TROPONIN I, POC: 0 ng/mL (ref 0.00–0.08)

## 2016-12-07 NOTE — Discharge Instructions (Signed)
You can take Tylenol as needed for pain. Please follow-up with your primary care provider if symptoms persist. Return to the ER if the chest pain becomes exertional, with associated shortness of breath, or new or concerning symptoms.

## 2016-12-30 ENCOUNTER — Encounter: Payer: Self-pay | Admitting: Podiatry

## 2016-12-30 ENCOUNTER — Ambulatory Visit (INDEPENDENT_AMBULATORY_CARE_PROVIDER_SITE_OTHER): Payer: Medicare Other | Admitting: Podiatry

## 2016-12-30 DIAGNOSIS — B351 Tinea unguium: Secondary | ICD-10-CM | POA: Diagnosis not present

## 2016-12-30 DIAGNOSIS — L84 Corns and callosities: Secondary | ICD-10-CM

## 2016-12-30 DIAGNOSIS — M79675 Pain in left toe(s): Secondary | ICD-10-CM

## 2016-12-30 DIAGNOSIS — E1142 Type 2 diabetes mellitus with diabetic polyneuropathy: Secondary | ICD-10-CM

## 2016-12-30 DIAGNOSIS — M79674 Pain in right toe(s): Secondary | ICD-10-CM

## 2016-12-30 DIAGNOSIS — M79676 Pain in unspecified toe(s): Secondary | ICD-10-CM

## 2016-12-30 NOTE — Progress Notes (Signed)
Complaint:  Visit Type: Patient returns to my office for continued preventative foot care services. Complaint: Patient states" my nails have grown long and thick and become painful to walk and wear shoes"  Patient has been diagnosed with DM with no foot complications. The patient presents for preventative foot care services. No changes to ROS.  Patient has a tracheotomy.  Podiatric Exam: Vascular: dorsalis pedis and posterior tibial pulses are palpable bilateral. Capillary return is immediate. Temperature gradient is WNL. Skin turgor WNL .  Severe leg swelling. Sensorium: Normal Semmes Weinstein monofilament test. Normal tactile sensation bilaterally. Nail Exam: Pt has thick disfigured discolored nails with subungual debris noted bilateral entire nail hallux through fifth toenails Ulcer Exam: There is no evidence of ulcer or pre-ulcerative changes or infection. Orthopedic Exam: Muscle tone and strength are WNL. No limitations in general ROM except  STJ  B/L. No crepitus or effusions .  Hammer toes 2,3  B/L.   Skin: No Porokeratosis. No infection or ulcers.  Clavi 2,3 left.  Diagnosis:  Onychomycosis, , Pain in right toe, pain in left toes, Clavi  2,3 left  Treatment & Plan Procedures and Treatment: Consent by patient was obtained for treatment procedures.   Debridement of mycotic and hypertrophic toenails, 1 through 5 bilateral and clearing of subungual debris. No ulceration, no infection noted..    Return Visit-Office Procedure: Patient instructed to return to the office for a follow up visit 3 months for continued evaluation and treatment..  He again said he is having pain in his right ankle.  I told him to make an appointment with Dr. Logan Key as Dr. Leeanne Deeduchman. Recommended at his last visit in June.      Dale GuntherGregory Cherae Key DPM

## 2017-01-09 DIAGNOSIS — R600 Localized edema: Secondary | ICD-10-CM | POA: Insufficient documentation

## 2017-01-11 ENCOUNTER — Ambulatory Visit (INDEPENDENT_AMBULATORY_CARE_PROVIDER_SITE_OTHER): Payer: Medicare Other

## 2017-01-11 ENCOUNTER — Other Ambulatory Visit: Payer: Self-pay | Admitting: Podiatry

## 2017-01-11 ENCOUNTER — Ambulatory Visit (INDEPENDENT_AMBULATORY_CARE_PROVIDER_SITE_OTHER): Payer: Medicare Other | Admitting: Podiatry

## 2017-01-11 VITALS — BP 115/46 | HR 75

## 2017-01-11 DIAGNOSIS — R52 Pain, unspecified: Secondary | ICD-10-CM

## 2017-01-11 DIAGNOSIS — M65979 Unspecified synovitis and tenosynovitis, unspecified ankle and foot: Secondary | ICD-10-CM

## 2017-01-11 DIAGNOSIS — M659 Synovitis and tenosynovitis, unspecified: Secondary | ICD-10-CM | POA: Diagnosis not present

## 2017-01-16 NOTE — Progress Notes (Signed)
   Subjective:  62 year old male presents today for intermittent pain and tenderness to the right ankle that is been ongoing for the past 1-2 years.  He states the pain is worse in the morning.  After he applies pressure to the ankle the pain improves.  He has been wearing compression socks with some relief.  Patient presents for further treatment and evaluation.   Past Medical History:  Diagnosis Date  . Diabetes mellitus without complication (HCC)   . Glaucoma   . Hyperlipidemia   . Hypertension   . Tracheostomy present (HCC)        Objective / Physical Exam:  General:  The patient is alert and oriented x3 in no acute distress. Dermatology:  Skin is warm, dry and supple bilateral lower extremities. Negative for open lesions or macerations. Vascular:  Palpable pedal pulses bilaterally. No edema or erythema noted. Capillary refill within normal limits. Neurological:  Epicritic and protective threshold grossly intact bilaterally.  Musculoskeletal Exam:  Pain on palpation to the anterior lateral medial aspects of the patient's right ankle. Mild edema noted.  Range of motion within normal limits to all pedal and ankle joints bilateral. Muscle strength 5/5 in all groups bilateral.   Radiographic Exam:  DJD noted diffusely throughout foot and ankle joints. No fracture/dislocation/boney destruction.    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  X-rays reviewed.  Recommended good shoe gear. #2 injection of 0.5 mL Celestone Soluspan injected in the patient's right ankle. #3 Recommended good shoe gear. #4 Continue taking prednisone as directed per PCP. #5 return to clinic in 6 weeks.   Felecia ShellingBrent M. Evans, DPM [facility]  Dr. Felecia ShellingBrent M. Evans, DPM    [address]                                        [place], [place] [address]                Office [phone]  Fax [phone]  [phone]

## 2017-02-22 ENCOUNTER — Encounter: Payer: Medicare Other | Admitting: Podiatry

## 2017-02-25 ENCOUNTER — Other Ambulatory Visit (HOSPITAL_COMMUNITY): Payer: Self-pay | Admitting: Pulmonary Disease

## 2017-02-25 DIAGNOSIS — R1313 Dysphagia, pharyngeal phase: Secondary | ICD-10-CM

## 2017-03-03 ENCOUNTER — Ambulatory Visit (HOSPITAL_COMMUNITY)
Admission: RE | Admit: 2017-03-03 | Discharge: 2017-03-03 | Disposition: A | Discharge status: Home / Self Care | Payer: Medicare Other | Source: Ambulatory Visit | Attending: Pulmonary Disease | Admitting: Pulmonary Disease

## 2017-03-03 ENCOUNTER — Other Ambulatory Visit (HOSPITAL_COMMUNITY): Payer: Self-pay | Admitting: Pulmonary Disease

## 2017-03-03 ENCOUNTER — Telehealth (HOSPITAL_COMMUNITY): Payer: Self-pay | Admitting: Pulmonary Disease

## 2017-03-03 DIAGNOSIS — R0601 Orthopnea: Secondary | ICD-10-CM | POA: Insufficient documentation

## 2017-03-03 DIAGNOSIS — R1313 Dysphagia, pharyngeal phase: Secondary | ICD-10-CM

## 2017-03-03 DIAGNOSIS — R1319 Other dysphagia: Secondary | ICD-10-CM

## 2017-03-03 DIAGNOSIS — R2681 Unsteadiness on feet: Secondary | ICD-10-CM | POA: Insufficient documentation

## 2017-03-03 DIAGNOSIS — Z981 Arthrodesis status: Secondary | ICD-10-CM | POA: Diagnosis not present

## 2017-03-03 NOTE — Progress Notes (Signed)
Modified Barium Swallow Progress Note  Patient Details  Name: Dale Key MRN: [id number] Date of Birth: 04/21/1955  Today's Date: 03/03/2017  Modified Barium Swallow completed.  Full report located under Chart Review in the Imaging Section.  Brief recommendations include the following:  Clinical Impression  Pt demonstrates a mild cervical esophageal dysphagia; there is reduced CP opening due to presence of CP hardware and soft tissue at that site. Despite this, thin liquids pass with only mild UES residual. There is more notable solid residue that pt clears with a liquid wash or second swallow. Large barium tablet lodges above CP but pt cleared with a second swallow of liquids that also resulted in flash penetration. Recommend pt continue current diet and liquids though advised him to take large pills with puree, he reports that he usually crushes these. No SLP f/u needed at this time.    Swallow Evaluation Recommendations       SLP Diet Recommendations: Regular solids;Thin liquid   Liquid Administration via: Cup;Straw   Medication Administration: Whole meds with puree   Supervision: Patient able to self feed   Compensations: Slow rate;Small sips/bites;Multiple dry swallows after each bite/sip;Follow solids with liquid   Postural Changes: Seated upright at 90 degrees;Remain semi-upright after after feeds/meals (Comment)   Oral Care Recommendations: Patient independent with oral care       [facility] DivisionBonnie Juliany Daughety, MA CCC-SLP [phone]  Claudine MoutonDeBlois, Hargis Vandyne Caroline 03/03/2017,11:07 AM

## 2017-03-03 NOTE — Telephone Encounter (Signed)
Erika from Dr. Consuello BossierKilpatrick's office left a voicemail on our machine in regards to changing the barium swallow scheduled for patient today to a MBS with our Speech therapist due to aspiration risk.Per Delice Bisonara Dingus in radiology request as well, we scheduled patient and will perform OP MBS per physicians request.  Physician's office is closed today, so we did test per verbal order from physician's office.     By Don PerkingLowe, Wendi M

## 2017-03-04 NOTE — Progress Notes (Signed)
This encounter was created in error - please disregard.

## 2017-03-10 ENCOUNTER — Encounter (HOSPITAL_COMMUNITY): Payer: Self-pay | Admitting: Emergency Medicine

## 2017-03-10 ENCOUNTER — Emergency Department (HOSPITAL_COMMUNITY)
Admission: EM | Admit: 2017-03-10 | Discharge: 2017-03-10 | Disposition: A | Discharge status: Home / Self Care | Payer: Medicare Other | Attending: Emergency Medicine | Admitting: Emergency Medicine

## 2017-03-10 DIAGNOSIS — E119 Type 2 diabetes mellitus without complications: Secondary | ICD-10-CM | POA: Insufficient documentation

## 2017-03-10 DIAGNOSIS — Z79899 Other long term (current) drug therapy: Secondary | ICD-10-CM | POA: Insufficient documentation

## 2017-03-10 DIAGNOSIS — Z7982 Long term (current) use of aspirin: Secondary | ICD-10-CM | POA: Diagnosis not present

## 2017-03-10 DIAGNOSIS — Z7984 Long term (current) use of oral hypoglycemic drugs: Secondary | ICD-10-CM | POA: Diagnosis not present

## 2017-03-10 DIAGNOSIS — Y9389 Activity, other specified: Secondary | ICD-10-CM | POA: Diagnosis not present

## 2017-03-10 DIAGNOSIS — S81802A Unspecified open wound, left lower leg, initial encounter: Secondary | ICD-10-CM | POA: Insufficient documentation

## 2017-03-10 DIAGNOSIS — I10 Essential (primary) hypertension: Secondary | ICD-10-CM | POA: Insufficient documentation

## 2017-03-10 DIAGNOSIS — Z87891 Personal history of nicotine dependence: Secondary | ICD-10-CM | POA: Diagnosis not present

## 2017-03-10 DIAGNOSIS — X16XXXA Contact with hot heating appliances, radiators and pipes, initial encounter: Secondary | ICD-10-CM | POA: Insufficient documentation

## 2017-03-10 DIAGNOSIS — S8992XA Unspecified injury of left lower leg, initial encounter: Secondary | ICD-10-CM | POA: Diagnosis present

## 2017-03-10 DIAGNOSIS — Y999 Unspecified external cause status: Secondary | ICD-10-CM | POA: Insufficient documentation

## 2017-03-10 DIAGNOSIS — Y929 Unspecified place or not applicable: Secondary | ICD-10-CM | POA: Insufficient documentation

## 2017-03-10 MED ORDER — CEPHALEXIN 500 MG PO CAPS: 500.0000 mg | ORAL_CAPSULE | Freq: Four times a day (QID) | ORAL | 0 refills | Status: AC

## 2017-03-10 MED ORDER — SILVER SULFADIAZINE 1 % EX CREA
TOPICAL_CREAM | Freq: Once | CUTANEOUS | Status: AC
  Administered 2017-03-10: 1 via TOPICAL
  Filled 2017-03-10: qty 50

## 2017-03-10 NOTE — ED Provider Notes (Signed)
[facility]-EMERGENCY DEPT Provider Note   CSN: [id number] Arrival date & time: 03/10/17  1211     History   Chief Complaint Chief Complaint  Patient presents with  . Leg Injury    HPI Dale Key is a 62 y.o. male presenting for evaluation of left lower leg wound.  Patient states that he noticed the wound on his leg last night.  He was standing close to the heater, is unsure if he burned his leg or if this is a skin tear.  He denies trauma or injury.  He denies pain at the time, and states he has no neuropathy of his legs.  Patient was seen by his cardiologist today for bilateral leg swelling, he was started on Lasix.  He denies pain at the site currently.  He denies fevers, chills, or drainage from the site. His cardiologist told him to go to the wound clinic, but pt came to the ER for wound care.   HPI  Past Medical History:  Diagnosis Date  . Diabetes mellitus without complication (HCC)   . Glaucoma   . Hyperlipidemia   . Hypertension   . Tracheostomy present [facility](HCC)     Patient Active Problem List   Diagnosis Date Noted  . Bilateral lower extremity edema 01/09/2017  . 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 Bilateral vocal cord paralysis 12/22/2015  . Oropharyngeal dysphagia 12/02/2015  . Elevated troponin level 11/19/2015  . Hypertension 11/14/2015  . Type 2 diabetes mellitus, without long-term current use of insulin (HCC) 11/14/2015  . BPH (benign prostatic hyperplasia) 06/14/2014  . Epididymitis, right 05/17/2014  . Hematuria 05/17/2014  . Swollen testicle 05/17/2014  . Testis mass 05/17/2014  . Gross hematuria 10/19/2013  . Central cord syndrome [facility](HCC) 10/13/2012    Past Surgical History:  Procedure Laterality Date  . ANTERIOR CERVICAL DECOMP/DISCECTOMY FUSION N/A 10/07/2012   Procedure: ANTERIOR CERVICAL DECOMPRESSION/DISCECTOMY FUSION 2 LEVELS;  Surgeon: Karn CassisErnesto M Botero, MD;  [facility]  [facility];  Service: Neurosurgery;  Laterality: N/A;  Anterior Cervical Three-Four/Four-Five Decompression and Fusion  . EYE SURGERY    . FOOT SURGERY Left    Pt states a tendon was removed to replace one in right hand.  Marland Kitchen. HAND SURGERY Right   . TRACHEOSTOMY      OB History    No data available       Home Medications    Prior to Admission medications   Medication Sig Start Date End Date Taking? Authorizing Provider  amoxicillin-clavulanate (AUGMENTIN) 875-125 MG tablet Take 1 tablet by mouth 2 (two) times daily. Patient not taking: Reported on 09/18/2016 03/13/16   Lorre NickAllen, Anthony, MD  aspirin 81 MG tablet Take 81 mg by mouth daily.    [provider]  azithromycin (ZITHROMAX) 250 MG tablet Take 1 tablet (250 mg total) by mouth daily. Take first 2 tablets together, then 1 every day until finished. Patient not taking: Reported on 09/18/2016 09/27/15   Lurene ShadowPhelps, Erin O, PA-C  cephALEXin (KEFLEX) 500 MG capsule Take 1 capsule (500 mg total) by mouth 4 (four) times daily for 7 days. 03/10/17 03/17/17  Missie Gehrig, PA-C  Cholecalciferol (VITAMIN D-1000 MAX ST) 1000 units tablet Take 1,000 Units by mouth daily.    [provider]  doxazosin (CARDURA) 4 MG tablet Take 4 mg by mouth at bedtime. 12/20/15   [provider]  furosemide (LASIX) 40 MG tablet Take 1 tablet (40 mg total) by mouth daily. 10/20/12  Angiulli, Mcarthur Rossetti, PA-C  ibuprofen (CHILD IBUPROFEN) 100 MG/5ML suspension Take 20 mLs (400 mg total) by mouth every 6 (six) hours as needed for fever, mild pain or moderate pain. Patient not taking: Reported on 09/18/2016 09/27/15   Lurene Shadow, PA-C  linaclotide [facility]) 145 MCG CAPS capsule Take 145 mcg by mouth daily.    [provider]  losartan (COZAAR) 100 MG tablet Take 50 mg by mouth every morning.  10/20/12   Angiulli, Mcarthur Rossetti, PA-C  metFORMIN (GLUCOPHAGE) 1000 MG tablet Take 1 tablet (1,000 mg total) by mouth 2 (two) times daily with a meal. 10/20/12    Angiulli, Mcarthur Rossetti, PA-C  metoprolol succinate (TOPROL-XL) 50 MG 24 hr tablet Take 1 tablet (50 mg total) by mouth every morning. Take with or immediately following a meal. 10/20/12   Angiulli, Mcarthur Rossetti, PA-C  polyethylene glycol powder (GLYCOLAX/MIRALAX) powder Take 17 g by mouth 2 (two) times daily. Patient not taking: Reported on 09/18/2016 01/06/16   Everlene Farrier, PA-C  potassium chloride SA (K-DUR,KLOR-CON) 20 MEQ tablet Take 1 tablet (20 mEq total) by mouth daily as needed (For potassium loss.). 10/20/12   Angiulli, Mcarthur Rossetti, PA-C  predniSONE (DELTASONE) 20 MG tablet 3 tabs po day one, then 2 po daily x 4 days Patient not taking: Reported on 09/18/2016 09/27/15   Lurene Shadow, PA-C  rosuvastatin (CRESTOR) 10 MG tablet Take 10 mg by mouth daily.    [provider]    Family History No family history on file.  Social History Social History   Tobacco Use  . Smoking status: Former Smoker    Last attempt to quit: 01/13/2012    Years since quitting: 5.1  . Smokeless tobacco: Never Used  Substance Use Topics  . Alcohol use: Yes  . Drug use: No     Allergies   Patient has no known allergies.   Review of Systems Review of Systems  Constitutional: Negative for chills and fever.  Cardiovascular: Positive for leg swelling.  Skin: Positive for wound.     Physical Exam Updated Vital Signs BP (!) 131/57 (BP Location: Right Arm)   Pulse 76   Temp 98.1 F (36.7 C) (Oral)   Resp 15   SpO2 97%   Physical Exam  Constitutional: He is oriented to person, place, and time. He appears well-developed and well-nourished. No distress.  HENT:  Head: Normocephalic and atraumatic.  Eyes: EOM are normal.  Neck: Normal range of motion.  Cardiovascular: Normal rate, regular rhythm and intact distal pulses.  Pulmonary/Chest: Effort normal and breath sounds normal. No respiratory distress. He has no wheezes.  Abdominal: He exhibits no distension.  Musculoskeletal: Normal range of  motion. He exhibits edema.  Bilateral 2+ pitting edema  Neurological: He is alert and oriented to person, place, and time.  Skin:  ~5cm x 3 cm wound of the left lower lateral leg.  Skin is bunched at the bottom of the wound, and pink tissue noted at the wound.  No drainage.  No pain.  Skin below the wound filled with clear liquid.  No surrounding erythema or warmth.  No wound noted elsewhere.  Psychiatric: He has a normal mood and affect.  Nursing note and vitals reviewed.    ED Treatments / Results  Labs (all labs ordered are listed, but only abnormal results are displayed) Labs Reviewed - No data to display  EKG  EKG Interpretation None       Radiology No results found.  Procedures  Debridement Date/Time: 03/10/2017 2:39 PM Performed by: Alveria Apley, PA-C Authorized by: Alveria Apley, PA-C  Consent: Verbal consent obtained. Risks and benefits: risks, benefits and alternatives were discussed Consent given by: patient Local anesthesia used: no  Anesthesia: Local anesthesia used: no  Sedation: Patient sedated: no  Patient tolerance: Patient tolerated the procedure well with no immediate complications Comments: Wound cleaned with Shur-Clens.  Edges of the wound debrided with scissors.  Wound irrigated again and dressed with Silvadene and nonstick dressing. Pt tolerated well    (including critical care time)  Medications Ordered in ED Medications  silver sulfADIAZINE (SILVADENE) 1 % cream (1 application Topical Given 03/10/17 1454)     Initial Impression / Assessment and Plan / ED Course  I have reviewed the triage vital signs and the nursing notes.  Pertinent labs & imaging results that were available during my care of the patient were reviewed by me and considered in my medical decision making (see chart for details).  Clinical Course as of Mar 10 1528  Wed Mar 10, 2017  [id number] 62 year old male with history of diabetes and peripheral edema here with  worsening edema and a new skin wound on his left lower leg.  Patient wonders if he was getting too close to the radiator and he got a burn.  It is unclear if he was to be here for at the wound center for evaluation.  He had seen his cardiologist earlier today they were adjusting his diuretics.  He denies any fever is taking good p.o. denies any pain.  He is got approximately 6 x 5 cm oval ruptured blister with a clean base.  There is no surrounding skin injury so I doubt a burn.  We are going to see if he can get over the wound clinic because would be the best for him.  [MB]    Clinical Course User Index [MB] Terrilee Files, MD    Patient presenting for evaluation of left lower leg wound.  Physical exam shows wound that is likely due to edema, less likely due to burn.  Skin does not appear burned and is filled with clear liquid. No sign of infection currently, no cellulitis, drainage, or fevers.  However, patient at high risk due to diabetes for developing an infection.  Case dicussed with attending, Dr. Charm Barges evaluated the pt. Called wound clinic, who stated the first appointment they can get him is on March 8 (in 10 days), and appointment was made.  Wound debrided, cleaned, and dressed.  Will give silvadene and antibiotics for prophylaxis.  Patient instructed to follow-up with the wound care center.  Strict return precautions given.  At this time, patient appears safe for discharge.  Patient states he understands and agrees to plan.  Final Clinical Impressions(s) / ED Diagnoses   Final diagnoses:  Wound of left lower extremity, initial encounter    ED Discharge Orders        Ordered    cephALEXin (KEFLEX) 500 MG capsule  4 times daily     03/10/17 1442       Alveria Apley, PA-C 03/10/17 1530    Terrilee Files, MD 03/11/17 [phone]

## 2017-03-10 NOTE — ED Triage Notes (Signed)
Patient presents ambulatory with cane stating last night he either burned his left calf or a skin tear.

## 2017-03-10 NOTE — Discharge Instructions (Signed)
Keep the wound clean.  Wash daily with soap and water.   Apply Silvadene cream twice a day. Take antibiotics as prescribed. Take all your other medications as prescribed.  Keep the wound covered. Follow-up with the wound care center.  You have an appointment at 8 AM on the eighth.  They will call you if there is an earlier appointment available. Return to the emergency room if you develop fevers, pus draining from the site, significant pain at the site, or any new or concerning symptoms.

## 2017-03-15 ENCOUNTER — Encounter (HOSPITAL_BASED_OUTPATIENT_CLINIC_OR_DEPARTMENT_OTHER): Payer: Medicare Other | Attending: Internal Medicine

## 2017-03-15 DIAGNOSIS — E1142 Type 2 diabetes mellitus with diabetic polyneuropathy: Secondary | ICD-10-CM | POA: Insufficient documentation

## 2017-03-15 DIAGNOSIS — I10 Essential (primary) hypertension: Secondary | ICD-10-CM | POA: Insufficient documentation

## 2017-03-15 DIAGNOSIS — Z7984 Long term (current) use of oral hypoglycemic drugs: Secondary | ICD-10-CM | POA: Insufficient documentation

## 2017-03-15 DIAGNOSIS — E11622 Type 2 diabetes mellitus with other skin ulcer: Secondary | ICD-10-CM | POA: Diagnosis not present

## 2017-03-15 DIAGNOSIS — G4730 Sleep apnea, unspecified: Secondary | ICD-10-CM | POA: Insufficient documentation

## 2017-03-15 DIAGNOSIS — L97822 Non-pressure chronic ulcer of other part of left lower leg with fat layer exposed: Secondary | ICD-10-CM | POA: Diagnosis not present

## 2017-03-15 DIAGNOSIS — I890 Lymphedema, not elsewhere classified: Secondary | ICD-10-CM | POA: Diagnosis not present

## 2017-03-19 ENCOUNTER — Encounter (HOSPITAL_BASED_OUTPATIENT_CLINIC_OR_DEPARTMENT_OTHER): Payer: Medicare Other

## 2017-03-22 DIAGNOSIS — E11622 Type 2 diabetes mellitus with other skin ulcer: Secondary | ICD-10-CM | POA: Diagnosis not present

## 2017-03-29 DIAGNOSIS — E11622 Type 2 diabetes mellitus with other skin ulcer: Secondary | ICD-10-CM | POA: Diagnosis not present

## 2017-03-30 ENCOUNTER — Ambulatory Visit: Payer: Medicare Other | Admitting: Podiatry

## 2017-04-05 DIAGNOSIS — E11622 Type 2 diabetes mellitus with other skin ulcer: Secondary | ICD-10-CM | POA: Diagnosis not present

## 2017-04-12 ENCOUNTER — Encounter (HOSPITAL_BASED_OUTPATIENT_CLINIC_OR_DEPARTMENT_OTHER): Payer: Medicare Other | Attending: Internal Medicine

## 2017-04-12 DIAGNOSIS — E1142 Type 2 diabetes mellitus with diabetic polyneuropathy: Secondary | ICD-10-CM | POA: Insufficient documentation

## 2017-04-12 DIAGNOSIS — L97822 Non-pressure chronic ulcer of other part of left lower leg with fat layer exposed: Secondary | ICD-10-CM | POA: Insufficient documentation

## 2017-04-12 DIAGNOSIS — I890 Lymphedema, not elsewhere classified: Secondary | ICD-10-CM | POA: Insufficient documentation

## 2017-04-12 DIAGNOSIS — E11622 Type 2 diabetes mellitus with other skin ulcer: Secondary | ICD-10-CM | POA: Insufficient documentation

## 2017-04-19 DIAGNOSIS — E11622 Type 2 diabetes mellitus with other skin ulcer: Secondary | ICD-10-CM | POA: Diagnosis not present

## 2017-04-26 DIAGNOSIS — E11622 Type 2 diabetes mellitus with other skin ulcer: Secondary | ICD-10-CM | POA: Diagnosis not present

## 2017-04-29 DIAGNOSIS — Z981 Arthrodesis status: Secondary | ICD-10-CM | POA: Insufficient documentation

## 2017-05-03 DIAGNOSIS — E11622 Type 2 diabetes mellitus with other skin ulcer: Secondary | ICD-10-CM | POA: Diagnosis not present

## 2017-05-13 ENCOUNTER — Encounter (HOSPITAL_BASED_OUTPATIENT_CLINIC_OR_DEPARTMENT_OTHER): Payer: Medicare Other | Attending: Internal Medicine

## 2017-05-13 DIAGNOSIS — I890 Lymphedema, not elsewhere classified: Secondary | ICD-10-CM | POA: Insufficient documentation

## 2017-05-13 DIAGNOSIS — E1142 Type 2 diabetes mellitus with diabetic polyneuropathy: Secondary | ICD-10-CM | POA: Insufficient documentation

## 2017-05-13 DIAGNOSIS — G4730 Sleep apnea, unspecified: Secondary | ICD-10-CM | POA: Insufficient documentation

## 2017-05-13 DIAGNOSIS — Z8631 Personal history of diabetic foot ulcer: Secondary | ICD-10-CM | POA: Diagnosis not present

## 2017-05-13 DIAGNOSIS — I10 Essential (primary) hypertension: Secondary | ICD-10-CM | POA: Insufficient documentation

## 2017-05-13 DIAGNOSIS — Z09 Encounter for follow-up examination after completed treatment for conditions other than malignant neoplasm: Secondary | ICD-10-CM | POA: Insufficient documentation

## 2017-06-16 DIAGNOSIS — R29898 Other symptoms and signs involving the musculoskeletal system: Secondary | ICD-10-CM | POA: Insufficient documentation

## 2017-07-07 ENCOUNTER — Encounter: Payer: Self-pay | Admitting: Podiatry

## 2017-07-07 ENCOUNTER — Ambulatory Visit (INDEPENDENT_AMBULATORY_CARE_PROVIDER_SITE_OTHER): Payer: Medicare Other | Admitting: Podiatry

## 2017-07-07 DIAGNOSIS — M79675 Pain in left toe(s): Secondary | ICD-10-CM

## 2017-07-07 DIAGNOSIS — B351 Tinea unguium: Secondary | ICD-10-CM | POA: Diagnosis not present

## 2017-07-07 DIAGNOSIS — M79674 Pain in right toe(s): Secondary | ICD-10-CM

## 2017-07-07 DIAGNOSIS — L84 Corns and callosities: Secondary | ICD-10-CM

## 2017-07-07 NOTE — Progress Notes (Signed)
Complaint:  Visit Type: Patient returns to my office for continued preventative foot care services. Complaint: Patient states" my nails have grown long and thick and become painful to walk and wear shoes"  Patient has been diagnosed with DM with no foot complications. The patient presents for preventative foot care services. No changes to ROS.  Patient has a tracheotomy.  Podiatric Exam: Vascular: dorsalis pedis and posterior tibial pulses are palpable bilateral. Capillary return is immediate. Temperature gradient is WNL. Skin turgor WNL .  Severe leg swelling. Sensorium: Normal Semmes Weinstein monofilament test. Normal tactile sensation bilaterally. Nail Exam: Pt has thick disfigured discolored nails with subungual debris noted bilateral entire nail hallux through fifth toenails Ulcer Exam: There is no evidence of ulcer or pre-ulcerative changes or infection. Orthopedic Exam: Muscle tone and strength are WNL. No limitations in general ROM except  STJ  B/L. No crepitus or effusions .  Hammer toes 2,3  B/L.   Skin: No Porokeratosis. No infection or ulcers.  Clavi ,3 left.  Diagnosis:  Onychomycosis, , Pain in right toe, pain in left toes, Clavi  ,3 left  Treatment & Plan Procedures and Treatment: Consent by patient was obtained for treatment procedures.   Debridement of mycotic and hypertrophic toenails, 1 through 5 bilateral and clearing of subungual debris. No ulceration, no infection noted..    Return Visit-Office Procedure: Patient instructed to return to the office for a follow up visit 3 months for continued evaluation and treatment.Helane Gunther.        Nixie Laube DPM

## 2017-09-03 ENCOUNTER — Other Ambulatory Visit (HOSPITAL_COMMUNITY): Payer: Self-pay | Admitting: Specialist

## 2017-09-03 DIAGNOSIS — J986 Disorders of diaphragm: Secondary | ICD-10-CM

## 2017-09-10 ENCOUNTER — Ambulatory Visit (HOSPITAL_COMMUNITY)
Admission: RE | Admit: 2017-09-10 | Discharge: 2017-09-10 | Disposition: A | Discharge status: Home / Self Care | Payer: Medicare Other | Source: Ambulatory Visit | Attending: Specialist | Admitting: Specialist

## 2017-09-10 DIAGNOSIS — J986 Disorders of diaphragm: Secondary | ICD-10-CM | POA: Diagnosis present

## 2017-10-06 ENCOUNTER — Encounter: Payer: Self-pay | Admitting: Podiatry

## 2017-10-06 ENCOUNTER — Ambulatory Visit (INDEPENDENT_AMBULATORY_CARE_PROVIDER_SITE_OTHER): Payer: Medicare Other | Admitting: Podiatry

## 2017-10-06 DIAGNOSIS — B351 Tinea unguium: Secondary | ICD-10-CM

## 2017-10-06 DIAGNOSIS — M79674 Pain in right toe(s): Secondary | ICD-10-CM | POA: Diagnosis not present

## 2017-10-06 DIAGNOSIS — M79675 Pain in left toe(s): Secondary | ICD-10-CM | POA: Diagnosis not present

## 2017-10-06 DIAGNOSIS — E1142 Type 2 diabetes mellitus with diabetic polyneuropathy: Secondary | ICD-10-CM

## 2017-10-06 DIAGNOSIS — L84 Corns and callosities: Secondary | ICD-10-CM | POA: Diagnosis not present

## 2017-10-06 NOTE — Progress Notes (Signed)
Complaint:  Visit Type: Patient returns to my office for continued preventative foot care services. Complaint: Patient states" my nails have grown long and thick and become painful to walk and wear shoes"  Patient has been diagnosed with DM with no foot complications. The patient presents for preventative foot care services. No changes to ROS.  Patient has a tracheotomy.  Podiatric Exam: Vascular: dorsalis pedis and posterior tibial pulses are weakly  palpable bilateral. Capillary return is immediate. Temperature gradient is WNL. Skin turgor WNL .  Severe leg swelling. Sensorium: Normal Semmes Weinstein monofilament test. Normal tactile sensation bilaterally. Nail Exam: Pt has thick disfigured discolored nails with subungual debris noted bilateral entire nail hallux through fifth toenails Ulcer Exam: There is no evidence of ulcer or pre-ulcerative changes or infection. Orthopedic Exam: Muscle tone and strength are WNL. No limitations in general ROM except  STJ  B/L. No crepitus or effusions .  Hammer toes 2,3  B/L.   Skin: No Porokeratosis. No infection or ulcers.  Clavi ,3 left.  Diagnosis:  Onychomycosis, , Pain in right toe, pain in left toes, Clavi  ,3 left  Treatment & Plan Procedures and Treatment: Consent by patient was obtained for treatment procedures.   Debridement of mycotic and hypertrophic toenails, 1 through 5 bilateral and clearing of subungual debris. No ulceration, no infection noted..    Return Visit-Office Procedure: Patient instructed to return to the office for a follow up visit 3 months for continued evaluation and treatment..        Kathya Wilz DPM 

## 2017-10-20 DIAGNOSIS — M159 Polyosteoarthritis, unspecified: Secondary | ICD-10-CM | POA: Insufficient documentation

## 2017-10-20 DIAGNOSIS — E1165 Type 2 diabetes mellitus with hyperglycemia: Secondary | ICD-10-CM | POA: Insufficient documentation

## 2017-10-20 DIAGNOSIS — D649 Anemia, unspecified: Secondary | ICD-10-CM | POA: Insufficient documentation

## 2017-10-20 DIAGNOSIS — G5680 Other specified mononeuropathies of unspecified upper limb: Secondary | ICD-10-CM | POA: Insufficient documentation

## 2017-10-20 DIAGNOSIS — G588 Other specified mononeuropathies: Secondary | ICD-10-CM | POA: Insufficient documentation

## 2017-10-20 DIAGNOSIS — K5900 Constipation, unspecified: Secondary | ICD-10-CM | POA: Insufficient documentation

## 2017-10-20 DIAGNOSIS — I10 Essential (primary) hypertension: Secondary | ICD-10-CM | POA: Insufficient documentation

## 2017-10-20 DIAGNOSIS — R499 Unspecified voice and resonance disorder: Secondary | ICD-10-CM | POA: Insufficient documentation

## 2017-11-15 DIAGNOSIS — M5000 Cervical disc disorder with myelopathy, unspecified cervical region: Secondary | ICD-10-CM | POA: Insufficient documentation

## 2017-11-25 DIAGNOSIS — Z6841 Body Mass Index (BMI) 40.0 and over, adult: Secondary | ICD-10-CM | POA: Insufficient documentation

## 2017-12-31 ENCOUNTER — Encounter (INDEPENDENT_AMBULATORY_CARE_PROVIDER_SITE_OTHER): Payer: Self-pay

## 2017-12-31 ENCOUNTER — Ambulatory Visit (INDEPENDENT_AMBULATORY_CARE_PROVIDER_SITE_OTHER): Payer: Medicare Other | Admitting: Podiatry

## 2017-12-31 ENCOUNTER — Encounter: Payer: Self-pay | Admitting: Podiatry

## 2017-12-31 DIAGNOSIS — E1142 Type 2 diabetes mellitus with diabetic polyneuropathy: Secondary | ICD-10-CM | POA: Diagnosis not present

## 2017-12-31 DIAGNOSIS — L84 Corns and callosities: Secondary | ICD-10-CM | POA: Diagnosis not present

## 2017-12-31 DIAGNOSIS — M79674 Pain in right toe(s): Secondary | ICD-10-CM

## 2017-12-31 DIAGNOSIS — B351 Tinea unguium: Secondary | ICD-10-CM | POA: Diagnosis not present

## 2017-12-31 DIAGNOSIS — M79675 Pain in left toe(s): Secondary | ICD-10-CM | POA: Diagnosis not present

## 2017-12-31 NOTE — Progress Notes (Signed)
Complaint:  Visit Type: Patient returns to my office for continued preventative foot care services. Complaint: Patient states" my nails have grown long and thick and become painful to walk and wear shoes"  Patient has been diagnosed with DM with no foot complications. The patient presents for preventative foot care services. No changes to ROS.  Patient has a tracheotomy.  Podiatric Exam: Vascular: dorsalis pedis and posterior tibial pulses are weakly  palpable bilateral. Capillary return is immediate. Temperature gradient is WNL. Skin turgor WNL .  Severe leg swelling. Sensorium: Normal Semmes Weinstein monofilament test. Normal tactile sensation bilaterally. Nail Exam: Pt has thick disfigured discolored nails with subungual debris noted bilateral entire nail hallux through fifth toenails Ulcer Exam: There is no evidence of ulcer or pre-ulcerative changes or infection. Orthopedic Exam: Muscle tone and strength are WNL. No limitations in general ROM except  STJ  B/L. No crepitus or effusions .  Hammer toes 2,3  B/L.   Skin: No Porokeratosis. No infection or ulcers.  Clavi ,3 left.  Diagnosis:  Onychomycosis, , Pain in right toe, pain in left toes, Clavi  ,3 left  Treatment & Plan Procedures and Treatment: Consent by patient was obtained for treatment procedures.   Debridement of mycotic and hypertrophic toenails, 1 through 5 bilateral and clearing of subungual debris. No ulceration, no infection noted..    Return Visit-Office Procedure: Patient instructed to return to the office for a follow up visit 3 months for continued evaluation and treatment.Helane Gunther.        Jennie Bolar DPM

## 2018-03-09 ENCOUNTER — Ambulatory Visit: Payer: Medicare Other | Admitting: Podiatry

## 2018-03-24 ENCOUNTER — Ambulatory Visit (INDEPENDENT_AMBULATORY_CARE_PROVIDER_SITE_OTHER): Payer: Medicare Other | Admitting: Podiatry

## 2018-03-24 ENCOUNTER — Encounter: Payer: Self-pay | Admitting: Podiatry

## 2018-03-24 DIAGNOSIS — M79675 Pain in left toe(s): Secondary | ICD-10-CM

## 2018-03-24 DIAGNOSIS — B351 Tinea unguium: Secondary | ICD-10-CM | POA: Diagnosis not present

## 2018-03-24 DIAGNOSIS — E119 Type 2 diabetes mellitus without complications: Secondary | ICD-10-CM

## 2018-03-24 DIAGNOSIS — L84 Corns and callosities: Secondary | ICD-10-CM | POA: Diagnosis not present

## 2018-03-24 DIAGNOSIS — M79674 Pain in right toe(s): Secondary | ICD-10-CM | POA: Diagnosis not present

## 2018-03-24 NOTE — Patient Instructions (Signed)
Diabetes Mellitus and Foot Care Foot care is an important part of your health, especially when you have diabetes. Diabetes may cause you to have problems because of poor blood flow (circulation) to your feet and legs, which can cause your skin to:  Become thinner and drier.  Break more easily.  Heal more slowly.  Peel and crack. You may also have nerve damage (neuropathy) in your legs and feet, causing decreased feeling in them. This means that you may not notice minor injuries to your feet that could lead to more serious problems. Noticing and addressing any potential problems early is the best way to prevent future foot problems. How to care for your feet Foot hygiene  Wash your feet daily with warm water and mild soap. Do not use hot water. Then, pat your feet and the areas between your toes until they are completely dry. Do not soak your feet as this can dry your skin.  Trim your toenails straight across. Do not dig under them or around the cuticle. File the edges of your nails with an emery board or nail file.  Apply a moisturizing lotion or petroleum jelly to the skin on your feet and to dry, brittle toenails. Use lotion that does not contain alcohol and is unscented. Do not apply lotion between your toes. Shoes and socks  Wear clean socks or stockings every day. Make sure they are not too tight. Do not wear knee-high stockings since they may decrease blood flow to your legs.  Wear shoes that fit properly and have enough cushioning. Always look in your shoes before you put them on to be sure there are no objects inside.  To break in new shoes, wear them for just a few hours a day. This prevents injuries on your feet. Wounds, scrapes, corns, and calluses  Check your feet daily for blisters, cuts, bruises, sores, and redness. If you cannot see the bottom of your feet, use a mirror or ask someone for help.  Do not cut corns or calluses or try to remove them with medicine.  If you  find a minor scrape, cut, or break in the skin on your feet, keep it and the skin around it clean and dry. You may clean these areas with mild soap and water. Do not clean the area with peroxide, alcohol, or iodine.  If you have a wound, scrape, corn, or callus on your foot, look at it several times a day to make sure it is healing and not infected. Check for: ? Redness, swelling, or pain. ? Fluid or blood. ? Warmth. ? Pus or a bad smell. General instructions  Do not cross your legs. This may decrease blood flow to your feet.  Do not use heating pads or hot water bottles on your feet. They may burn your skin. If you have lost feeling in your feet or legs, you may not know this is happening until it is too late.  Protect your feet from hot and cold by wearing shoes, such as at the beach or on hot pavement.  Schedule a complete foot exam at least once a year (annually) or more often if you have foot problems. If you have foot problems, report any cuts, sores, or bruises to your health care provider immediately. Contact a health care provider if:  You have a medical condition that increases your risk of infection and you have any cuts, sores, or bruises on your feet.  You have an injury that is not   healing.  You have redness on your legs or feet.  You feel burning or tingling in your legs or feet.  You have pain or cramps in your legs and feet.  Your legs or feet are numb.  Your feet always feel cold.  You have pain around a toenail. Get help right away if:  You have a wound, scrape, corn, or callus on your foot and: ? You have pain, swelling, or redness that gets worse. ? You have fluid or blood coming from the wound, scrape, corn, or callus. ? Your wound, scrape, corn, or callus feels warm to the touch. ? You have pus or a bad smell coming from the wound, scrape, corn, or callus. ? You have a fever. ? You have a red line going up your leg. Summary  Check your feet every day  for cuts, sores, red spots, swelling, and blisters.  Moisturize feet and legs daily.  Wear shoes that fit properly and have enough cushioning.  If you have foot problems, report any cuts, sores, or bruises to your health care provider immediately.  Schedule a complete foot exam at least once a year (annually) or more often if you have foot problems. This information is not intended to replace advice given to you by your health care provider. Make sure you discuss any questions you have with your health care provider. Document Released: 12/27/1999 Document Revised: 02/10/2017 Document Reviewed: 01/31/2016 Elsevier Interactive Patient Education  2019 [employer]  Onychomycosis/Fungal Toenails  WHAT IS IT? An infection that lies within the keratin of your nail plate that is caused by a fungus.  WHY ME? Fungal infections affect all ages, sexes, races, and creeds.  There may be many factors that predispose you to a fungal infection such as age, coexisting medical conditions such as diabetes, or an autoimmune disease; stress, medications, fatigue, genetics, etc.  Bottom line: fungus thrives in a warm, moist environment and your shoes offer such a location.  IS IT CONTAGIOUS? Theoretically, yes.  You do not want to share shoes, nail clippers or files with someone who has fungal toenails.  Walking around barefoot in the same room or sleeping in the same bed is unlikely to transfer the organism.  It is important to realize, however, that fungus can spread easily from one nail to the next on the same foot.  HOW DO WE TREAT THIS?  There are several ways to treat this condition.  Treatment may depend on many factors such as age, medications, pregnancy, liver and kidney conditions, etc.  It is best to ask your doctor which options are available to you.  1. No treatment.   Unlike many other medical concerns, you can live with this condition.  However for many people this can be a painful condition and  may lead to ingrown toenails or a bacterial infection.  It is recommended that you keep the nails cut short to help reduce the amount of fungal nail. 2. Topical treatment.  These range from herbal remedies to prescription strength nail lacquers.  About 40-50% effective, topicals require twice daily application for approximately 9 to 12 months or until an entirely new nail has grown out.  The most effective topicals are medical grade medications available through physicians offices. 3. Oral antifungal medications.  With an 80-90% cure rate, the most common oral medication requires 3 to 4 months of therapy and stays in your system for a year as the new nail grows out.  Oral antifungal medications do require   blood work to make sure it is a safe drug for you.  A liver function panel will be performed prior to starting the medication and after the first month of treatment.  It is important to have the blood work performed to avoid any harmful side effects.  In general, this medication safe but blood work is required. 4. Laser Therapy.  This treatment is performed by applying a specialized laser to the affected nail plate.  This therapy is noninvasive, fast, and non-painful.  It is not covered by insurance and is therefore, out of pocket.  The results have been very good with a 80-95% cure rate.  The [facility] is the only practice in the area to offer this therapy. 5. Permanent Nail Avulsion.  Removing the entire nail so that a new nail will not grow back. 

## 2018-03-24 NOTE — Progress Notes (Signed)
Subjective: Dale Key presents today for preventative diabetic foot care with painful, thick toenails 1-5 b/l that he cannot cut and which interfere with daily activities.  Pain is aggravated when wearing enclosed shoe gear.  Corine Shelter, MD is his PCP.   Current Outpatient Medications:  .  fluticasone (FLONASE) 50 MCG/ACT nasal spray, USE 1 SPRAY(S) IN EACH NOSTRIL TWICE DAILY, Disp: , Rfl:  .  meloxicam (MOBIC) 7.5 MG tablet, Take by mouth., Disp: , Rfl:  .  Vitamin D, Ergocalciferol, (DRISDOL) 1.25 MG (50000 UT) CAPS capsule, Take by mouth., Disp: , Rfl:  .  albuterol (PROVENTIL HFA;VENTOLIN HFA) 108 (90 Base) MCG/ACT inhaler, Inhale into the lungs., Disp: , Rfl:  .  aspirin 81 MG tablet, Take 81 mg by mouth daily., Disp: , Rfl:  .  cefdinir (OMNICEF) 300 MG capsule, TAKE 1 CAPSULE BY MOUTH TWICE DAILY FOR 10 DAYS, Disp: , Rfl:  .  Cholecalciferol (VITAMIN D-1000 MAX ST) 1000 units tablet, Take 1,000 Units by mouth daily., Disp: , Rfl:  .  Coenzyme Q10 10 MG capsule, Take by mouth., Disp: , Rfl:  .  furosemide (LASIX) 40 MG tablet, Take by mouth., Disp: , Rfl:  .  linaclotide (LINZESS) 145 MCG CAPS capsule, Take 145 mcg by mouth daily., Disp: , Rfl:  .  losartan (COZAAR) 100 MG tablet, Take 50 mg by mouth every morning. , Disp: , Rfl:  .  meloxicam (MOBIC) 7.5 MG tablet, Take 7.5 mg by mouth daily., Disp: , Rfl:  .  metFORMIN (GLUCOPHAGE) 1000 MG tablet, Take by mouth., Disp: , Rfl:  .  metoprolol succinate (TOPROL-XL) 50 MG 24 hr tablet, Take 1 tablet (50 mg total) by mouth every morning. Take with or immediately following a meal., Disp: 30 tablet, Rfl: 1 .  Multiple Vitamin (MULTI-VITAMINS) TABS, Take by mouth., Disp: , Rfl:  .  nitrofurantoin, macrocrystal-monohydrate, (MACROBID) 100 MG capsule, TAKE 1 CAPSULE BY MOUTH TWICE DAILY FOR 5 DAYS, Disp: , Rfl:  .  potassium chloride SA (K-DUR,KLOR-CON) 20 MEQ tablet, Take 1 tablet (20 mEq total) by mouth daily as needed (For  potassium loss.)., Disp: 30 tablet, Rfl: 0 .  predniSONE (DELTASONE) 20 MG tablet, 3 tabs po day one, then 2 po daily x 4 days, Disp: 11 tablet, Rfl: 0 .  rosuvastatin (CRESTOR) 10 MG tablet, Take 10 mg by mouth daily., Disp: , Rfl:  .  sulfamethoxazole-trimethoprim (BACTRIM DS,SEPTRA DS) 800-160 MG tablet, Take 1 tablet by mouth 2 (two) times daily. for 10 days, Disp: , Rfl:  .  UNABLE TO FIND, Take by mouth., Disp: , Rfl:  .  vitamin C (ASCORBIC ACID) 500 MG tablet, Take by mouth., Disp: , Rfl:  .  Vitamin D, Ergocalciferol, (DRISDOL) 1.25 MG (50000 UT) CAPS capsule, TAKE ONE CAPSULE BY MOUTH ONCE A WEEK. WHEN FINISHED WITH THIS PRESCRIPTION PT SHOULD START TAKING OTC VITAMIN D3 1000 UNITS DAILY FOR 8 DOS, Disp: , Rfl:   Allergies  Allergen Reactions  . Sulfamethoxazole-Trimethoprim Other (See Comments) and Diarrhea    Objective:  Vascular Examination: Capillary refill time immediate x 10 digits  Dorsalis pedis and Posterior tibial pulses faintly palpable bilaterally.  Digital hair absent x 10 digits.  Skin temperature gradient WNL b/l  Dermatological Examination: Skin with normal turgor, texture and tone b/l  Toenails 1-5 b/l discolored, thick, dystrophic with subungual debris and pain with palpation to nailbeds due to thickness of nails.  Hyperkeratotic lesion noted distal tip of left third digit, submetatarsal head 1  bilaterally, and plantar medial aspect of hallux bilaterally.  No erythema, no edema, no drainage, no flocculence noted.  Musculoskeletal: Muscle strength 5/5 to all LE muscle groups  Mild hammertoe deformity noted digits 2nd, 3rd digits bilaterally  No pain, crepitus or joint limitation noted with ROM.   Neurological: Sensation intact with 10 gram monofilament. Vibratory sensation intact.  Assessment: Painful onychomycosis toenails 1-5 b/l  Corn distal tip left 3rd digit Callus submet head 1 b/l and b/l hallux  Plan: 1. Toenails 1-5 b/l were debrided  in length and girth without iatrogenic bleeding. 2. Corn left 3rd digit and calluses submet head 1 b/l and hallux b/l. 3. Patient to continue soft, supportive shoe gear.  Discussed diabetic shoe benefit with patient.  Patient with possible for 20% with discomfort about $80.  He would like to think about it. 4. Patient to report any pedal injuries to medical professional immediately. 5. Follow up 3 months.  6. Patient/POA to call should there be a concern in the interim.

## 2018-05-10 DIAGNOSIS — N390 Urinary tract infection, site not specified: Secondary | ICD-10-CM | POA: Insufficient documentation

## 2018-05-10 DIAGNOSIS — N35913 Unspecified membranous urethral stricture, male: Secondary | ICD-10-CM | POA: Insufficient documentation

## 2018-06-02 ENCOUNTER — Ambulatory Visit: Payer: Medicare Other | Admitting: Podiatry

## 2018-06-08 DIAGNOSIS — B9689 Other specified bacterial agents as the cause of diseases classified elsewhere: Secondary | ICD-10-CM | POA: Insufficient documentation

## 2018-06-22 ENCOUNTER — Other Ambulatory Visit: Payer: Self-pay

## 2018-06-22 ENCOUNTER — Ambulatory Visit (INDEPENDENT_AMBULATORY_CARE_PROVIDER_SITE_OTHER): Payer: Medicare Other | Admitting: Podiatry

## 2018-06-22 ENCOUNTER — Encounter: Payer: Self-pay | Admitting: Podiatry

## 2018-06-22 VITALS — Temp 97.7°F

## 2018-06-22 DIAGNOSIS — B351 Tinea unguium: Secondary | ICD-10-CM | POA: Diagnosis not present

## 2018-06-22 DIAGNOSIS — M79674 Pain in right toe(s): Secondary | ICD-10-CM | POA: Diagnosis not present

## 2018-06-22 DIAGNOSIS — M79675 Pain in left toe(s): Secondary | ICD-10-CM | POA: Diagnosis not present

## 2018-06-22 DIAGNOSIS — E119 Type 2 diabetes mellitus without complications: Secondary | ICD-10-CM | POA: Diagnosis not present

## 2018-06-22 DIAGNOSIS — L84 Corns and callosities: Secondary | ICD-10-CM | POA: Diagnosis not present

## 2018-06-22 NOTE — Patient Instructions (Signed)
Diabetes Mellitus and Foot Care  Foot care is an important part of your health, especially when you have diabetes. Diabetes may cause you to have problems because of poor blood flow (circulation) to your feet and legs, which can cause your skin to:   Become thinner and drier.   Break more easily.   Heal more slowly.   Peel and crack.  You may also have nerve damage (neuropathy) in your legs and feet, causing decreased feeling in them. This means that you may not notice minor injuries to your feet that could lead to more serious problems. Noticing and addressing any potential problems early is the best way to prevent future foot problems.  How to care for your feet  Foot hygiene   Wash your feet daily with warm water and mild soap. Do not use hot water. Then, pat your feet and the areas between your toes until they are completely dry. Do not soak your feet as this can dry your skin.   Trim your toenails straight across. Do not dig under them or around the cuticle. File the edges of your nails with an emery board or nail file.   Apply a moisturizing lotion or petroleum jelly to the skin on your feet and to dry, brittle toenails. Use lotion that does not contain alcohol and is unscented. Do not apply lotion between your toes.  Shoes and socks   Wear clean socks or stockings every day. Make sure they are not too tight. Do not wear knee-high stockings since they may decrease blood flow to your legs.   Wear shoes that fit properly and have enough cushioning. Always look in your shoes before you put them on to be sure there are no objects inside.   To break in new shoes, wear them for just a few hours a day. This prevents injuries on your feet.  Wounds, scrapes, corns, and calluses   Check your feet daily for blisters, cuts, bruises, sores, and redness. If you cannot see the bottom of your feet, use a mirror or ask someone for help.   Do not cut corns or calluses or try to remove them with medicine.   If you  find a minor scrape, cut, or break in the skin on your feet, keep it and the skin around it clean and dry. You may clean these areas with mild soap and water. Do not clean the area with peroxide, alcohol, or iodine.   If you have a wound, scrape, corn, or callus on your foot, look at it several times a day to make sure it is healing and not infected. Check for:  ? Redness, swelling, or pain.  ? Fluid or blood.  ? Warmth.  ? Pus or a bad smell.  General instructions   Do not cross your legs. This may decrease blood flow to your feet.   Do not use heating pads or hot water bottles on your feet. They may burn your skin. If you have lost feeling in your feet or legs, you may not know this is happening until it is too late.   Protect your feet from hot and cold by wearing shoes, such as at the beach or on hot pavement.   Schedule a complete foot exam at least once a year (annually) or more often if you have foot problems. If you have foot problems, report any cuts, sores, or bruises to your health care provider immediately.  Contact a health care provider if:     You have a medical condition that increases your risk of infection and you have any cuts, sores, or bruises on your feet.   You have an injury that is not healing.   You have redness on your legs or feet.   You feel burning or tingling in your legs or feet.   You have pain or cramps in your legs and feet.   Your legs or feet are numb.   Your feet always feel cold.   You have pain around a toenail.  Get help right away if:   You have a wound, scrape, corn, or callus on your foot and:  ? You have pain, swelling, or redness that gets worse.  ? You have fluid or blood coming from the wound, scrape, corn, or callus.  ? Your wound, scrape, corn, or callus feels warm to the touch.  ? You have pus or a bad smell coming from the wound, scrape, corn, or callus.  ? You have a fever.  ? You have a red line going up your leg.  Summary   Check your feet every day  for cuts, sores, red spots, swelling, and blisters.   Moisturize feet and legs daily.   Wear shoes that fit properly and have enough cushioning.   If you have foot problems, report any cuts, sores, or bruises to your health care provider immediately.   Schedule a complete foot exam at least once a year (annually) or more often if you have foot problems.  This information is not intended to replace advice given to you by your health care provider. Make sure you discuss any questions you have with your health care provider.  Document Released: 12/27/1999 Document Revised: 02/10/2017 Document Reviewed: 01/31/2016  [employer] Interactive Patient Education  2019 [employer]

## 2018-06-24 DIAGNOSIS — Z7409 Other reduced mobility: Secondary | ICD-10-CM | POA: Insufficient documentation

## 2018-07-06 NOTE — Progress Notes (Addendum)
Subjective:  Dale Key presents to clinic for preventative diabetic foot care on today with cc of painful, thick, discolored, elongated toenails 1-5 b/l that become tender and cannot cut because of thickness.  Pain is aggravated when wearing enclosed shoe gear and relieved with periodic professional debridement.  Patient states feet are swollen and admits to not taking his "fluid pill" everyday as prescribed.  Corine ShelterKilpatrick, George, MD is his PCP.   Current Outpatient Medications:  .  albuterol (PROVENTIL HFA;VENTOLIN HFA) 108 (90 Base) MCG/ACT inhaler, Inhale into the lungs., Disp: , Rfl:  .  aspirin 81 MG tablet, Take 81 mg by mouth daily., Disp: , Rfl:  .  cefdinir (OMNICEF) 300 MG capsule, TAKE 1 CAPSULE BY MOUTH TWICE DAILY FOR 10 DAYS, Disp: , Rfl:  .  Cholecalciferol (VITAMIN D-1000 MAX ST) 1000 units tablet, Take 1,000 Units by mouth daily., Disp: , Rfl:  .  Coenzyme Q10 10 MG capsule, Take by mouth., Disp: , Rfl:  .  fluticasone (FLONASE) 50 MCG/ACT nasal spray, USE 1 SPRAY(S) IN EACH NOSTRIL TWICE DAILY, Disp: , Rfl:  .  fosfomycin (MONUROL) 3 g PACK, Take by mouth., Disp: , Rfl:  .  furosemide (LASIX) 40 MG tablet, Take by mouth., Disp: , Rfl:  .  linaclotide (LINZESS) 145 MCG CAPS capsule, Take 145 mcg by mouth daily., Disp: , Rfl:  .  losartan (COZAAR) 100 MG tablet, Take 50 mg by mouth every morning. , Disp: , Rfl:  .  meloxicam (MOBIC) 7.5 MG tablet, Take by mouth., Disp: , Rfl:  .  meloxicam (MOBIC) 7.5 MG tablet, Take 7.5 mg by mouth daily., Disp: , Rfl:  .  metFORMIN (GLUCOPHAGE) 1000 MG tablet, Take by mouth., Disp: , Rfl:  .  metoprolol succinate (TOPROL-XL) 50 MG 24 hr tablet, Take 1 tablet (50 mg total) by mouth every morning. Take with or immediately following a meal., Disp: 30 tablet, Rfl: 1 .  Multiple Vitamin (MULTI-VITAMINS) TABS, Take by mouth., Disp: , Rfl:  .  nitrofurantoin, macrocrystal-monohydrate, (MACROBID) 100 MG capsule, TAKE 1 CAPSULE BY MOUTH TWICE  DAILY FOR 5 DAYS, Disp: , Rfl:  .  potassium chloride SA (K-DUR,KLOR-CON) 20 MEQ tablet, Take 1 tablet (20 mEq total) by mouth daily as needed (For potassium loss.)., Disp: 30 tablet, Rfl: 0 .  predniSONE (DELTASONE) 20 MG tablet, 3 tabs po day one, then 2 po daily x 4 days, Disp: 11 tablet, Rfl: 0 .  rosuvastatin (CRESTOR) 10 MG tablet, Take 10 mg by mouth daily., Disp: , Rfl:  .  sulfamethoxazole-trimethoprim (BACTRIM DS,SEPTRA DS) 800-160 MG tablet, Take 1 tablet by mouth 2 (two) times daily. for 10 days, Disp: , Rfl:  .  UNABLE TO FIND, Take by mouth., Disp: , Rfl:  .  vitamin C (ASCORBIC ACID) 500 MG tablet, Take by mouth., Disp: , Rfl:  .  Vitamin D, Ergocalciferol, (DRISDOL) 1.25 MG (50000 UT) CAPS capsule, TAKE ONE CAPSULE BY MOUTH ONCE A WEEK. WHEN FINISHED WITH THIS PRESCRIPTION PT SHOULD START TAKING OTC VITAMIN D3 1000 UNITS DAILY FOR 8 DOS, Disp: , Rfl:    Allergies  Allergen Reactions  . Sulfamethoxazole-Trimethoprim Other (See Comments) and Diarrhea     Objective: Vitals:   06/22/18 1117  Temp: 97.7 F (36.5 C)    Physical Examination:  Vascular Examination: Capillary refill time immediate x 10 digits.  Palpable DP/PT pulses faintly palpable b/l.  Digital hair absent b/l.  Mild pedal edema noted b/l. No pain with calf compression. No warmth  or erythema b/l.  Skin temperature gradient WNL b/l.  Dermatological Examination: Skin with normal turgor, texture and tone b/l.  Hyperkeratotic lesion submet head 1 b/l, distal tip left 3rd digit, b/l hallux with tenderness to palpation. No edema, no erythema, no drainage, no flocculence.  No open wounds b/l.  No interdigital macerations noted b/l.  Elongated, thick, discolored brittle toenails with subungual debris and pain on dorsal palpation of nailbeds 1-5 b/l.  Musculoskeletal Examination: Muscle strength 5/5 to all muscle groups b/l.  Mild hammertoe deformity 2nd and 3rd digits b/l.  No pain, crepitus or  joint discomfort with active/passive ROM.  Neurological Examination: Sensation intact 5/5 b/l with 10 gram monofilament.  Vibratory sensation intact b/l.  Proprioceptive sensation intact b/l.  Assessment: Mycotic nail infection with pain 1-5 b/l Corn distal tip left 3rd digit Callus b/l hallux submet head 1 b/l NIDDM  Plan: 1. Continue diabetic foot care principles daily. 2. Encouraged patient to comply with diuretic medication which assists in controlling his pedal edema. 3. Toenails 1-5 b/l were debrided in length and girth without iatrogenic laceration. Calluses pared submetatarsal head(s) 1 b/l and b/l hallux utilizing sterile scalpel blade without incident.Corn(s) pared left 3rd digit utilizing sterile scalpel blade without incident. 2.  Continue soft, supportive shoe gear daily. 3.  Report any pedal injuries to medical professional. 4.  Follow up 3 months. 5.  Patient/POA to call should there be a question/concern in there interim.

## 2018-07-18 ENCOUNTER — Ambulatory Visit: Payer: Medicare Other | Attending: Physical Therapy | Admitting: Physical Therapy

## 2018-07-19 ENCOUNTER — Other Ambulatory Visit: Payer: Self-pay | Admitting: *Deleted

## 2018-07-19 DIAGNOSIS — Z20822 Contact with and (suspected) exposure to covid-19: Secondary | ICD-10-CM

## 2018-07-25 LAB — NOVEL CORONAVIRUS, NAA: SARS-CoV-2, NAA: NOT DETECTED

## 2018-08-17 ENCOUNTER — Other Ambulatory Visit: Payer: Self-pay

## 2018-08-17 ENCOUNTER — Telehealth: Payer: Self-pay | Admitting: Physical Therapy

## 2018-08-17 ENCOUNTER — Ambulatory Visit: Payer: Medicare Other | Attending: Internal Medicine | Admitting: Physical Therapy

## 2018-08-17 DIAGNOSIS — R2681 Unsteadiness on feet: Secondary | ICD-10-CM | POA: Insufficient documentation

## 2018-08-17 DIAGNOSIS — M6281 Muscle weakness (generalized): Secondary | ICD-10-CM | POA: Insufficient documentation

## 2018-08-17 DIAGNOSIS — R2689 Other abnormalities of gait and mobility: Secondary | ICD-10-CM | POA: Insufficient documentation

## 2018-08-17 NOTE — Therapy (Signed)
[facility] [address] [place], [place], [address] Phone: [phone]   Fax:  [phone]  Physical Therapy Evaluation  Patient Details  Name: Dale Key MRN: [id number] Date of Birth: Apr 15, 1955 Referring Provider (PT): Barbette ReichmannHande, Vishwanath MD   Encounter Date: 08/17/2018  CLINIC OPERATION CHANGES: Outpatient Neuro Rehab is open at lower capacity following universal masking, social distancing, and patient screening.  The patient's COVID risk of complications score is 5.   PT End of Session - 08/17/18 1203    Visit Number  1    Number of Visits  17    Date for PT Re-Evaluation  11/15/18    Authorization Type  UHC Medicare/Medicaid    PT Start Time  1020    PT Stop Time  1103    PT Time Calculation (min)  43 min    Activity Tolerance  Patient tolerated treatment well    Behavior During Therapy  WFL for tasks assessed/performed       Past Medical History:  Diagnosis Date  . Diabetes mellitus without complication (HCC)   . Glaucoma   . Hyperlipidemia   . Hypertension   . Tracheostomy present [facility](HCC)     Past Surgical History:  Procedure Laterality Date  . ANTERIOR CERVICAL DECOMP/DISCECTOMY FUSION N/A 10/07/2012   Procedure: ANTERIOR CERVICAL DECOMPRESSION/DISCECTOMY FUSION 2 LEVELS;  Surgeon: Karn CassisErnesto M Botero, MD;  [facility];  Service: Neurosurgery;  Laterality: N/A;  Anterior Cervical Three-Four/Four-Five Decompression and Fusion  . EYE SURGERY    . FOOT SURGERY Left    Pt states a tendon was removed to replace one in right hand.  Marland Kitchen. HAND SURGERY Right   . TRACHEOSTOMY      There were no vitals filed for this visit.   Subjective Assessment - 08/17/18 1028    Subjective  Shoulders, hands seem to be getting weaker, harder to raise my legs.  I was going to [employer] prior to Covid, and since I've stopped, I've really gone down hill fast.  When getting down to the ground, I had to get up very slowly.   Use the cane for mobility.  No falls in the past 6 months.    Pertinent History  Hx TBI with central cord syndrome in 2014, with subsequent spinal cord compression and surgery; arthritis    Patient Stated Goals  Pt's goal for therapy is to be able to touch my head with my hands, raise legs better to get into car    Currently in Pain?  No/denies         [facility] PT Assessment - 08/17/18 1034      Assessment   Medical Diagnosis  OA of mutliple sites    Referring Provider (PT)  Barbette ReichmannHande, Vishwanath MD    Onset Date/Surgical Date  --   March 2020-increased weakness   Hand Dominance  Right      Precautions   Precautions  None      Balance Screen   Has the patient fallen in the past 6 months  No    Has the patient had a decrease in activity level because of a fear of falling?   Yes    Is the patient reluctant to leave their home because of a fear of falling?   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   Leisure  Enjoys Education officer, communityplaying piano and guitar, was going to [employer] daily prior to COVID-19      Observation/Other Assessments   Focus on Therapeutic Outcomes (FOTO)   NA      ROM / Strength   AROM / PROM / Strength  Strength      Strength   Overall Strength  Deficits    Strength Assessment Site  Hip;Knee;Ankle    Right/Left Hip  Right;Left    Right Hip Flexion  3-/5    Left Hip Flexion  3/5    Right/Left Knee  Right;Left    Right Knee Flexion  4/5    Right Knee Extension  4/5    Left Knee Flexion  4/5    Left Knee Extension  4/5    Right/Left Ankle  Right;Left    Right Ankle Dorsiflexion   3-/5    Left Ankle Dorsiflexion  3-/5      Transfers   Transfers  Sit to Stand;Stand to Sit    Sit to Stand  6: Modified independent (Device/Increase time);With upper extremity assist;Without upper extremity assist;From chair/3-in-1    Five time sit to stand comments   16.87   with UE support   Stand to Sit  6: Modified independent (Device/Increase time);With upper extremity assist;With armrests;To chair/3-in-1      Ambulation/Gait   Ambulation/Gait  Yes    Ambulation/Gait Assistance  6: Modified independent (Device/Increase time)    Ambulation Distance (Feet)  200 Feet    Assistive device  Straight cane    Gait Pattern  Step-through pattern;Decreased dorsiflexion - right;Decreased dorsiflexion - left;Lateral hip instability;Wide base of support;Poor foot clearance - left;Poor foot clearance - right    Ambulation Surface  Level;Indoor    Gait velocity  15.78 sec = 2.08 ft/sec      Standardized Balance Assessment   Standardized Balance Assessment  Timed Up and Go Test;Dynamic Gait Index      Dynamic Gait Index   Level Surface  Mild Impairment    Change in Gait Speed  Mild Impairment    Gait with Horizontal Head Turns  Moderate Impairment    Gait with Vertical Head Turns  Moderate Impairment    Gait and Pivot Turn  Mild Impairment    Step Over Obstacle  Moderate Impairment    Step Around Obstacles  Mild Impairment    Steps  Mild Impairment    Total Score  13    DGI comment:  Scores <19/24 indicate increased fall risk      Timed Up and Go Test   TUG  Normal TUG    Normal TUG (seconds)  17.66    TUG Comments  Scores >13.5 sec indicate increased fall risk                Objective measurements completed on examination: See above findings.                PT Short Term Goals - 08/17/18 1210      PT SHORT TERM GOAL #1   Title  Pt  will be independent with HEP for improved strength, balance, gait.  TARGET 09/16/2018    Time  4    Period  Weeks    Status  New     Target Date  09/16/18      PT SHORT TERM GOAL #2   Title  Pt will improve 5x sit<>stand score to less than or equal to 13 seconds, with UE support, for improved transfer efficiency and lower extremity strength.    Time  4    Period  Weeks    Status  New    Target Date  09/16/18      PT SHORT TERM GOAL #3   Title  Pt will improve score to less than or equal to 15 seconds for decreased fall risk.    Time  4    Period  Weeks    Status  New    Target Date  09/16/18      PT SHORT TERM GOAL #4   Title  Pt will negotiatie at least 4 steps, step through pattern, modified independently for improved safe stair negotiation.    Time  4    Period  Weeks    Status  New    Target Date  09/16/18      PT SHORT TERM GOAL #5   Title  Pt will verbalize understanding of fall prevention in home environment.    Time  4    Period  Weeks    Status  New    Target Date  09/16/18        PT Long Term Goals - 08/17/18 1214      PT LONG TERM GOAL #1   Title  Pt will be independent with progression of HEP for improved strength, balance, gait.  TARGET 10/14/2018    Time  8    Period  Weeks    Status  New    Target Date  10/14/18      PT LONG TERM GOAL #2   Title  Pt will report improved ease of transfers from <18" surfaces by at least 50%, demonstrating improved lower extremity strength.    Time  8    Period  Weeks    Status  New    Target Date  10/14/18      PT LONG TERM GOAL #3   Title  Pt will improve TUG score to less than or equal to 13.5 sec for decreased fall risk.    Time  8    Period  Weeks    Status  New    Target Date  10/14/18      PT LONG TERM GOAL #4   Title  Pt will improve DGI score to at least 19/24 for decreased fall risk.    Time  8    Period  Weeks    Status  New    Target Date  10/14/18      PT LONG TERM GOAL #5   Title  Pt will improve gait velocity to at least 2.62 ft/sec for improved gait efficiency and safety in the community.    Time  8    Period  Weeks     Status  New    Target Date  10/14/18             Plan - 08/17/18 1204    Clinical Impression Statement  Pt is a 63 year old male who presents to OPPT with dx of OA multiple joints, with history of  spinal cord compression, central cord sydrome s/p surgery in 2014.  He feels that over the past 6 months, he has noted decreased overall strength, not being able to work out at gym, having difficulty with lifting foot to get into car and difficulty with ADLs due to shoulder pain and range of motion limitations.  Pt is at risk for falls per TUG and DGI scores and demonstrates slowed gait velocity/limited community ambulator status.  Pt will benefit from skilled PT to address the above stated deficits to help patient decrease risk of falls, improve funcitonal moiblity and improve participation in exercise and mechanics activities.    Personal Factors and Comorbidities  Comorbidity 3+    Comorbidities  Hx of TBI, spinal cord compression, central cord syndrome, peripheral neuropathy, bilateral carpal tunnel syndrome    Examination-Activity Limitations  Dressing;Reach Overhead;Locomotion Level;Transfers    Examination-Participation Restrictions  Community Activity;Other   Curator work   Optometrist  Moderate    Rehab Potential  Good    PT Frequency  2x / week    PT Duration  8 weeks   plus eval   PT Treatment/Interventions  ADLs/Self Care Home Management;Gait training;Neuromuscular re-education;Balance training;Therapeutic exercise;Therapeutic activities;Functional mobility training;Stair training;Patient/family education    PT Next Visit Plan  Initiate HEP for hip and lower extremity strengthening; balance exercises; functional activities:  getting legs into and out of truck, controlled step downs for stair negotiation; PT to ask for OT referral-please check on this and schedule    Recommended Other Services  OT evaluation recommended, due to limitations in L shoulder ROM and strength     Consulted and Agree with Plan of Care  Patient       Patient will benefit from skilled therapeutic intervention in order to improve the following deficits and impairments:  Abnormal gait, Decreased balance, Decreased mobility, Decreased strength, Difficulty walking, Impaired flexibility  Visit Diagnosis: 1. Other abnormalities of gait and mobility   2. Unsteadiness on feet   3. Muscle weakness (generalized)        Problem List Patient Active Problem List   Diagnosis Date Noted  . Urinary tract infection due to ESBL Klebsiella 06/08/2018  . Recurrent UTI 05/10/2018  . 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 BMI 40.0-44.9, adult (HCC) 11/25/2017  . Cervical disc disease with myelopathy 11/15/2017  . Anemia 10/20/2017  . Benign essential hypertension 10/20/2017  . Constipation 10/20/2017  . Controlled type 2 diabetes mellitus with hyperglycemia, without long-term current use of insulin (HCC) 10/20/2017  . Generalized osteoarthritis of multiple sites 10/20/2017  . Hoarseness or changing voice 10/20/2017  . 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 Upper extremity weakness 06/16/2017  . S/P cervical spinal fusion 04/29/2017  . Bilateral lower extremity edema 01/09/2017  . 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 Bilateral vocal cord paralysis 12/22/2015  . Vocal cord paralysis, unilateral complete 12/22/2015  . Oropharyngeal dysphagia 12/02/2015  . Elevated troponin level 11/19/2015  . Hypertension 11/14/2015  . Type 2 diabetes mellitus, without long-term current use of insulin (HCC) 11/14/2015  . BPH (benign prostatic hyperplasia) 06/14/2014  . Epididymitis, right 05/17/2014  . Hematuria 05/17/2014  . Swollen testicle 05/17/2014  . Testis mass 05/17/2014  . Gross hematuria 10/19/2013  . Central cord syndrome (HCC) 10/13/2012    Carollyn Etcheverry W. 08/17/2018, 12:18 PM  Gean Maidens., PT   [facility] [address] [place], [place], [address] Phone: [phone]  Fax:  [phone]  Name: Dale Key MRN: [id number] Date of Birth: 06-19-1955

## 2018-08-17 NOTE — Telephone Encounter (Signed)
Dr. Ginette Pitman, I saw Dale Key today for a PT evaluation, and I plan to see him to address stregnth, balance, gait.  He is having difficulty with upper extremity range of motion and flexibility, which is limiting his ADLs.  He would benefit from occupational therapy (OT) evaluation to assess his UE limitations in relation to activities of daily living.  If you agree, could you please send order via Epic for OT evaluation and treatment?  Thank you.  Mady Haagensen, PT 08/17/18 12:23 PM Phone: [phone] Fax: [phone]

## 2018-08-22 ENCOUNTER — Other Ambulatory Visit: Payer: Self-pay

## 2018-08-22 ENCOUNTER — Ambulatory Visit: Payer: Medicare Other | Admitting: Physical Therapy

## 2018-08-22 DIAGNOSIS — M6281 Muscle weakness (generalized): Secondary | ICD-10-CM

## 2018-08-22 DIAGNOSIS — R2689 Other abnormalities of gait and mobility: Secondary | ICD-10-CM

## 2018-08-22 DIAGNOSIS — R2681 Unsteadiness on feet: Secondary | ICD-10-CM

## 2018-08-22 NOTE — Patient Instructions (Signed)
Access Code: [email or web address]

## 2018-08-22 NOTE — Therapy (Signed)
[facility] [place] [address] [place], [place], [address] Phone: [phone]   Fax:  [phone]  Physical Therapy Treatment  Patient Details  Name: Dale Key MRN: [id number] Date of Birth: Feb 15, 1955 Referring Provider (PT): Tracie Harrier MD   Encounter Date: 08/22/2018  PT End of Session - 08/22/18 0925    Visit Number  2    Number of Visits  17    Date for PT Re-Evaluation  11/15/18    Authorization Type  UHC Medicare/Medicaid    PT Start Time  [phone]    PT Stop Time  0930    PT Time Calculation (min)  40 min    Activity Tolerance  Patient tolerated treatment well    Behavior During Therapy  [facility] for tasks assessed/performed       Past Medical History:  Diagnosis Date  . Diabetes mellitus without complication ([place])   . Glaucoma   . Hyperlipidemia   . Hypertension   . Tracheostomy present [facility])     Past Surgical History:  Procedure Laterality Date  . ANTERIOR CERVICAL DECOMP/DISCECTOMY FUSION N/A 10/07/2012   Procedure: ANTERIOR CERVICAL DECOMPRESSION/DISCECTOMY FUSION 2 LEVELS;  Surgeon: Floyce Stakes, MD;  Location: [facility];  Service: Neurosurgery;  Laterality: N/A;  Anterior Cervical Three-Four/Four-Five Decompression and Fusion  . EYE SURGERY    . FOOT SURGERY Left    Pt states a tendon was removed to replace one in right hand.  Marland Kitchen HAND SURGERY Right   . TRACHEOSTOMY      There were no vitals filed for this visit.  Subjective Assessment - 08/22/18 0859    Subjective  feels "okay I guess", when asked about pain he says "I took a diuretic"    Pertinent History  Hx TBI with central cord syndrome in 2014, with subsequent spinal cord compression and surgery; arthritis    Patient Stated Goals  Pt's goal for therapy is to be able to touch my head with my hands, raise legs better to get into car    Currently in Pain?  Other (Comment)   does not rate                      [facility] Adult PT  Treatment/Exercise - 08/22/18 0001      Ambulation/Gait   Ambulation/Gait  Yes    Ambulation Distance (Feet)  225 Feet with supervision   Assistive device  --   held cane but did not use it to challenge balance     Exercises   Exercises  Knee/Hip      Knee/Hip Exercises: Stretches   Active Hamstring Stretch  Right;Left;2 reps;30 seconds    Active Hamstring Stretch Limitations  sitting      Knee/Hip Exercises: Aerobic   Nustep  L3 UE/LE  x6 min warm up     Access Code: [email or web address]  URL: [email or web address]  Date: 08/22/2018  Prepared by: Elsie Ra   Exercises  Seated Hamstring Stretch - 2 sets - 30 hold  Supine Bridge - 10 reps - Sit to Stand - 10 reps  Step Up - 10 reps bilat Heel Toe Raises with Unilateral Counter Support - 15 reps   Standing Tandem Balance with no UE support 3 sets   Standing Single Leg Stance with Unilateral Counter Support - 3 reps -       PT Education - 08/22/18 0924    Education Details  HEP    Person(s) Educated  Patient  Methods  Explanation    Comprehension  Verbalized understanding;Returned demonstration;Need further instruction       PT Short Term Goals - 08/17/18 1210      PT SHORT TERM GOAL #1   Title  Pt will be independent with HEP for improved strength, balance, gait.  TARGET 09/16/2018    Time  4    Period  Weeks    Status  New    Target Date  09/16/18      PT SHORT TERM GOAL #2   Title  Pt will improve 5x sit<>stand score to less than or equal to 13 seconds, with UE support, for improved transfer efficiency and lower extremity strength.    Time  4    Period  Weeks    Status  New    Target Date  09/16/18      PT SHORT TERM GOAL #3   Title  Pt will improve score to less than or equal to 15 seconds for decreased fall risk.    Time  4    Period  Weeks    Status  New    Target Date  09/16/18      PT SHORT TERM GOAL #4   Title  Pt will negotiatie at least 4 steps, step through pattern, modified  independently for improved safe stair negotiation.    Time  4    Period  Weeks    Status  New    Target Date  09/16/18      PT SHORT TERM GOAL #5   Title  Pt will verbalize understanding of fall prevention in home environment.    Time  4    Period  Weeks    Status  New    Target Date  09/16/18        PT Long Term Goals - 08/17/18 1214      PT LONG TERM GOAL #1   Title  Pt will be independent with progression of HEP for improved strength, balance, gait.  TARGET 10/14/2018    Time  8    Period  Weeks    Status  New    Target Date  10/14/18      PT LONG TERM GOAL #2   Title  Pt will report improved ease of transfers from <18" surfaces by at least 50%, demonstrating improved lower extremity strength.    Time  8    Period  Weeks    Status  New    Target Date  10/14/18      PT LONG TERM GOAL #3   Title  Pt will improve TUG score to less than or equal to 13.5 sec for decreased fall risk.    Time  8    Period  Weeks    Status  New    Target Date  10/14/18      PT LONG TERM GOAL #4   Title  Pt will improve DGI score to at least 19/24 for decreased fall risk.    Time  8    Period  Weeks    Status  New    Target Date  10/14/18      PT LONG TERM GOAL #5   Title  Pt will improve gait velocity to at least 2.62 ft/sec for improved gait efficiency and safety in the community.    Time  8    Period  Weeks    Status  New    Target Date  10/14/18  Plan - 08/22/18 0929    Clinical Impression Statement  Session focused on establishing and reviewing HEP for general LE strengthening and balance training. He showed good understanding and return demonstration of all exercises. He still does not have OT referral and if he does not get one, PT will reach back out to MD or add in more functional shoulder exercises into his POC.    Personal Factors and Comorbidities  Comorbidity 3+    Comorbidities  Hx of TBI, spinal cord compression, central cord syndrome, peripheral  neuropathy, bilateral carpal tunnel syndrome    Examination-Activity Limitations  Dressing;Reach Overhead;Locomotion Level;Transfers    Examination-Participation Restrictions  Community Activity;Other   Curatormechanic work   Presenter, broadcastingehab Potential  Good    PT Frequency  2x / week    PT Duration  8 weeks   plus eval   PT Treatment/Interventions  ADLs/Self Care Home Management;Gait training;Neuromuscular re-education;Balance training;Therapeutic exercise;Therapeutic activities;Functional mobility training;Stair training;Patient/family education    PT Next Visit Plan  lower extremity strengthening; balance exercises; functional activities:  getting legs into and out of truck, controlled step downs for stair negotiation; PT to ask for OT referral-please check on this and schedule    Consulted and Agree with Plan of Care  Patient       Patient will benefit from skilled therapeutic intervention in order to improve the following deficits and impairments:  Abnormal gait, Decreased balance, Decreased mobility, Decreased strength, Difficulty walking, Impaired flexibility  Visit Diagnosis: 1. Other abnormalities of gait and mobility   2. Unsteadiness on feet   3. Muscle weakness (generalized)        Problem List Patient Active Problem List   Diagnosis Date Noted  . Urinary tract infection due to ESBL Klebsiella 06/08/2018  . Recurrent UTI 05/10/2018  . 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 BMI 40.0-44.9, adult (HCC) 11/25/2017  . Cervical disc disease with myelopathy 11/15/2017  . Anemia 10/20/2017  . Benign essential hypertension 10/20/2017  . Constipation 10/20/2017  . Controlled type 2 diabetes mellitus with hyperglycemia, without long-term current use of insulin (HCC) 10/20/2017  . Generalized osteoarthritis of multiple sites 10/20/2017  . Hoarseness or changing voice 10/20/2017  . 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 Upper extremity weakness 06/16/2017  . S/P cervical spinal fusion  04/29/2017  . Bilateral lower extremity edema 01/09/2017  . 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 Bilateral vocal cord paralysis 12/22/2015  . Vocal cord paralysis, unilateral complete 12/22/2015  . Oropharyngeal dysphagia 12/02/2015  . Elevated troponin level 11/19/2015  . Hypertension 11/14/2015  . Type 2 diabetes mellitus, without long-term current use of insulin (HCC) 11/14/2015  . BPH (benign prostatic hyperplasia) 06/14/2014  . Epididymitis, right 05/17/2014  . Hematuria 05/17/2014  . Swollen testicle 05/17/2014  . Testis mass 05/17/2014  . Gross hematuria 10/19/2013  . Central cord syndrome [facility](HCC) 10/13/2012    Birdie RiddleBrian R Nelson,PT,DPT 08/22/2018, 9:32 AM  [facility] [address] [place], [place], [address] Phone: [phone]   Fax:  [phone]  Name: Annette StableGirrie Pinkney MRN: [id number] Date of Birth: [birth year]

## 2018-08-24 ENCOUNTER — Other Ambulatory Visit: Payer: Self-pay

## 2018-08-24 ENCOUNTER — Ambulatory Visit: Payer: Medicare Other | Admitting: Physical Therapy

## 2018-08-24 DIAGNOSIS — R2681 Unsteadiness on feet: Secondary | ICD-10-CM

## 2018-08-24 DIAGNOSIS — R2689 Other abnormalities of gait and mobility: Secondary | ICD-10-CM

## 2018-08-24 DIAGNOSIS — M6281 Muscle weakness (generalized): Secondary | ICD-10-CM

## 2018-08-24 NOTE — Therapy (Signed)
[facility] [address] [place], [place], [address] Phone: [phone]   Fax:  [phone]  Physical Therapy Treatment  Patient Details  Name: Dale Key MRN: [id number] Date of Birth: 06/12/1955 Referring Provider (PT): Barbette ReichmannHande, Vishwanath MD   Encounter Date: 08/24/2018  PT End of Session - 08/24/18 1154    Visit Number  3    Number of Visits  17    Date for PT Re-Evaluation  11/15/18    Authorization Type  UHC Medicare/Medicaid    PT Start Time  0935    PT Stop Time  1015    PT Time Calculation (min)  40 min    Activity Tolerance  Patient tolerated treatment well    Behavior During Therapy  [facility] for tasks assessed/performed       Past Medical History:  Diagnosis Date  . Diabetes mellitus without complication (HCC)   . Glaucoma   . Hyperlipidemia   . Hypertension   . Tracheostomy present [facility](HCC)     Past Surgical History:  Procedure Laterality Date  . ANTERIOR CERVICAL DECOMP/DISCECTOMY FUSION N/A 10/07/2012   Procedure: ANTERIOR CERVICAL DECOMPRESSION/DISCECTOMY FUSION 2 LEVELS;  Surgeon: Karn CassisErnesto M Botero, MD;  [facility];  Service: Neurosurgery;  Laterality: N/A;  Anterior Cervical Three-Four/Four-Five Decompression and Fusion  . EYE SURGERY    . FOOT SURGERY Left    Pt states a tendon was removed to replace one in right hand.  Marland Kitchen. HAND SURGERY Right   . TRACHEOSTOMY      There were no vitals filed for this visit.  Subjective Assessment - 08/24/18 1152    Subjective  I feel okay today, a little pain in my knee but nothing to speek about    Pertinent History  Hx TBI with central cord syndrome in 2014, with subsequent spinal cord compression and surgery; arthritis    Patient Stated Goals  Pt's goal for therapy is to be able to touch my head with my hands, raise legs better to get into car       therex and neuro rehab performed today:  Nu step 5 min L3 UE/LE Seated hamstring stretch with foot on  step 30 sec X 2 bilat  Bridges 2X15 SLR 2X10 bilat Seated LAQ and marches with 2 lbs 2X10 bilat ea Stairs up/down X 3 Heel toe raies  X15 ea Standing hip abd and flexion X 15 ea gait 175 ft holding onto cane,  retro walk and sidestepping in bars      PT Education - 08/24/18 1153    Education Details  HEP review    Person(s) Educated  Patient    Methods  Explanation;Demonstration    Comprehension  Verbalized understanding;Returned demonstration       PT Short Term Goals - 08/17/18 1210      PT SHORT TERM GOAL #1   Title  Pt will be independent with HEP for improved strength, balance, gait.  TARGET 09/16/2018    Time  4    Period  Weeks    Status  New    Target Date  09/16/18      PT SHORT TERM GOAL #2   Title  Pt will improve 5x sit<>stand score to less than or equal to 13 seconds, with UE support, for improved transfer efficiency and lower extremity strength.    Time  4    Period  Weeks    Status  New    Target Date  09/16/18  PT SHORT TERM GOAL #3   Title  Pt will improve score to less than or equal to 15 seconds for decreased fall risk.    Time  4    Period  Weeks    Status  New    Target Date  09/16/18      PT SHORT TERM GOAL #4   Title  Pt will negotiatie at least 4 steps, step through pattern, modified independently for improved safe stair negotiation.    Time  4    Period  Weeks    Status  New    Target Date  09/16/18      PT SHORT TERM GOAL #5   Title  Pt will verbalize understanding of fall prevention in home environment.    Time  4    Period  Weeks    Status  New    Target Date  09/16/18        PT Long Term Goals - 08/17/18 1214      PT LONG TERM GOAL #1   Title  Pt will be independent with progression of HEP for improved strength, balance, gait.  TARGET 10/14/2018    Time  8    Period  Weeks    Status  New    Target Date  10/14/18      PT LONG TERM GOAL #2   Title  Pt will report improved ease of transfers from <18" surfaces by at  least 50%, demonstrating improved lower extremity strength.    Time  8    Period  Weeks    Status  New    Target Date  10/14/18      PT LONG TERM GOAL #3   Title  Pt will improve TUG score to less than or equal to 13.5 sec for decreased fall risk.    Time  8    Period  Weeks    Status  New    Target Date  10/14/18      PT LONG TERM GOAL #4   Title  Pt will improve DGI score to at least 19/24 for decreased fall risk.    Time  8    Period  Weeks    Status  New    Target Date  10/14/18      PT LONG TERM GOAL #5   Title  Pt will improve gait velocity to at least 2.62 ft/sec for improved gait efficiency and safety in the community.    Time  8    Period  Weeks    Status  New    Target Date  10/14/18            Plan - 08/24/18 1155    Clinical Impression Statement  Progressed strength and balance today without complaints. Did have better balance today with his gait and activiites. Continue POC    Personal Factors and Comorbidities  Comorbidity 3+    Comorbidities  Hx of TBI, spinal cord compression, central cord syndrome, peripheral neuropathy, bilateral carpal tunnel syndrome    Examination-Activity Limitations  Dressing;Reach Overhead;Locomotion Level;Transfers    Examination-Participation Restrictions  Community Activity;Other   Curatormechanic work   Presenter, broadcastingehab Potential  Good    PT Frequency  2x / week    PT Duration  8 weeks   plus eval   PT Treatment/Interventions  ADLs/Self Care Home Management;Gait training;Neuromuscular re-education;Balance training;Therapeutic exercise;Therapeutic activities;Functional mobility training;Stair training;Patient/family education    PT Next Visit Plan  lower extremity strengthening; balance exercises; functional  activities:  getting legs into and out of truck, controlled step downs for stair negotiation; PT to ask for OT referral-please check on this and schedule    Consulted and Agree with Plan of Care  Patient       Patient will benefit from  skilled therapeutic intervention in order to improve the following deficits and impairments:  Abnormal gait, Decreased balance, Decreased mobility, Decreased strength, Difficulty walking, Impaired flexibility  Visit Diagnosis: 1. Other abnormalities of gait and mobility   2. Unsteadiness on feet   3. Muscle weakness (generalized)        Problem List Patient Active Problem List   Diagnosis Date Noted  . Urinary tract infection due to ESBL Klebsiella 06/08/2018  . Recurrent UTI 05/10/2018  . 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 BMI 40.0-44.9, adult ([place]) 11/25/2017  . Cervical disc disease with myelopathy 11/15/2017  . Anemia 10/20/2017  . Benign essential hypertension 10/20/2017  . Constipation 10/20/2017  . Controlled type 2 diabetes mellitus with hyperglycemia, without long-term current use of insulin ([place]) 10/20/2017  . Generalized osteoarthritis of multiple sites 10/20/2017  . Hoarseness or changing voice 10/20/2017  . 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 Upper extremity weakness 06/16/2017  . S/P cervical spinal fusion 04/29/2017  . Bilateral lower extremity edema 01/09/2017  . 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 Bilateral vocal cord paralysis 12/22/2015  . Vocal cord paralysis, unilateral complete 12/22/2015  . Oropharyngeal dysphagia 12/02/2015  . Elevated troponin level 11/19/2015  . Hypertension 11/14/2015  . Type 2 diabetes mellitus, without long-term current use of insulin ([place]) 11/14/2015  . BPH (benign prostatic hyperplasia) 06/14/2014  . Epididymitis, right 05/17/2014  . Hematuria 05/17/2014  . Swollen testicle 05/17/2014  . Testis mass 05/17/2014  . Gross hematuria 10/19/2013  . Central cord syndrome [facility]) 10/13/2012    Debbe Odea, PT,DPT 08/24/2018, 12:06 PM  [facility] [address] [place], [place], [address] Phone:  [phone]   Fax:  [phone]  Name: Dale Key MRN: [id number] Date of Birth: 1955/11/17

## 2018-08-29 ENCOUNTER — Other Ambulatory Visit: Payer: Self-pay

## 2018-08-29 ENCOUNTER — Ambulatory Visit: Payer: Medicare Other | Admitting: Physical Therapy

## 2018-08-29 DIAGNOSIS — R2689 Other abnormalities of gait and mobility: Secondary | ICD-10-CM

## 2018-08-29 DIAGNOSIS — R2681 Unsteadiness on feet: Secondary | ICD-10-CM

## 2018-08-29 DIAGNOSIS — M6281 Muscle weakness (generalized): Secondary | ICD-10-CM

## 2018-08-29 NOTE — Therapy (Signed)
[facility] [address] [place], [place], [address] Phone: [phone]   Fax:  [phone]  Physical Therapy Treatment  Patient Details  Name: Dale Key MRN: [id number] Date of Birth: Mar 22, 1955 Referring Provider (PT): Tracie Harrier MD   Encounter Date: 08/29/2018  PT End of Session - 08/29/18 0946    Visit Number  4    Number of Visits  17    Date for PT Re-Evaluation  11/15/18    Authorization Type  UHC Medicare/Medicaid    PT Start Time  [phone]   he was 11 min late to appointmnet   PT Stop Time  1015    PT Time Calculation (min)  34 min    Activity Tolerance  Patient tolerated treatment well    Behavior During Therapy  [facility] for tasks assessed/performed       Past Medical History:  Diagnosis Date  . Diabetes mellitus without complication ([place])   . Glaucoma   . Hyperlipidemia   . Hypertension   . Tracheostomy present [facility])     Past Surgical History:  Procedure Laterality Date  . ANTERIOR CERVICAL DECOMP/DISCECTOMY FUSION N/A 10/07/2012   Procedure: ANTERIOR CERVICAL DECOMPRESSION/DISCECTOMY FUSION 2 LEVELS;  Surgeon: Floyce Stakes, MD;  Location: [place];  Service: Neurosurgery;  Laterality: N/A;  Anterior Cervical Three-Four/Four-Five Decompression and Fusion  . EYE SURGERY    . FOOT SURGERY Left    Pt states a tendon was removed to replace one in right hand.  Marland Kitchen HAND SURGERY Right   . TRACHEOSTOMY      There were no vitals filed for this visit.  Subjective Assessment - 08/29/18 0945    Subjective  6/10 pain in Rt hip and Lt knee today, "I dont know if I can do the steps"    Pertinent History  Hx TBI with central cord syndrome in 2014, with subsequent spinal cord compression and surgery; arthritis    Patient Stated Goals  Pt's goal for therapy is to be able to touch my head with my hands, raise legs better to get into car                       [facility] Adult PT Treatment/Exercise  - 08/29/18 0001      Ambulation/Gait   Ambulation/Gait  Yes    Ambulation Distance (Feet)  250 Feet    Assistive device  Straight cane      High Level Balance   High Level Balance Activities  Head turns;Side stepping;Backward walking    High Level Balance Comments  supervision      Knee/Hip Exercises: Stretches   Active Hamstring Stretch  Right;Left;2 reps;30 seconds    Active Hamstring Stretch Limitations  sitting      Knee/Hip Exercises: Aerobic   Nustep  sci fit 5 min      Knee/Hip Exercises: Standing   Heel Raises Limitations  heel toe raises X 20    Other Standing Knee Exercises  standing hip abd,  ext X 15 bilat ea      Knee/Hip Exercises: Seated   Long Arc Quad  Both;15 reps    Long Arc Quad Weight  3 lbs.    Marching  Both;15 reps    Marching Weights  3 lbs.      Knee/Hip Exercises: Supine   Bridges  2 sets;15 reps    Straight Leg Raises  --  PT Short Term Goals - 08/17/18 1210      PT SHORT TERM GOAL #1   Title  Pt will be independent with HEP for improved strength, balance, gait.  TARGET 09/16/2018    Time  4    Period  Weeks    Status  New    Target Date  09/16/18      PT SHORT TERM GOAL #2   Title  Pt will improve 5x sit<>stand score to less than or equal to 13 seconds, with UE support, for improved transfer efficiency and lower extremity strength.    Time  4    Period  Weeks    Status  New    Target Date  09/16/18      PT SHORT TERM GOAL #3   Title  Pt will improve score to less than or equal to 15 seconds for decreased fall risk.    Time  4    Period  Weeks    Status  New    Target Date  09/16/18      PT SHORT TERM GOAL #4   Title  Pt will negotiatie at least 4 steps, step through pattern, modified independently for improved safe stair negotiation.    Time  4    Period  Weeks    Status  New    Target Date  09/16/18      PT SHORT TERM GOAL #5   Title  Pt will verbalize understanding of fall prevention in home  environment.    Time  4    Period  Weeks    Status  New    Target Date  09/16/18        PT Long Term Goals - 08/17/18 1214      PT LONG TERM GOAL #1   Title  Pt will be independent with progression of HEP for improved strength, balance, gait.  TARGET 10/14/2018    Time  8    Period  Weeks    Status  New    Target Date  10/14/18      PT LONG TERM GOAL #2   Title  Pt will report improved ease of transfers from <18" surfaces by at least 50%, demonstrating improved lower extremity strength.    Time  8    Period  Weeks    Status  New    Target Date  10/14/18      PT LONG TERM GOAL #3   Title  Pt will improve TUG score to less than or equal to 13.5 sec for decreased fall risk.    Time  8    Period  Weeks    Status  New    Target Date  10/14/18      PT LONG TERM GOAL #4   Title  Pt will improve DGI score to at least 19/24 for decreased fall risk.    Time  8    Period  Weeks    Status  New    Target Date  10/14/18      PT LONG TERM GOAL #5   Title  Pt will improve gait velocity to at least 2.62 ft/sec for improved gait efficiency and safety in the community.    Time  8    Period  Weeks    Status  New    Target Date  10/14/18            Plan - 08/29/18 0958    Clinical Impression Statement  He had some increased hip and knee pain today and did not want to perform stairs. Also he was 11 min late so some therex had to be held due to time contraints. Time did focus on continuing leg strength, gait, and balance as tolerated. He says that MD office called him and said they would send a referral for OT which was requested by another PT.    Personal Factors and Comorbidities  Comorbidity 3+    Comorbidities  Hx of TBI, spinal cord compression, central cord syndrome, peripheral neuropathy, bilateral carpal tunnel syndrome    Examination-Activity Limitations  Dressing;Reach Overhead;Locomotion Level;Transfers    Examination-Participation Restrictions  Community Activity;Other    Curatormechanic work   Presenter, broadcastingehab Potential  Good    PT Frequency  2x / week    PT Duration  8 weeks   plus eval   PT Treatment/Interventions  ADLs/Self Care Home Management;Gait training;Neuromuscular re-education;Balance training;Therapeutic exercise;Therapeutic activities;Functional mobility training;Stair training;Patient/family education    PT Next Visit Plan  lower extremity strengthening; balance exercises; functional activities:  getting legs into and out of truck, controlled step downs for stair negotiation; PT to ask for OT referral-please check on this and schedule    Consulted and Agree with Plan of Care  Patient       Patient will benefit from skilled therapeutic intervention in order to improve the following deficits and impairments:  Abnormal gait, Decreased balance, Decreased mobility, Decreased strength, Difficulty walking, Impaired flexibility  Visit Diagnosis: 1. Other abnormalities of gait and mobility   2. Unsteadiness on feet   3. Muscle weakness (generalized)        Problem List Patient Active Problem List   Diagnosis Date Noted  . Urinary tract infection due to ESBL Klebsiella 06/08/2018  . Recurrent UTI 05/10/2018  . 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 BMI 40.0-44.9, adult (HCC) 11/25/2017  . Cervical disc disease with myelopathy 11/15/2017  . Anemia 10/20/2017  . Benign essential hypertension 10/20/2017  . Constipation 10/20/2017  . Controlled type 2 diabetes mellitus with hyperglycemia, without long-term current use of insulin (HCC) 10/20/2017  . Generalized osteoarthritis of multiple sites 10/20/2017  . Hoarseness or changing voice 10/20/2017  . 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 Upper extremity weakness 06/16/2017  . S/P cervical spinal fusion 04/29/2017  . Bilateral lower extremity edema 01/09/2017  . 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 Bilateral vocal cord paralysis 12/22/2015  . Vocal  cord paralysis, unilateral complete 12/22/2015  . Oropharyngeal dysphagia 12/02/2015  . Elevated troponin level 11/19/2015  . Hypertension 11/14/2015  . Type 2 diabetes mellitus, without long-term current use of insulin (HCC) 11/14/2015  . BPH (benign prostatic hyperplasia) 06/14/2014  . Epididymitis, right 05/17/2014  . Hematuria 05/17/2014  . Swollen testicle 05/17/2014  . Testis mass 05/17/2014  . Gross hematuria 10/19/2013  . Central cord syndrome [facility](HCC) 10/13/2012    Birdie RiddleBrian R Corry Ihnen,PT,DPT 08/29/2018, 10:18 AM  [facility] [address] [place], [place], [address] Phone: [phone]   Fax:  [phone]  Name: Annette StableGirrie Buonocore MRN: [id number] Date of Birth: 15-Jun-1955

## 2018-08-30 ENCOUNTER — Ambulatory Visit (INDEPENDENT_AMBULATORY_CARE_PROVIDER_SITE_OTHER): Payer: Medicare Other | Admitting: Podiatry

## 2018-08-30 ENCOUNTER — Encounter: Payer: Self-pay | Admitting: Podiatry

## 2018-08-30 VITALS — Temp 97.7°F

## 2018-08-30 DIAGNOSIS — M79675 Pain in left toe(s): Secondary | ICD-10-CM

## 2018-08-30 DIAGNOSIS — E119 Type 2 diabetes mellitus without complications: Secondary | ICD-10-CM

## 2018-08-30 DIAGNOSIS — B351 Tinea unguium: Secondary | ICD-10-CM

## 2018-08-30 DIAGNOSIS — M79674 Pain in right toe(s): Secondary | ICD-10-CM

## 2018-08-30 DIAGNOSIS — L84 Corns and callosities: Secondary | ICD-10-CM | POA: Diagnosis not present

## 2018-08-30 NOTE — Patient Instructions (Signed)

## 2018-09-02 ENCOUNTER — Ambulatory Visit: Payer: Medicare Other | Admitting: Physical Therapy

## 2018-09-06 ENCOUNTER — Ambulatory Visit: Payer: Medicare Other | Admitting: Physical Therapy

## 2018-09-06 ENCOUNTER — Encounter: Payer: Self-pay | Admitting: Physical Therapy

## 2018-09-06 ENCOUNTER — Other Ambulatory Visit: Payer: Self-pay

## 2018-09-06 DIAGNOSIS — R2689 Other abnormalities of gait and mobility: Secondary | ICD-10-CM | POA: Diagnosis not present

## 2018-09-06 DIAGNOSIS — M6281 Muscle weakness (generalized): Secondary | ICD-10-CM

## 2018-09-06 DIAGNOSIS — R2681 Unsteadiness on feet: Secondary | ICD-10-CM

## 2018-09-06 NOTE — Patient Instructions (Addendum)
Access Code: [email or web address]  URL: [email or web address]  Date: 09/06/2018  Prepared by: Mady Haagensen   Exercises  Seated Hamstring Stretch - 3 sets - 30 hold - 2x daily - 6x weekly  Supine Bridge - 10 reps - 1-2 sets - 5 hold - 2x daily - 6x weekly  Sit to Stand - 10 reps - 2-3 sets - 2x daily - 6x weekly  Step Up - 10 reps - 2 sets - 2x daily - 6x weekly  Heel Toe Raises with Unilateral Counter Support - 10 reps - 3 sets - 2x daily - 6x weekly  Standing Tandem Balance with Unilateral Counter Support - 3 sets - 30 hold - 2x daily - 6x weekly  Standing Single Leg Stance with Unilateral Counter Support - 3 reps - 1 sets - 30 hold - 2x daily - 6x weekly   Added 09/06/2018: Stride Stance Weight Shift - 10 reps - 2 sets - 1x daily - 6x weekly

## 2018-09-06 NOTE — Therapy (Signed)
[facility] [address] [place], [place], [address] Phone: [phone]   Fax:  [phone]  Physical Therapy Treatment  Patient Details  Name: Dale Key MRN: [id number] Date of Birth: 10-02-1955 Referring Provider (PT): Barbette ReichmannHande, Vishwanath MD   Encounter Date: 09/06/2018 CLINIC OPERATION CHANGES: Outpatient Neuro Rehab is open at lower capacity following universal masking, social distancing, and patient screening.  The patient's COVID risk of complications score is 5. PT End of Session - 09/06/18 2121    Visit Number  5    Number of Visits  17    Date for PT Re-Evaluation  11/15/18    Authorization Type  [employer] Medicare/Medicaid    PT Start Time  [phone]   Pt arrives late   PT Stop Time  1015    PT Time Calculation (min)  32 min    Activity Tolerance  Patient tolerated treatment well;No increased pain    Behavior During Therapy  WFL for tasks assessed/performed       Past Medical History:  Diagnosis Date  . Diabetes mellitus without complication (HCC)   . Glaucoma   . Hyperlipidemia   . Hypertension   . Tracheostomy present [facility](HCC)     Past Surgical History:  Procedure Laterality Date  . ANTERIOR CERVICAL DECOMP/DISCECTOMY FUSION N/A 10/07/2012   Procedure: ANTERIOR CERVICAL DECOMPRESSION/DISCECTOMY FUSION 2 LEVELS;  Surgeon: Karn CassisErnesto M Botero, MD;  [facility];  Service: Neurosurgery;  Laterality: N/A;  Anterior Cervical Three-Four/Four-Five Decompression and Fusion  . EYE SURGERY    . FOOT SURGERY Left    Pt states a tendon was removed to replace one in right hand.  Marland Kitchen. HAND SURGERY Right   . TRACHEOSTOMY      There were no vitals filed for this visit.  Subjective Assessment - 09/06/18 0945    Subjective  My R hip is bothering me today.    Pertinent History  Hx TBI with central cord syndrome in 2014, with subsequent spinal cord compression and surgery; arthritis    Patient Stated Goals  Pt's goal for  therapy is to be able to touch my head with my hands, raise legs better to get into car    Currently in Pain?  Yes    Pain Score  7     Pain Location  Hip    Pain Orientation  Right    Pain Descriptors / Indicators  Aching    Pain Type  Chronic pain    Pain Frequency  Intermittent    Aggravating Factors   just started    Pain Relieving Factors  sometimes walking or stretching                       [facility] Adult PT Treatment/Exercise - 09/06/18 0001      Ambulation/Gait   Ambulation/Gait  Yes    Ambulation Distance (Feet)  115 Feet   100 ft x 2   Assistive device  Straight cane    Gait Comments  Following stagger stance weightshifting, cues for heelstrike, with pt able to perform improved step length, heelstrike with gait.      High Level Balance   High Level Balance Activities  Side stepping;Marching forwards;Tandem walking   3 reps along counter   High Level Balance Comments  Marching and tandem 2 reps along counter with intermittent UE supprot      Knee/Hip Exercises: Stretches   Active Hamstring Stretch  Right;Left;2 reps;30 seconds    Active  Hamstring Stretch Limitations  sitting-review of HEP      Knee/Hip Exercises: Standing   Heel Raises Limitations  Heel toe raises x 10 reps-review of HEP    Other Standing Knee Exercises  Stagger stance forward/back weightshifting at counter, with focus on dynamic gastroc stretch on front foot with posterior weigthshift      Knee/Hip Exercises: Seated   Sit to Sand  1 set;5 reps   No UE support, review of HEP     Knee/Hip Exercises: Supine   Bridges  Strengthening;Both;1 set;15 reps   Review of HEP         Balance Exercises - 09/06/18 2043      Balance Exercises: Standing   Tandem Stance  Intermittent upper extremity support;Eyes open;2 reps;30 secs    SLS  Eyes open;Upper extremity support 1;2 reps;10 secs    Step Ups  Forward;6 inch;UE support 2   10reps       PT Education - 09/06/18 2121    Education  Details  Addition to HEP-see instructions    Person(s) Educated  Patient    Methods  Explanation;Demonstration;Verbal cues;Handout    Comprehension  Verbalized understanding;Returned demonstration;Verbal cues required       PT Short Term Goals - 08/17/18 1210      PT SHORT TERM GOAL #1   Title  Pt will be independent with HEP for improved strength, balance, gait.  TARGET 09/16/2018    Time  4    Period  Weeks    Status  New    Target Date  09/16/18      PT SHORT TERM GOAL #2   Title  Pt will improve 5x sit<>stand score to less than or equal to 13 seconds, with UE support, for improved transfer efficiency and lower extremity strength.    Time  4    Period  Weeks    Status  New    Target Date  09/16/18      PT SHORT TERM GOAL #3   Title  Pt will improve score to less than or equal to 15 seconds for decreased fall risk.    Time  4    Period  Weeks    Status  New    Target Date  09/16/18      PT SHORT TERM GOAL #4   Title  Pt will negotiatie at least 4 steps, step through pattern, modified independently for improved safe stair negotiation.    Time  4    Period  Weeks    Status  New    Target Date  09/16/18      PT SHORT TERM GOAL #5   Title  Pt will verbalize understanding of fall prevention in home environment.    Time  4    Period  Weeks    Status  New    Target Date  09/16/18        PT Long Term Goals - 08/17/18 1214      PT LONG TERM GOAL #1   Title  Pt will be independent with progression of HEP for improved strength, balance, gait.  TARGET 10/14/2018    Time  8    Period  Weeks    Status  New    Target Date  10/14/18      PT LONG TERM GOAL #2   Title  Pt will report improved ease of transfers from <18" surfaces by at least 50%, demonstrating improved lower extremity strength.    Time  8    Period  Weeks    Status  New    Target Date  10/14/18      PT LONG TERM GOAL #3   Title  Pt will improve TUG score to less than or equal to 13.5 sec for decreased fall  risk.    Time  8    Period  Weeks    Status  New    Target Date  10/14/18      PT LONG TERM GOAL #4   Title  Pt will improve DGI score to at least 19/24 for decreased fall risk.    Time  8    Period  Weeks    Status  New    Target Date  10/14/18      PT LONG TERM GOAL #5   Title  Pt will improve gait velocity to at least 2.62 ft/sec for improved gait efficiency and safety in the community.    Time  8    Period  Weeks    Status  New    Target Date  10/14/18            Plan - 09/06/18 2123    Clinical Impression Statement  Reviewed HEP this visit, with pt return demo understanding.  With standing stregnthening and balance exercises, pt reports improvement in hip discomfort throughout session.  After exercises for dynamic gastroc stretching, pt demonstrates improved foot clearance and heelstrik with gait.  Pt will conitnue to benefit from skilled PT to further improve strength and gait.    Personal Factors and Comorbidities  Comorbidity 3+    Comorbidities  Hx of TBI, spinal cord compression, central cord syndrome, peripheral neuropathy, bilateral carpal tunnel syndrome    Examination-Activity Limitations  Dressing;Reach Overhead;Locomotion Level;Transfers    Examination-Participation Restrictions  Community Activity;Other   Curatormechanic work   Presenter, broadcastingehab Potential  Good    PT Frequency  2x / week    PT Duration  8 weeks   plus eval   PT Treatment/Interventions  ADLs/Self Care Home Management;Gait training;Neuromuscular re-education;Balance training;Therapeutic exercise;Therapeutic activities;Functional mobility training;Stair training;Patient/family education    PT Next Visit Plan  Review stagger stance weightshifting, work on lower extremity strengthening; balance exercises; functional activities:  getting legs into and out of truck, controlled step downs for stair negotiation    Consulted and Agree with Plan of Care  Patient       Patient will benefit from skilled therapeutic  intervention in order to improve the following deficits and impairments:  Abnormal gait, Decreased balance, Decreased mobility, Decreased strength, Difficulty walking, Impaired flexibility  Visit Diagnosis: Muscle weakness (generalized)  Unsteadiness on feet  Other abnormalities of gait and mobility     Problem List Patient Active Problem List   Diagnosis Date Noted  . Poor mobility 06/24/2018  . Urinary tract infection due to ESBL Klebsiella 06/08/2018  . Recurrent UTI 05/10/2018  . 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 BMI 40.0-44.9, adult (HCC) 11/25/2017  . Cervical disc disease with myelopathy 11/15/2017  . Anemia 10/20/2017  . Benign essential hypertension 10/20/2017  . Constipation 10/20/2017  . Controlled type 2 diabetes mellitus with hyperglycemia, without long-term current use of insulin (HCC) 10/20/2017  . Generalized osteoarthritis of multiple sites 10/20/2017  . Hoarseness or changing voice 10/20/2017  . 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 Upper extremity weakness 06/16/2017  . S/P cervical spinal fusion 04/29/2017  . Bilateral lower extremity edema 01/09/2017  . 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 Nontoxic  multinodular goiter 05/19/2016  . Bilateral vocal cord paralysis 12/22/2015  . Vocal cord paralysis, unilateral complete 12/22/2015  . Oropharyngeal dysphagia 12/02/2015  . Elevated troponin level 11/19/2015  . Hypertension 11/14/2015  . Type 2 diabetes mellitus, without long-term current use of insulin (HCC) 11/14/2015  . BPH (benign prostatic hyperplasia) 06/14/2014  . Epididymitis, right 05/17/2014  . Hematuria 05/17/2014  . Swollen testicle 05/17/2014  . Testis mass 05/17/2014  . Gross hematuria 10/19/2013  . Central cord syndrome [facility]) 10/13/2012    Jaryah Aracena W. 09/06/2018, 9:27 PM  Gean Maidens., PT   [facility] [address] [place], [place],  [address] Phone: [phone]   Fax:  [phone]  Name: Angelo Burris MRN: [id number] Date of Birth: Dec 10, 1955

## 2018-09-08 NOTE — Progress Notes (Signed)
Subjective:  Dale Key presents to clinic today with cc of  painful, thick, discolored, elongated toenails 1-5 b/l that become tender and cannot cut because of thickness.  Pain is aggravated when wearing enclosed shoe gear.  Tracie Harrier, MD is his PCP.   Current Outpatient Medications:  .  aspirin 81 MG tablet, Take 81 mg by mouth daily., Disp: , Rfl:  .  cefdinir (OMNICEF) 300 MG capsule, TAKE 1 CAPSULE BY MOUTH TWICE DAILY FOR 10 DAYS, Disp: , Rfl:  .  Cholecalciferol (VITAMIN D-1000 MAX ST) 1000 units tablet, Take 1,000 Units by mouth daily., Disp: , Rfl:  .  Coenzyme Q10 10 MG capsule, Take by mouth., Disp: , Rfl:  .  fluticasone (FLONASE) 50 MCG/ACT nasal spray, USE 1 SPRAY(S) IN EACH NOSTRIL TWICE DAILY, Disp: , Rfl:  .  fosfomycin (MONUROL) 3 g PACK, Take by mouth., Disp: , Rfl:  .  furosemide (LASIX) 40 MG tablet, Take by mouth., Disp: , Rfl:  .  linaclotide (LINZESS) 145 MCG CAPS capsule, Take 145 mcg by mouth daily., Disp: , Rfl:  .  losartan (COZAAR) 100 MG tablet, Take 50 mg by mouth every morning. , Disp: , Rfl:  .  meloxicam (MOBIC) 7.5 MG tablet, Take by mouth., Disp: , Rfl:  .  meloxicam (MOBIC) 7.5 MG tablet, Take 7.5 mg by mouth daily., Disp: , Rfl:  .  metFORMIN (GLUCOPHAGE) 1000 MG tablet, Take by mouth., Disp: , Rfl:  .  metoprolol succinate (TOPROL-XL) 50 MG 24 hr tablet, Take 1 tablet (50 mg total) by mouth every morning. Take with or immediately following a meal., Disp: 30 tablet, Rfl: 1 .  Multiple Vitamin (MULTI-VITAMINS) TABS, Take by mouth., Disp: , Rfl:  .  nitrofurantoin, macrocrystal-monohydrate, (MACROBID) 100 MG capsule, TAKE 1 CAPSULE BY MOUTH TWICE DAILY FOR 5 DAYS, Disp: , Rfl:  .  potassium chloride SA (K-DUR,KLOR-CON) 20 MEQ tablet, Take 1 tablet (20 mEq total) by mouth daily as needed (For potassium loss.)., Disp: 30 tablet, Rfl: 0 .  predniSONE (DELTASONE) 20 MG tablet, 3 tabs po day one, then 2 po daily x 4 days (Patient not taking: Reported  on 08/17/2018), Disp: 11 tablet, Rfl: 0 .  rosuvastatin (CRESTOR) 10 MG tablet, Take 10 mg by mouth daily., Disp: , Rfl:  .  sulfamethoxazole-trimethoprim (BACTRIM DS,SEPTRA DS) 800-160 MG tablet, Take 1 tablet by mouth 2 (two) times daily. for 10 days, Disp: , Rfl:  .  UNABLE TO FIND, Take by mouth., Disp: , Rfl:  .  vitamin C (ASCORBIC ACID) 500 MG tablet, Take by mouth., Disp: , Rfl:  .  Vitamin D, Ergocalciferol, (DRISDOL) 1.25 MG (50000 UT) CAPS capsule, TAKE ONE CAPSULE BY MOUTH ONCE A WEEK. WHEN FINISHED WITH THIS PRESCRIPTION PT SHOULD START TAKING OTC VITAMIN D3 1000 UNITS DAILY FOR 8 DOS, Disp: , Rfl:    Allergies  Allergen Reactions  . Sulfamethoxazole-Trimethoprim Other (See Comments) and Diarrhea     Objective: Vitals:   08/30/18 0955  Temp: 97.7 F (36.5 C)    Physical Examination:  Vascular Examination: Capillary refill time immediate x 10 digits.  Palpable DP/PT pulses b/l.  Digital hair absent bilaterally.  Bilateral lower extremity edema noted.  No increased warmth, no blisters, no weeping.  No pain with calf compression no warmth or erythema noted bilaterally. No edema noted b/l.  Skin temperature gradient WNL b/l.  Dermatological Examination: Skin with normal turgor, texture and tone b/l.  No open wounds b/l.  No interdigital macerations  noted b/l.  Elongated, thick, discolored brittle toenails with subungual debris and pain on dorsal palpation of nailbeds 1-5 b/l.  Hyperkeratotic lesions submet head 1 bilaterally, bilateral great toes, distal tip left third digit.  No edema, no erythema, no drainage, no flocculence.  He has a transverse scar noted over the dorsal left foot secondary to childhood trauma.  Musculoskeletal Examination: Muscle strength 5/5 to all muscle groups b/l.  Hammertoe deformity second and third digits bilaterally  No pain, crepitus or joint discomfort with active/passive ROM.  Neurological Examination: Sensation intact  5/5 b/l with 10 gram monofilament.  Vibratory sensation intact b/l.  Proprioceptive sensation intact b/l.  Assessment: Mycotic nail infection with pain 1-5 b/l Calluses submetatarsal head 1 bilaterally and bilateral great toes Corn distal tip left third digit NIDDM  Plan: 1. Toenails 1-5 b/l were debrided in length and girth without iatrogenic laceration. 2. Hyperkeratotic lesions pared submetatarsal head 1 bilaterally bilateral great toes and distal tip left third digit utilizing sterile scalpel blade without incident. 3. Continue soft, supportive shoe gear daily. 4. Report any pedal injuries to medical professional 5. Follow up 10 weeks. 6. Patient/POA to call should there be a question/concern in there interim.

## 2018-09-09 ENCOUNTER — Encounter: Payer: Self-pay | Admitting: Physical Therapy

## 2018-09-09 ENCOUNTER — Ambulatory Visit: Payer: Medicare Other | Admitting: Physical Therapy

## 2018-09-09 ENCOUNTER — Other Ambulatory Visit: Payer: Self-pay

## 2018-09-09 DIAGNOSIS — R2681 Unsteadiness on feet: Secondary | ICD-10-CM

## 2018-09-09 DIAGNOSIS — M6281 Muscle weakness (generalized): Secondary | ICD-10-CM

## 2018-09-09 DIAGNOSIS — R2689 Other abnormalities of gait and mobility: Secondary | ICD-10-CM | POA: Diagnosis not present

## 2018-09-09 NOTE — Therapy (Signed)
Dale Key [facility] [address] [place], [place], [address] Phone: [phone]   Fax:  [phone]  Physical Therapy Treatment  Patient Details  Name: Dale Key MRN: [id number] Date of Birth: 10-Apr-1955 Referring Provider (PT): Barbette Reichmann MD   Encounter Date: 09/09/2018 CLINIC OPERATION CHANGES: Outpatient Neuro Rehab is open at lower capacity following universal masking, social distancing, and patient screening.  The patient's COVID risk of complications score is 5.  PT End of Session - 09/09/18 1126    Visit Number  6    Number of Visits  17    Date for PT Re-Evaluation  11/15/18    Authorization Type  UHC Medicare/Medicaid    PT Start Time  [phone]    PT Stop Time  0932    PT Time Calculation (min)  39 min    Activity Tolerance  Patient tolerated treatment well;No increased pain    Behavior During Therapy  WFL for tasks assessed/performed       Past Medical History:  Diagnosis Date  . Diabetes mellitus without complication (HCC)   . Glaucoma   . Hyperlipidemia   . Hypertension   . Tracheostomy present [facility])     Past Surgical History:  Procedure Laterality Date  . ANTERIOR CERVICAL DECOMP/DISCECTOMY FUSION N/A 10/07/2012   Procedure: ANTERIOR CERVICAL DECOMPRESSION/DISCECTOMY FUSION 2 LEVELS;  Surgeon: Karn Cassis, MD;  Location: [facility] OR;  Service: Neurosurgery;  Laterality: N/A;  Anterior Cervical Three-Four/Four-Five Decompression and Fusion  . EYE SURGERY    . FOOT SURGERY Left    Pt states a tendon was removed to replace one in right hand.  Marland Kitchen HAND SURGERY Right   . TRACHEOSTOMY      There were no vitals filed for this visit.  Subjective Assessment - 09/09/18 0855    Subjective  A little pain in the hip, but it's better though.  Nothing new.  The main problem is getting into the vehicle (more my truck than my van.)    Pertinent History  Hx TBI with central cord syndrome in 2014, with subsequent  spinal cord compression and surgery; arthritis    Patient Stated Goals  Pt's goal for therapy is to be able to touch my head with my hands, raise legs better to get into car    Currently in Pain?  Yes    Pain Score  5     Pain Location  Hip    Pain Orientation  Right    Pain Descriptors / Indicators  Aching    Pain Type  Chronic pain    Pain Frequency  Intermittent    Aggravating Factors   staying still too long    Pain Relieving Factors  stretching and exercises make it better                       [facility] Adult PT Treatment/Exercise - 09/09/18 0001      Knee/Hip Exercises: Stretches   Gastroc Stretch  Right;Left;3 reps;10 seconds;30 seconds   work up to 30 seconds, runner's stretch position   Theme park manager Limitations  then progress to gastroc stretch propped on 4" block, 2 reps, 20 seconds      Knee/Hip Exercises: Aerobic   Stepper  ScitFit, Level 2, 5 minutes, for leg flexibility and stregnthening, 4 extremities      Knee/Hip Exercises: Standing   Hip Flexion  Stengthening;Right;Left;2 sets;10 reps;Knee bent;Knee straight    Hip Flexion Limitations  Pt does better consecutive  reps on one leg, versus alternating legs    Other Standing Knee Exercises  Stagger stance forward/back weightshifting at counter, with focus on dynamic gastroc stretch on front foot with posterior weigthshift.  Marching in place, alternating legs x 10 reps, then alternating forward step taps to 4" step, 6" step, 10 reps each.  Then side step taps x 10 reps each leg to 4" step, then lateral step over obstacle, x 10 reps, all to address foot clearance for improved getting into vehicle.  Pt requires bilateral UE support      Knee/Hip Exercises: Seated   Other Seated Knee/Hip Exercises  Seated side step march out and in, 10 reps   Cues for increased foot clearance   Marching  Both;15 reps      Step tap to 4" block, with added forward lunge, x 10 reps with UE support, for improved hip/knee  flexion.       PT Education - 09/09/18 1125    Education Details  Gastroc stretching additions to HEP    Person(s) Educated  Patient    Methods  Explanation;Demonstration;Verbal cues;Handout    Comprehension  Verbalized understanding;Returned demonstration       PT Short Term Goals - 08/17/18 1210      PT SHORT TERM GOAL #1   Title  Pt will be independent with HEP for improved strength, balance, gait.  TARGET 09/16/2018    Time  4    Period  Weeks    Status  New    Target Date  09/16/18      PT SHORT TERM GOAL #2   Title  Pt will improve 5x sit<>stand score to less than or equal to 13 seconds, with UE support, for improved transfer efficiency and lower extremity strength.    Time  4    Period  Weeks    Status  New    Target Date  09/16/18      PT SHORT TERM GOAL #3   Title  Pt will improve score to less than or equal to 15 seconds for decreased fall risk.    Time  4    Period  Weeks    Status  New    Target Date  09/16/18      PT SHORT TERM GOAL #4   Title  Pt will negotiatie at least 4 steps, step through pattern, modified independently for improved safe stair negotiation.    Time  4    Period  Weeks    Status  New    Target Date  09/16/18      PT SHORT TERM GOAL #5   Title  Pt will verbalize understanding of fall prevention in home environment.    Time  4    Period  Weeks    Status  New    Target Date  09/16/18        PT Long Term Goals - 08/17/18 1214      PT LONG TERM GOAL #1   Title  Pt will be independent with progression of HEP for improved strength, balance, gait.  TARGET 10/14/2018    Time  8    Period  Weeks    Status  New    Target Date  10/14/18      PT LONG TERM GOAL #2   Title  Pt will report improved ease of transfers from <18" surfaces by at least 50%, demonstrating improved lower extremity strength.    Time  8    Period  Weeks    Status  New    Target Date  10/14/18      PT LONG TERM GOAL #3   Title  Pt will improve TUG score to  less than or equal to 13.5 sec for decreased fall risk.    Time  8    Period  Weeks    Status  New    Target Date  10/14/18      PT LONG TERM GOAL #4   Title  Pt will improve DGI score to at least 19/24 for decreased fall risk.    Time  8    Period  Weeks    Status  New    Target Date  10/14/18      PT LONG TERM GOAL #5   Title  Pt will improve gait velocity to at least 2.62 ft/sec for improved gait efficiency and safety in the community.    Time  8    Period  Weeks    Status  New    Target Date  10/14/18            Plan - 09/09/18 1126    Clinical Impression Statement  Focused today's skilled PT session on stretching for improved gastroc flexibility, seated and standing exercises to address foot clearance and lifting foot into his truck.  He is able to improve height of leg lifting with consective, repetitive practice on same leg (versus alternating legs).  Pt will continue to benefit from skilled PT to address strength, balance, gait for improved overall functional mobility.    Personal Factors and Comorbidities  Comorbidity 3+    Comorbidities  Hx of TBI, spinal cord compression, central cord syndrome, peripheral neuropathy, bilateral carpal tunnel syndrome    Examination-Activity Limitations  Dressing;Reach Overhead;Locomotion Level;Transfers    Examination-Participation Restrictions  Community Activity;Other   Dealer work   Neurosurgeon    PT Frequency  2x / week    PT Duration  8 weeks   plus eval   PT Treatment/Interventions  ADLs/Self Care Home Management;Gait training;Neuromuscular re-education;Balance training;Therapeutic exercise;Therapeutic activities;Functional mobility training;Stair training;Patient/family education    PT Next Visit Plan  Review gastroc stretches, work on lower extremity strengthening; balance exercises; functional activities:  getting legs into and out of truck, step ups, step downs; hip flexion/knee flexion in sitting/standing     Consulted and Agree with Plan of Care  Patient       Patient will benefit from skilled therapeutic intervention in order to improve the following deficits and impairments:  Abnormal gait, Decreased balance, Decreased mobility, Decreased strength, Difficulty walking, Impaired flexibility  Visit Diagnosis: Muscle weakness (generalized)  Unsteadiness on feet     Problem List Patient Active Problem List   Diagnosis Date Noted  . Poor mobility 06/24/2018  . Urinary tract infection due to ESBL Klebsiella 06/08/2018  . Recurrent UTI 05/10/2018  . 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 BMI 40.0-44.9, adult ([place]) 11/25/2017  . Cervical disc disease with myelopathy 11/15/2017  . Anemia 10/20/2017  . Benign essential hypertension 10/20/2017  . Constipation 10/20/2017  . Controlled type 2 diabetes mellitus with hyperglycemia, without long-term current use of insulin ([place]) 10/20/2017  . Generalized osteoarthritis of multiple sites 10/20/2017  . Hoarseness or changing voice 10/20/2017  . 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 Upper extremity weakness 06/16/2017  . S/P cervical spinal fusion 04/29/2017  . Bilateral lower extremity edema 01/09/2017  . Tracheostomy dependence ([place]) 05/26/2016  . Nontoxic multinodular goiter  05/19/2016  . Bilateral vocal cord paralysis 12/22/2015  . Vocal cord paralysis, unilateral complete 12/22/2015  . Oropharyngeal dysphagia 12/02/2015  . Elevated troponin level 11/19/2015  . Hypertension 11/14/2015  . Type 2 diabetes mellitus, without long-term current use of insulin (HCC) 11/14/2015  . BPH (benign prostatic hyperplasia) 06/14/2014  . Epididymitis, right 05/17/2014  . Hematuria 05/17/2014  . Swollen testicle 05/17/2014  . Testis mass 05/17/2014  . Gross hematuria 10/19/2013  . Central cord syndrome [facility](HCC) 10/13/2012    MARRIOTT,AMY W. 09/09/2018, 11:29 AM  Gean MaidensMARRIOTT,AMY W., PT   [facility] [address] [place], [place], [address] Phone: [phone]   Fax:  [phone]  Name: Annette StableGirrie Azucena MRN: [id number] Date of Birth: 06-Sep-1955

## 2018-09-09 NOTE — Patient Instructions (Addendum)
Access Code: [email or web address]  URL: [email or web address]  Date: 09/09/2018  Prepared by: Mady Haagensen   Exercises  Seated Hamstring Stretch - 3 sets - 30 hold - 2x daily - 6x weekly  Supine Bridge - 10 reps - 1-2 sets - 5 hold - 2x daily - 6x weekly  Sit to Stand - 10 reps - 2-3 sets - 2x daily - 6x weekly  Step Up - 10 reps - 2 sets - 2x daily - 6x weekly  Heel Toe Raises with Unilateral Counter Support - 10 reps - 3 sets - 2x daily - 6x weekly  Standing Tandem Balance with Unilateral Counter Support - 3 sets - 30 hold - 2x daily - 6x weekly  Standing Single Leg Stance with Unilateral Counter Support - 3 reps - 1 sets - 30 hold - 2x daily - 6x weekly  Stride Stance Weight Shift - 10 reps - 2 sets - 1x daily - 6x weekly   Additions 09/09/2018 Standing Gastroc Stretch - 3 reps - 1 sets - 30 sec hold - 1-2x daily - 6x weekly  Standing Gastroc Stretch on Step - 3 reps - 1 sets - 30 sec hold - 1-2x daily - 6x weekly

## 2018-09-12 ENCOUNTER — Ambulatory Visit: Payer: Medicare Other | Admitting: Physical Therapy

## 2018-09-14 ENCOUNTER — Ambulatory Visit: Payer: Medicare Other | Attending: Internal Medicine | Admitting: Physical Therapy

## 2018-09-14 ENCOUNTER — Other Ambulatory Visit: Payer: Self-pay

## 2018-09-14 DIAGNOSIS — R2689 Other abnormalities of gait and mobility: Secondary | ICD-10-CM | POA: Insufficient documentation

## 2018-09-14 DIAGNOSIS — M25612 Stiffness of left shoulder, not elsewhere classified: Secondary | ICD-10-CM | POA: Diagnosis present

## 2018-09-14 DIAGNOSIS — M25611 Stiffness of right shoulder, not elsewhere classified: Secondary | ICD-10-CM | POA: Insufficient documentation

## 2018-09-14 DIAGNOSIS — R2681 Unsteadiness on feet: Secondary | ICD-10-CM | POA: Insufficient documentation

## 2018-09-14 DIAGNOSIS — M6281 Muscle weakness (generalized): Secondary | ICD-10-CM | POA: Insufficient documentation

## 2018-09-14 DIAGNOSIS — R278 Other lack of coordination: Secondary | ICD-10-CM | POA: Diagnosis present

## 2018-09-14 NOTE — Therapy (Signed)
[facility] [address] [place], [place], [address] Phone: [phone]   Fax:  [phone]  Physical Therapy Treatment  Patient Details  Name: Dale Key MRN: [id number] Date of Birth: Aug 01, 1955 Referring Provider (PT): Tracie Harrier MD   Encounter Date: 09/14/2018  CLINIC OPERATION CHANGES: Outpatient Neuro Rehab is open at lower capacity following universal masking, social distancing, and patient screening.  The patient's COVID risk of complications score is 5.   PT End of Session - 09/14/18 1734    Visit Number  7    Number of Visits  17    Date for PT Re-Evaluation  11/15/18    Authorization Type  UHC Medicare/Medicaid    PT Start Time  [phone]   Pt arrives late and then has to use restroom   PT Stop Time  1018    PT Time Calculation (min)  30 min    Activity Tolerance  Patient tolerated treatment well;No increased pain    Behavior During Therapy  WFL for tasks assessed/performed       Past Medical History:  Diagnosis Date  . Diabetes mellitus without complication ([place])   . Glaucoma   . Hyperlipidemia   . Hypertension   . Tracheostomy present [facility])     Past Surgical History:  Procedure Laterality Date  . ANTERIOR CERVICAL DECOMP/DISCECTOMY FUSION N/A 10/07/2012   Procedure: ANTERIOR CERVICAL DECOMPRESSION/DISCECTOMY FUSION 2 LEVELS;  Surgeon: Floyce Stakes, MD;  Location: [facility];  Service: Neurosurgery;  Laterality: N/A;  Anterior Cervical Three-Four/Four-Five Decompression and Fusion  . EYE SURGERY    . FOOT SURGERY Left    Pt states a tendon was removed to replace one in right hand.  Marland Kitchen HAND SURGERY Right   . TRACHEOSTOMY      There were no vitals filed for this visit.  Subjective Assessment - 09/14/18 0950    Subjective  Had car trouble today, so I was running behind to get here.  (PT did speak with pt about consistently arriving late/missing several appointments-pt agrees to try to be at  appt on time.)    Pertinent History  Hx TBI with central cord syndrome in 2014, with subsequent spinal cord compression and surgery; arthritis    Patient Stated Goals  Pt's goal for therapy is to be able to touch my head with my hands, raise legs better to get into car    Currently in Pain?  Yes    Pain Score  4     Pain Location  Hip    Pain Orientation  Right    Pain Descriptors / Indicators  Aching    Pain Type  Chronic pain    Pain Frequency  Intermittent    Aggravating Factors   having to carry a heavy battery    Pain Relieving Factors  moving makes it better                       [facility] Adult PT Treatment/Exercise - 09/14/18 0001      Transfers   Transfers  Sit to Stand;Stand to Sit    Sit to Stand  6: Modified independent (Device/Increase time);With upper extremity assist;Without upper extremity assist;From chair/3-in-1    Five time sit to stand comments   12.72   with UE support   Stand to Sit  6: Modified independent (Device/Increase time);With upper extremity assist;With armrests;To chair/3-in-1      Ambulation/Gait   Ambulation/Gait  Yes    Ambulation/Gait  Assistance  6: Modified independent (Device/Increase time)    Ambulation Distance (Feet)  115 Feet   x 2 reps, 100 ft x 2   Assistive device  Straight cane    Gait Pattern  Step-through pattern;Decreased dorsiflexion - right;Decreased dorsiflexion - left;Lateral hip instability;Wide base of support;Poor foot clearance - left;Poor foot clearance - right    Ambulation Surface  Level;Indoor    Gait velocity  13.59 sec = 2.4 ft/sec    Stairs  Yes    Stairs Assistance  5: Supervision    Stairs Assistance Details (indicate cue type and reason)  min guard, as pt has less control with descending with LLE is stance on step above    Stair Management Technique  Two rails;Alternating pattern;Forwards    Number of Stairs  4   x 3   Height of Stairs  6    Gait Comments  Cues for increased foot clearance       Standardized Balance Assessment   Standardized Balance Assessment  Timed Up and Go Test      Timed Up and Go Test   TUG  Normal TUG    Normal TUG (seconds)  17.4   15.69     Exercises   Exercises  Knee/Hip      Knee/Hip Exercises: Stretches   Gastroc Stretch  Right;Left;1 rep;30 seconds   each:  runner's stretch and foot propped stretch   Gastroc Stretch Limitations  REview of HEP from last visit-pt return demo understanding      Knee/Hip Exercises: Aerobic   Stepper  ScitFit, Level 1.2, 4 minutes, for leg flexibility and stregnthening, 4 extremities      Knee/Hip Exercises: Standing   Hip Flexion  Stengthening;Right;Left;2 sets;10 reps;Knee bent    Forward Lunges  Right;Left;1 set;10 reps    Forward Lunges Limitations  for lower extremity strengthening to work on eccentric control descending stairs    Side Lunges  Right;Left;1 set;10 reps    Side Lunges Limitations  control for lower extremity strengthening             PT Education - 09/14/18 1733    Education Details  Progress towards goals and POC    Person(s) Educated  Patient    Methods  Explanation    Comprehension  Verbalized understanding       PT Short Term Goals - 09/14/18 0959      PT SHORT TERM GOAL #1   Title  Pt will be independent with HEP for improved strength, balance, gait.  TARGET 09/16/2018    Time  4    Period  Weeks    Status  Achieved    Target Date  09/16/18      PT SHORT TERM GOAL #2   Title  Pt will improve 5x sit<>stand score to less than or equal to 13 seconds, with UE support, for improved transfer efficiency and lower extremity strength.    Baseline  12.69 09/14/2018    Time  4    Period  Weeks    Status  Achieved    Target Date  09/16/18      PT SHORT TERM GOAL #3   Title  Pt will improve TUG score to less than or equal to 15 seconds for decreased fall risk.    Baseline  15.69 09/14/2018    Time  4    Period  Weeks    Status  Partially Met    Target Date  09/16/18  PT  SHORT TERM GOAL #4   Title  Pt will negotiatie at least 4 steps, step through pattern, modified independently for improved safe stair negotiation.    Baseline  supervision/min guard    Time  4    Period  Weeks    Status  Not Met    Target Date  09/16/18      PT SHORT TERM GOAL #5   Title  Pt will verbalize understanding of fall prevention in home environment.    Time  4    Period  Weeks    Status  On-going    Target Date  09/16/18        PT Long Term Goals - 08/17/18 1214      PT LONG TERM GOAL #1   Title  Pt will be independent with progression of HEP for improved strength, balance, gait.  TARGET 10/14/2018    Time  8    Period  Weeks    Status  New    Target Date  10/14/18      PT LONG TERM GOAL #2   Title  Pt will report improved ease of transfers from <18" surfaces by at least 50%, demonstrating improved lower extremity strength.    Time  8    Period  Weeks    Status  New    Target Date  10/14/18      PT LONG TERM GOAL #3   Title  Pt will improve TUG score to less than or equal to 13.5 sec for decreased fall risk.    Time  8    Period  Weeks    Status  New    Target Date  10/14/18      PT LONG TERM GOAL #4   Title  Pt will improve DGI score to at least 19/24 for decreased fall risk.    Time  8    Period  Weeks    Status  New    Target Date  10/14/18      PT LONG TERM GOAL #5   Title  Pt will improve gait velocity to at least 2.62 ft/sec for improved gait efficiency and safety in the community.    Time  8    Period  Weeks    Status  New    Target Date  10/14/18            Plan - 09/14/18 1736    Clinical Impression Statement  Assessed STGs this visit, with pt meeting 2 of 4 goals assessed.  STG 1 and 2 met; STG 3 partially met for TUG score improved, but not to goal level; STG 4 not met for stair negotiation.  STG 5 not addressed today, as pt arrived late.  PT did address pt's arriving late for many appointments; pt acknowledges this and will try to do  better.  He is making slow, steady progress towards goals and will benefit from continued skilled PT towards LTGs.    Comorbidities  Hx of TBI, spinal cord compression, central cord syndrome, peripheral neuropathy, bilateral carpal tunnel syndrome    Examination-Activity Limitations  Dressing;Reach Overhead;Locomotion Level;Transfers    Examination-Participation Restrictions  Community Activity;Other    PT Frequency  2x / week    PT Duration  8 weeks    PT Treatment/Interventions  ADLs/Self Care Home Management;Gait training;Neuromuscular re-education;Balance training;Therapeutic exercise;Therapeutic activities;Functional mobility training;Stair training;Patient/family education    PT Next Visit Plan  Fall prevention edcuation, lower extremity strength (work on eccentric  step downs from lower height steps-difficulty more wtih LLE being stance on upper step), hip/knee flexion in sitting/standing    Consulted and Agree with Plan of Care  Patient       Patient will benefit from skilled therapeutic intervention in order to improve the following deficits and impairments:  Abnormal gait, Decreased balance, Decreased mobility, Decreased strength, Difficulty walking, Impaired flexibility  Visit Diagnosis: Muscle weakness (generalized)  Unsteadiness on feet  Other abnormalities of gait and mobility     Problem List Patient Active Problem List   Diagnosis Date Noted  . Poor mobility 06/24/2018  . Urinary tract infection due to ESBL Klebsiella 06/08/2018  . Recurrent UTI 05/10/2018  . 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 BMI 40.0-44.9, adult ([place]) 11/25/2017  . Cervical disc disease with myelopathy 11/15/2017  . Anemia 10/20/2017  . Benign essential hypertension 10/20/2017  . Constipation 10/20/2017  . Controlled type 2 diabetes mellitus with hyperglycemia, without long-term current use of insulin ([place]) 10/20/2017  . Generalized osteoarthritis of multiple sites 10/20/2017   . Hoarseness or changing voice 10/20/2017  . 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 Upper extremity weakness 06/16/2017  . S/P cervical spinal fusion 04/29/2017  . Bilateral lower extremity edema 01/09/2017  . Tracheostomy dependence ([place]) 05/26/2016  . 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 Bilateral vocal cord paralysis 12/22/2015  . Vocal cord paralysis, unilateral complete 12/22/2015  . Oropharyngeal dysphagia 12/02/2015  . Elevated troponin level 11/19/2015  . Hypertension 11/14/2015  . Type 2 diabetes mellitus, without long-term current use of insulin ([place]) 11/14/2015  . BPH (benign prostatic hyperplasia) 06/14/2014  . Epididymitis, right 05/17/2014  . Hematuria 05/17/2014  . Swollen testicle 05/17/2014  . Testis mass 05/17/2014  . Gross hematuria 10/19/2013  . Central cord syndrome [facility]) 10/13/2012    Laurin Paulo W. 09/14/2018, 5:40 PM  Frazier Butt., PT  [facility] [address] [place], [place], [address] Phone: [phone]   Fax:  [phone]  Name: Pranit Owensby MRN: [id number] Date of Birth: [birth year]

## 2018-09-21 ENCOUNTER — Other Ambulatory Visit: Payer: Self-pay

## 2018-09-21 ENCOUNTER — Ambulatory Visit: Payer: Medicare Other | Admitting: Occupational Therapy

## 2018-09-21 DIAGNOSIS — R2681 Unsteadiness on feet: Secondary | ICD-10-CM

## 2018-09-21 DIAGNOSIS — M25611 Stiffness of right shoulder, not elsewhere classified: Secondary | ICD-10-CM

## 2018-09-21 DIAGNOSIS — R278 Other lack of coordination: Secondary | ICD-10-CM

## 2018-09-21 DIAGNOSIS — M6281 Muscle weakness (generalized): Secondary | ICD-10-CM | POA: Diagnosis not present

## 2018-09-21 DIAGNOSIS — M25612 Stiffness of left shoulder, not elsewhere classified: Secondary | ICD-10-CM

## 2018-09-21 NOTE — Therapy (Signed)
[facility] [address] [place], [place], [address] Phone: [phone]   Fax:  [phone]  Occupational Therapy Evaluation  Patient Details  Name: Dale Key MRN: [id number] Date of Birth: [birth year] Referring Provider (OT): Dr. Ginette Pitman   Encounter Date: 09/21/2018  OT End of Session - 09/21/18 1235    Visit Number  1    Number of Visits  9    Authorization Type  UHC MCR/MCD    Authorization - Visit Number  1    Authorization - Number of Visits  10    OT Start Time  0815   Pt arrived 15 min late   OT Stop Time  0845    OT Time Calculation (min)  30 min    Activity Tolerance  Patient tolerated treatment well    Behavior During Therapy  [facility] for tasks assessed/performed       Past Medical History:  Diagnosis Date  . Diabetes mellitus without complication ([place])   . Glaucoma   . Hyperlipidemia   . Hypertension   . Tracheostomy present [facility])     Past Surgical History:  Procedure Laterality Date  . ANTERIOR CERVICAL DECOMP/DISCECTOMY FUSION N/A 10/07/2012   Procedure: ANTERIOR CERVICAL DECOMPRESSION/DISCECTOMY FUSION 2 LEVELS;  Surgeon: Floyce Stakes, MD;  Location: [place];  Service: Neurosurgery;  Laterality: N/A;  Anterior Cervical Three-Four/Four-Five Decompression and Fusion  . EYE SURGERY    . FOOT SURGERY Left    Pt states a tendon was removed to replace one in right hand.  Marland Kitchen HAND SURGERY Right   . TRACHEOSTOMY      There were no vitals filed for this visit.  Subjective Assessment - 09/21/18 0816    Subjective   No pain today    Pertinent History  h/o central cord syndrome s/p ACDF in 2014 w/ residual Lt hemiparesis, OA multiple sites, HTN, DM    Currently in Pain?  No/denies        [facility] OT Assessment - 09/21/18 0001      Assessment   Medical Diagnosis  muscle weakness, OA multiple sites   s/p ACDF in 2014 w/ residual Lt sided weakness   Referring Provider (OT)  Dr. Ginette Pitman    Onset  Date/Surgical Date  --    Pt reports LUE weakness decline w/in last year   Hand Dominance  Right      Precautions   Precautions  None      Balance Screen   Has the patient fallen in the past 6 months  No    Has the patient had a decrease in activity level because of a fear of falling?   --   see P.T. eval     Home  Environment   Bathroom Shower/Tub  Walk-in Shower   w/ shower chiar   Additional Comments  Pt lives in 2 story home but lives on 1st level, 2-3 steps to enter. Pt is over at girlfriends alot but has elevator to 2nd floor apt    Lives With  Alone      Prior Function   Level of Independence  Independent    Vocation  On disability    Leisure  Enjoys playing piano and guitar, was going to [employer] daily prior to [employer], working on cars      ADL   Eating/Feeding  Independent    Grooming  Independent    Scientist, clinical (histocompatibility and immunogenetics)  Modified independent    Lower Body Bathing  Modified  independent    Upper Body Dressing  Independent    Lower Body Dressing  Modified independent    LexicographerToilet Transfer  Independent    Toileting -  Hygiene  Independent    Tub/Shower Transfer  Modified independent      IADL   Shopping  Shops independently for small purchases    Light Housekeeping  Does personal laundry completely;Performs light daily tasks such as dishwashing, bed making   Hires someone for heavier cleaning   Meal Prep  Able to complete simple cold meal and snack prep;Able to complete simple warm meal prep   girlfriend cooks some, pt does light cooking   Education officer, environmentalCommunity Mobility  Drives own vehicle    Medication Management  Is responsible for taking medication in correct dosages at correct time      Mobility   Mobility Status  Independent   w/ cane when out of house     Written Expression   Dominant Hand  Right      Vision - History   Baseline Vision  Wears glasses only for reading    Visual History  --   laser surgery     Cognition   Overall Cognitive Status  Within  Functional Limits for tasks assessed      Observation/Other Assessments   Observations  walks w/ cane, LUE weakness (since 2014 but worse/steady decline within last year)       Sensation   Light Touch  Appears Intact   for localization LUE   Additional Comments  however diminshed compared to Rt side      Coordination   9 Hole Peg Test  Right;Left    Right 9 Hole Peg Test  32.16 sec    Left 9 Hole Peg Test  40.72 sec      Edema   Edema  none in UE's, just in legs/ankles per pt report      ROM / Strength   AROM / PROM / Strength  AROM      AROM   Overall AROM Comments  RUE: approx 80% flex/abd, ER/IR WFL's, elbow distally WFL's except Rt ring and small finger (due to childhood injuries).  LUE: 60% sh flex/abd, 90% ER/IR, elbow flex 75%, all other distal motion WFL's.       Hand Function   Right Hand Grip (lbs)  70 lbs    Left Hand Grip (lbs)  54 lbs                           OT Long Term Goals - 09/21/18 1241      OT LONG TERM GOAL #1   Title  Independent with HEP for UE ROM, coordination and grip strength Lt hand    Time  4    Period  Weeks    Status  New      OT LONG TERM GOAL #2   Title  Grip strength Lt hand to be 60 lbs or greater    Baseline  54 lbs    Time  4    Period  Weeks    Status  New      OT LONG TERM GOAL #3   Title  Improve coordination Lt hand as evidenced by performing 9 hole peg test in 35 sec or under    Baseline  40.72 sec    Time  4    Period  Weeks    Status  New  OT LONG TERM GOAL #4   Title  Pt to report greater ease w/ washing back of head and overhead dressing using LUE as assist    Time  4    Period  Weeks    Status  New            Plan - 09/21/18 1237    Clinical Impression Statement  Pt is a 63 y.o. male who presents to outpatient rehab w/ dx: muscle weakness. Pt also w/ h/o central cord syndrome s/p ACDF in 2014 and OA multiple sites. Pt reports gradual decline LUE since last year, and generalized  weakness/deconditioning since March 2020 (due to not going to gym from covid restrictions)    Occupational performance deficits (Please refer to evaluation for details):  IADL's;ADL's;Leisure    Body Structure / Function / Physical Skills  IADL;ADL;ROM;Sensation;Body mechanics;Mobility;Strength;Coordination;UE functional use;Pain;Decreased knowledge of use of DME    Rehab Potential  Good    Clinical Decision Making  Limited treatment options, no task modification necessary    Comorbidities Affecting Occupational Performance:  May have comorbidities impacting occupational performance    Modification or Assistance to Complete Evaluation   No modification of tasks or assist necessary to complete eval    OT Frequency  2x / week    OT Duration  4 weeks   plus eval   OT Treatment/Interventions  Self-care/ADL training;Therapeutic exercise;Functional Mobility Training;Neuromuscular education;Manual Therapy;Therapeutic activities;DME and/or AE instruction;Moist Heat;Passive range of motion;Patient/family education    Plan  cane HEP, putty and coordination HEP for Lt hand    Consulted and Agree with Plan of Care  Patient       Patient will benefit from skilled therapeutic intervention in order to improve the following deficits and impairments:   Body Structure / Function / Physical Skills: IADL, ADL, ROM, Sensation, Body mechanics, Mobility, Strength, Coordination, UE functional use, Pain, Decreased knowledge of use of DME       Visit Diagnosis: Stiffness of left shoulder, not elsewhere classified  Stiffness of right shoulder, not elsewhere classified  Unsteadiness on feet  Other lack of coordination  Muscle weakness (generalized)    Problem List Patient Active Problem List   Diagnosis Date Noted  . Poor mobility 06/24/2018  . Urinary tract infection due to ESBL Klebsiella 06/08/2018  . Recurrent UTI 05/10/2018  . 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 BMI 40.0-44.9,  adult (HCC) 11/25/2017  . Cervical disc disease with myelopathy 11/15/2017  . Anemia 10/20/2017  . Benign essential hypertension 10/20/2017  . Constipation 10/20/2017  . Controlled type 2 diabetes mellitus with hyperglycemia, without long-term current use of insulin (HCC) 10/20/2017  . Generalized osteoarthritis of multiple sites 10/20/2017  . Hoarseness or changing voice 10/20/2017  . 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 Upper extremity weakness 06/16/2017  . S/P cervical spinal fusion 04/29/2017  . Bilateral lower extremity edema 01/09/2017  . 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 Bilateral vocal cord paralysis 12/22/2015  . Vocal cord paralysis, unilateral complete 12/22/2015  . Oropharyngeal dysphagia 12/02/2015  . Elevated troponin level 11/19/2015  . Hypertension 11/14/2015  . Type 2 diabetes mellitus, without long-term current use of insulin (HCC) 11/14/2015  . BPH (benign prostatic hyperplasia) 06/14/2014  . Epididymitis, right 05/17/2014  . Hematuria 05/17/2014  . Swollen testicle 05/17/2014  . Testis mass 05/17/2014  . Gross hematuria 10/19/2013  . Central cord syndrome (HCC) 10/13/2012    Kelli ChurnBallie, [place] Kidane Johnson, OTR/L 09/21/2018, 12:49 PM  [facility] [address] [place], [place], [address] Phone: [phone]   Fax:  [phone]  Name: Yashas Dun MRN: [id number] Date of Birth: [birth year]

## 2018-09-23 ENCOUNTER — Ambulatory Visit: Payer: Medicare Other | Admitting: Physical Therapy

## 2018-09-23 ENCOUNTER — Other Ambulatory Visit: Payer: Self-pay

## 2018-09-23 ENCOUNTER — Encounter: Payer: Self-pay | Admitting: Physical Therapy

## 2018-09-23 DIAGNOSIS — M6281 Muscle weakness (generalized): Secondary | ICD-10-CM | POA: Diagnosis not present

## 2018-09-23 DIAGNOSIS — R2681 Unsteadiness on feet: Secondary | ICD-10-CM

## 2018-09-23 DIAGNOSIS — R2689 Other abnormalities of gait and mobility: Secondary | ICD-10-CM

## 2018-09-23 NOTE — Therapy (Signed)
[facility] [address] [place], [place], [address] Phone: [phone]   Fax:  [phone]  Physical Therapy Treatment  Patient Details  Name: Dale Key MRN: [id number] Date of Birth: [birth year] Referring Provider (PT): Tracie Harrier MD   Encounter Date: 09/23/2018  PT End of Session - 09/23/18 1322    Visit Number  8    Number of Visits  17    Date for PT Re-Evaluation  11/15/18    Authorization Type  UHC Medicare/Medicaid    PT Start Time  1157   pt in bathroom before session started   PT Stop Time  1358    PT Time Calculation (min)  40 min    Activity Tolerance  Patient tolerated treatment well;No increased pain    Behavior During Therapy  WFL for tasks assessed/performed       Past Medical History:  Diagnosis Date  . Diabetes mellitus without complication ([place])   . Glaucoma   . Hyperlipidemia   . Hypertension   . Tracheostomy present [facility])     Past Surgical History:  Procedure Laterality Date  . ANTERIOR CERVICAL DECOMP/DISCECTOMY FUSION N/A 10/07/2012   Procedure: ANTERIOR CERVICAL DECOMPRESSION/DISCECTOMY FUSION 2 LEVELS;  Surgeon: Floyce Stakes, MD;  Location: [place];  Service: Neurosurgery;  Laterality: N/A;  Anterior Cervical Three-Four/Four-Five Decompression and Fusion  . EYE SURGERY    . FOOT SURGERY Left    Pt states a tendon was removed to replace one in right hand.  Marland Kitchen HAND SURGERY Right   . TRACHEOSTOMY      There were no vitals filed for this visit.  Subjective Assessment - 09/23/18 1322    Subjective  No new complaints. No falls or pain to report. HEP is going well, still challenging him.    Pertinent History  Hx TBI with central cord syndrome in 2014, with subsequent spinal cord compression and surgery; arthritis    Patient Stated Goals  Pt's goal for therapy is to be able to touch my head with my hands, raise legs better to get into car    Currently in Pain?  No/denies    Pain Score  0-No pain         [facility] PT Assessment - 09/23/18 1324      Timed Up and Go Test   TUG  Normal TUG    Normal TUG (seconds)  11.62   with cane   TUG Comments  Scores >13.5 sec indicate increased fall risk           [facility] Adult PT Treatment/Exercise - 09/23/18 1324      Transfers   Transfers  Sit to Stand;Stand to Sit    Sit to Stand  6: Modified independent (Device/Increase time);With upper extremity assist;Without upper extremity assist;From chair/3-in-1    Stand to Sit  6: Modified independent (Device/Increase time);With upper extremity assist;With armrests;To chair/3-in-1      Ambulation/Gait   Ambulation/Gait  Yes    Ambulation/Gait Assistance  6: Modified independent (Device/Increase time)    Ambulation Distance (Feet)  --   around gym with activity   Assistive device  Straight cane    Gait Pattern  Step-through pattern;Decreased dorsiflexion - right;Decreased dorsiflexion - left;Lateral hip instability;Wide base of support;Poor foot clearance - left;Poor foot clearance - right    Ambulation Surface  Level;Indoor    Stairs  Yes    Stairs Assistance  5: Supervision;6: Modified independent (Device/Increase time)    Stairs Assistance Details (indicate cue  type and reason)  Mod I to ascend with bil rails reciprocally, supervision to descent.     Stair Management Technique  Two rails;Alternating pattern;Forwards    Number of Stairs  4    Height of Stairs  6      Self-Care   Self-Care  Other Self-Care Comments    Other Self-Care Comments   educated on fall prevention strategies with handouts given.       Knee/Hip Exercises: Standing   Forward Step Up  Both;1 set;10 reps;Step Height: 2";Hand Hold: 2;Limitations    Forward Step Up Limitations  cues on form and technique    Step Down  Both;1 set;10 reps;Hand Hold: 2;Step Height: 2";Limitations    Step Down Limitations  cues on form and technique    Other Standing Knee Exercises  sit<>stands no UE support for 10  reps from low surface with cues for full standing and controlled descent          Balance Exercises - 09/23/18 1351      Balance Exercises: Standing   Standing Eyes Closed  Narrow base of support (BOS);Wide (BOA);Head turns;Foam/compliant surface;Solid surface;Other reps (comment);30 secs;Limitations      Balance Exercises: Standing   Standing Eyes Closed Limitations  progressed from floor to on 1 inch foam: wide BOS to narrow BOS on each for EC no head movements, progressing to EC head movements left<>right, up<>doen, min guard to min assist for balance.         PT Education - 09/23/18 1331    Education Details  fall prevention.    Person(s) Educated  Patient    Methods  Explanation;Demonstration;Verbal cues;Handout    Comprehension  Verbalized understanding;Returned demonstration       PT Short Term Goals - 09/23/18 1323      PT SHORT TERM GOAL #1   Title  Pt will be independent with HEP for improved strength, balance, gait.  TARGET 09/16/2018    Baseline  09/14/18- met with current program    Status  Achieved      PT SHORT TERM GOAL #2   Title  Pt will improve 5x sit<>stand score to less than or equal to 13 seconds, with UE support, for improved transfer efficiency and lower extremity strength.    Baseline  12.69 09/14/2018    Status  Achieved      PT SHORT TERM GOAL #3   Title  Pt will improve TUG score to less than or equal to 15 seconds for decreased fall risk.    Baseline  09/23/18: 11.62 ft/sec with cane    Status  Achieved    Target Date  --      PT SHORT TERM GOAL #4   Title  Pt will negotiatie at least 4 steps, step through pattern, modified independently for improved safe stair negotiation.    Baseline  09/23/18: Mod I to ascend wtih rail, supervision to descend    Time  --    Period  --    Status  Partially Met    Target Date  --      PT SHORT TERM GOAL #5   Title  Pt will verbalize understanding of fall prevention in home environment.    Baseline  09/23/18:  met in session today    Time  --    Period  --    Status  Achieved    Target Date  --        PT Long Term Goals - 08/17/18 1214  PT LONG TERM GOAL #1   Title  Pt will be independent with progression of HEP for improved strength, balance, gait.  TARGET 10/14/2018    Time  8    Period  Weeks    Status  New    Target Date  10/14/18      PT LONG TERM GOAL #2   Title  Pt will report improved ease of transfers from <18" surfaces by at least 50%, demonstrating improved lower extremity strength.    Time  8    Period  Weeks    Status  New    Target Date  10/14/18      PT LONG TERM GOAL #3   Title  Pt will improve TUG score to less than or equal to 13.5 sec for decreased fall risk.    Time  8    Period  Weeks    Status  New    Target Date  10/14/18      PT LONG TERM GOAL #4   Title  Pt will improve DGI score to at least 19/24 for decreased fall risk.    Time  8    Period  Weeks    Status  New    Target Date  10/14/18      PT LONG TERM GOAL #5   Title  Pt will improve gait velocity to at least 2.62 ft/sec for improved gait efficiency and safety in the community.    Time  8    Period  Weeks    Status  New    Target Date  10/14/18            Plan - 09/23/18 1323    Clinical Impression Statement  Today's skilled session focused on checking remaining goals, strengthening and balance reactions with no issues reported. The pt is progressing toward goals and should benefit from continued PT to progress toward unmet goals.    Comorbidities  Hx of TBI, spinal cord compression, central cord syndrome, peripheral neuropathy, bilateral carpal tunnel syndrome    Examination-Activity Limitations  Dressing;Reach Overhead;Locomotion Level;Transfers    Examination-Participation Restrictions  Community Activity;Other    PT Frequency  2x / week    PT Duration  8 weeks    PT Treatment/Interventions  ADLs/Self Care Home Management;Gait training;Neuromuscular re-education;Balance  training;Therapeutic exercise;Therapeutic activities;Functional mobility training;Stair training;Patient/family education    PT Next Visit Plan  lower extremity strength (work on eccentric step downs from lower height steps-difficulty more wtih LLE being stance on upper step), hip/knee flexion in sitting/standing    Consulted and Agree with Plan of Care  Patient       Patient will benefit from skilled therapeutic intervention in order to improve the following deficits and impairments:  Abnormal gait, Decreased balance, Decreased mobility, Decreased strength, Difficulty walking, Impaired flexibility  Visit Diagnosis: Muscle weakness (generalized)  Unsteadiness on feet  Other abnormalities of gait and mobility     Problem List Patient Active Problem List   Diagnosis Date Noted  . Poor mobility 06/24/2018  . Urinary tract infection due to ESBL Klebsiella 06/08/2018  . Recurrent UTI 05/10/2018  . 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 BMI 40.0-44.9, adult ([place]) 11/25/2017  . Cervical disc disease with myelopathy 11/15/2017  . Anemia 10/20/2017  . Benign essential hypertension 10/20/2017  . Constipation 10/20/2017  . Controlled type 2 diabetes mellitus with hyperglycemia, without long-term current use of insulin ([place]) 10/20/2017  . Generalized osteoarthritis of multiple sites 10/20/2017  . Hoarseness  or changing voice 10/20/2017  . 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 Upper extremity weakness 06/16/2017  . S/P cervical spinal fusion 04/29/2017  . Bilateral lower extremity edema 01/09/2017  . Tracheostomy dependence ([place]) 05/26/2016  . 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 Bilateral vocal cord paralysis 12/22/2015  . Vocal cord paralysis, unilateral complete 12/22/2015  . Oropharyngeal dysphagia 12/02/2015  . Elevated troponin level 11/19/2015  . Hypertension 11/14/2015  . Type 2 diabetes mellitus, without long-term current use of insulin ([place]) 11/14/2015  .  BPH (benign prostatic hyperplasia) 06/14/2014  . Epididymitis, right 05/17/2014  . Hematuria 05/17/2014  . Swollen testicle 05/17/2014  . Testis mass 05/17/2014  . Gross hematuria 10/19/2013  . Central cord syndrome [facility]) 10/13/2012    Willow Ora, PTA, [facility] [address] [place], [place] [address] [phone] 09/24/18, 5:42 PM   Name: Dale Key MRN: [id number] Date of Birth: 03-Oct-1955

## 2018-09-23 NOTE — Patient Instructions (Addendum)

## 2018-09-27 ENCOUNTER — Other Ambulatory Visit: Payer: Self-pay

## 2018-09-27 ENCOUNTER — Encounter: Payer: Self-pay | Admitting: Occupational Therapy

## 2018-09-27 ENCOUNTER — Ambulatory Visit: Payer: Medicare Other | Admitting: Occupational Therapy

## 2018-09-27 DIAGNOSIS — R2681 Unsteadiness on feet: Secondary | ICD-10-CM

## 2018-09-27 DIAGNOSIS — M6281 Muscle weakness (generalized): Secondary | ICD-10-CM | POA: Diagnosis not present

## 2018-09-27 DIAGNOSIS — M25611 Stiffness of right shoulder, not elsewhere classified: Secondary | ICD-10-CM

## 2018-09-27 DIAGNOSIS — R2689 Other abnormalities of gait and mobility: Secondary | ICD-10-CM

## 2018-09-27 DIAGNOSIS — R278 Other lack of coordination: Secondary | ICD-10-CM

## 2018-09-27 DIAGNOSIS — M25612 Stiffness of left shoulder, not elsewhere classified: Secondary | ICD-10-CM

## 2018-09-27 NOTE — Patient Instructions (Addendum)
   [place], With arms straight, hold cane forward at waist. Raise cane above head. Hold 3 seconds. Repeat 15 times. Do 1 times per day.   ROM: Abduction - Wand   SITTING, Holding wand with left hand palm up, push wand directly out to side, leading with other hand palm down, until stretch is felt. Hold 2 seconds.  Repeat with other arm.  Repeat 10 times.     ROM: Extension - Wand (Standing)   Stand holding wand behind back. Raise arms as far as possible.  PALMS FACING FORWARD. Repeat 10 times per set.  Do 1 sessions per day.   Active Assistive Shoulder External Rotation    SIT with stick at waist level, LEFT palm up, other palm down, push forearm out from body with hand palm down, and keep elbows bent. Hold 2sec return to start position.  Repeat to the other side. Perform 10 reps. 1X DAY     11. Grip Strengthening (Resistive Putty)   Squeeze putty using thumb and all fingers. Repeat 15 times. Do 1-2 sessions per day.   Extension (Assistive Putty)   Roll putty back and forth, being sure to use all fingertips. Repeat 3 times. Do 1-2 sessions per day.  Then pinch as below.   Palmar Pinch Strengthening (Resistive Putty)   Pinch putty between thumb and each fingertip in turn after rolling out.  Repeat 3x     FINGERS: Extension (Putty)    Open hand and fingers to flatten putty. 3 reps per  day

## 2018-09-27 NOTE — Therapy (Signed)
[facility] [address] [place], [place], [address] Phone: [phone]   Fax:  [phone]  Occupational Therapy Treatment  Patient Details  Name: Dale Key MRN: [id number] Date of Birth: 1955-06-10 Referring Provider (OT): Dr. Ginette Pitman   Encounter Date: 09/27/2018  OT End of Session - 09/27/18 0834    Visit Number  2    Number of Visits  [place] MCR/MCD    Authorization - Visit Number  2    Authorization - Number of Visits  10    OT Start Time  0818   pt arrived late   OT Stop Time  0844    OT Time Calculation (min)  26 min    Activity Tolerance  Patient tolerated treatment well    Behavior During Therapy  [facility] for tasks assessed/performed       Past Medical History:  Diagnosis Date  . Diabetes mellitus without complication ([place])   . Glaucoma   . Hyperlipidemia   . Hypertension   . Tracheostomy present [facility])     Past Surgical History:  Procedure Laterality Date  . ANTERIOR CERVICAL DECOMP/DISCECTOMY FUSION N/A 10/07/2012   Procedure: ANTERIOR CERVICAL DECOMPRESSION/DISCECTOMY FUSION 2 LEVELS;  Surgeon: Floyce Stakes, MD;  Location: [facility];  Service: Neurosurgery;  Laterality: N/A;  Anterior Cervical Three-Four/Four-Five Decompression and Fusion  . EYE SURGERY    . FOOT SURGERY Left    Pt states a tendon was removed to replace one in right hand.  Marland Kitchen HAND SURGERY Right   . TRACHEOSTOMY      There were no vitals filed for this visit.  Subjective Assessment - 09/27/18 0818    Subjective   running late    Pertinent History  h/o central cord syndrome s/p ACDF in 2014 w/ residual Lt hemiparesis, OA multiple sites, HTN, DM    Currently in Pain?  No/denies                   OT Education - 09/27/18 0830    Education Details  Cane HEP; Nyoka Cowden Putty HEP--See pt instructions    Person(s) Educated  Patient    Methods  Explanation;Demonstration;Verbal cues;Handout    Comprehension  Verbalized understanding;Returned demonstration;Verbal cues required          OT Long Term Goals - 09/21/18 1241      OT LONG TERM GOAL #1   Title  Independent with HEP for UE ROM, coordination and grip strength Lt hand    Time  4    Period  Weeks    Status  New      OT LONG TERM GOAL #2   Title  Grip strength Lt hand to be 60 lbs or greater    Baseline  54 lbs    Time  4    Period  Weeks    Status  New      OT LONG TERM GOAL #3   Title  Improve coordination Lt hand as evidenced by performing 9 hole peg test in 35 sec or under    Baseline  40.72 sec    Time  4    Period  Weeks    Status  New      OT LONG TERM GOAL #4   Title  Pt to report greater ease w/ washing back of head and overhead dressing using LUE as assist    Time  4    Period  Weeks  Status  New            Plan - 09/27/18 [id number]    Clinical Impression Statement  Pt able to return demo initial HEP for shoulder ROM and hand strength.    Occupational performance deficits (Please refer to evaluation for details):  IADL's;ADL's;Leisure    Body Structure / Function / Physical Skills  IADL;ADL;ROM;Sensation;Body mechanics;Mobility;Strength;Coordination;UE functional use;Pain;Decreased knowledge of use of DME    Rehab Potential  Good    Clinical Decision Making  Limited treatment options, no task modification necessary    Comorbidities Affecting Occupational Performance:  May have comorbidities impacting occupational performance    Modification or Assistance to Complete Evaluation   No modification of tasks or assist necessary to complete eval    OT Frequency  2x / week    OT Duration  4 weeks   plus eval   OT Treatment/Interventions  Self-care/ADL training;Therapeutic exercise;Functional Mobility Training;Neuromuscular education;Manual Therapy;Therapeutic activities;DME and/or AE instruction;Moist Heat;Passive range of motion;Patient/family education    Plan  coordination HEP for L hand;review  HEP (putty/cane)    OT Home Exercise Plan  HEP provided:  green putty, cane HEP for shoulder ROM    Consulted and Agree with Plan of Care  Patient       Patient will benefit from skilled therapeutic intervention in order to improve the following deficits and impairments:   Body Structure / Function / Physical Skills: IADL, ADL, ROM, Sensation, Body mechanics, Mobility, Strength, Coordination, UE functional use, Pain, Decreased knowledge of use of DME       Visit Diagnosis: Muscle weakness (generalized)  Unsteadiness on feet  Stiffness of right shoulder, not elsewhere classified  Other lack of coordination  Stiffness of left shoulder, not elsewhere classified  Other abnormalities of gait and mobility    Problem List Patient Active Problem List   Diagnosis Date Noted  . Poor mobility 06/24/2018  . Urinary tract infection due to ESBL Klebsiella 06/08/2018  . Recurrent UTI 05/10/2018  . 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 BMI 40.0-44.9, adult (HCC) 11/25/2017  . Cervical disc disease with myelopathy 11/15/2017  . Anemia 10/20/2017  . Benign essential hypertension 10/20/2017  . Constipation 10/20/2017  . Controlled type 2 diabetes mellitus with hyperglycemia, without long-term current use of insulin (HCC) 10/20/2017  . Generalized osteoarthritis of multiple sites 10/20/2017  . Hoarseness or changing voice 10/20/2017  . 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 Upper extremity weakness 06/16/2017  . S/P cervical spinal fusion 04/29/2017  . Bilateral lower extremity edema 01/09/2017  . 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 Bilateral vocal cord paralysis 12/22/2015  . Vocal cord paralysis, unilateral complete 12/22/2015  . Oropharyngeal dysphagia 12/02/2015  . Elevated troponin level 11/19/2015  . Hypertension 11/14/2015  . Type 2 diabetes mellitus, without long-term current use of insulin (HCC) 11/14/2015  . BPH  (benign prostatic hyperplasia) 06/14/2014  . Epididymitis, right 05/17/2014  . Hematuria 05/17/2014  . Swollen testicle 05/17/2014  . Testis mass 05/17/2014  . Gross hematuria 10/19/2013  . Central cord syndrome [facility](HCC) 10/13/2012    [facility] Medical CenterFREEMAN,Shakerra Red 09/27/2018, 8:47 AM  [facility] [address] [place], [place], [address] Phone: [phone]   Fax:  [phone]  Name: Annette StableGirrie Lisle MRN: [id number] Date of Birth: 12-28-1955   Willa FraterAngela Yaritsa Savarino, OTR/L [facility] [address] [place], [place]  [id number] [phone] phone [phone] 09/27/18 8:47 AM

## 2018-09-28 ENCOUNTER — Ambulatory Visit: Payer: Medicare Other | Admitting: Physical Therapy

## 2018-09-28 ENCOUNTER — Encounter: Payer: Self-pay | Admitting: Physical Therapy

## 2018-09-28 DIAGNOSIS — M6281 Muscle weakness (generalized): Secondary | ICD-10-CM

## 2018-09-28 DIAGNOSIS — R2689 Other abnormalities of gait and mobility: Secondary | ICD-10-CM

## 2018-09-28 DIAGNOSIS — R2681 Unsteadiness on feet: Secondary | ICD-10-CM

## 2018-09-29 NOTE — Therapy (Signed)
[place] [address] [place], [place], [address] Phone: [phone]   Fax:  [phone]  Physical Therapy Treatment  Patient Details  Name: Dale Key MRN: [id number] Date of Birth: Jul 25, 1955 Referring Provider (PT): Tracie Harrier MD   Encounter Date: 09/28/2018  PT End of Session - 09/28/18 0943    Visit Number  9    Number of Visits  17    Date for PT Re-Evaluation  11/15/18    Authorization Type  [employer] Medicare/Medicaid    PT Start Time  0940   in bathroom prior to session   PT Stop Time  1015    PT Time Calculation (min)  35 min    Equipment Utilized During Treatment  Gait belt    Activity Tolerance  Patient tolerated treatment well;No increased pain    Behavior During Therapy  WFL for tasks assessed/performed       Past Medical History:  Diagnosis Date  . Diabetes mellitus without complication ([place])   . Glaucoma   . Hyperlipidemia   . Hypertension   . Tracheostomy present [facility])     Past Surgical History:  Procedure Laterality Date  . ANTERIOR CERVICAL DECOMP/DISCECTOMY FUSION N/A 10/07/2012   Procedure: ANTERIOR CERVICAL DECOMPRESSION/DISCECTOMY FUSION 2 LEVELS;  Surgeon: Floyce Stakes, MD;  Location: [facility];  Service: Neurosurgery;  Laterality: N/A;  Anterior Cervical Three-Four/Four-Five Decompression and Fusion  . EYE SURGERY    . FOOT SURGERY Left    Pt states a tendon was removed to replace one in right hand.  Marland Kitchen HAND SURGERY Right   . TRACHEOSTOMY      There were no vitals filed for this visit.  Subjective Assessment - 09/28/18 0943    Subjective  No new complaitns. No falls or pain to report.    Pertinent History  Hx TBI with central cord syndrome in 2014, with subsequent spinal cord compression and surgery; arthritis          [facility] Adult PT Treatment/Exercise - 09/28/18 0944      High Level Balance   High Level Balance Activities  Side stepping;Marching forwards;Marching  backwards;Tandem walking   tandem fwd/bwd   High Level Balance Comments  on blue mat in parallel bars: 3 laps each with light UE support, cues on form/posture. min guard assist for balance.       Neuro Re-ed    Neuro Re-ed Details   for balance/muscle re-ed: on airex with feet apart, progressing to feet together for EC no head movements; back to feet apart for head movements left<>right, up<>down and diagonal both ways. min guard to min assist with no UE support.       Knee/Hip Exercises: Aerobic   Other Aerobic  Scifit UE/LE on level 2.8 for 5 minutes with goal >/=50 for strengthening/activity tolerance.       Knee/Hip Exercises: Standing   Forward Step Up  Both;1 set;10 reps;Hand Hold: 1;Step Height: 4";Limitations    Forward Step Up Limitations  cues on form and technique    Step Down  Both;1 set;10 reps;Hand Hold: 2;Step Height: 4";Limitations    Step Down Limitations  cues on form and technique    Other Standing Knee Exercises  standing at bottom of stairs: foot taps up/down bottom 2 stairs with cues on hip/knee flexion for 10 reps each side with bil UE support on bars.                PT Short Term Goals - 09/23/18  [place] #1   Title  Pt will be independent with HEP for improved strength, balance, gait.  TARGET 09/16/2018    Baseline  09/14/18- met with current program    Status  Achieved      PT SHORT TERM GOAL #2   Title  Pt will improve 5x sit<>stand score to less than or equal to 13 seconds, with UE support, for improved transfer efficiency and lower extremity strength.    Baseline  12.69 09/14/2018    Status  Achieved      PT SHORT TERM GOAL #3   Title  Pt will improve TUG score to less than or equal to 15 seconds for decreased fall risk.    Baseline  09/23/18: 11.62 ft/sec with cane    Status  Achieved    Target Date  --      PT SHORT TERM GOAL #4   Title  Pt will negotiatie at least 4 steps, step through pattern, modified independently for  improved safe stair negotiation.    Baseline  09/23/18: Mod I to ascend wtih rail, supervision to descend    Time  --    Period  --    Status  Partially Met    Target Date  --      PT SHORT TERM GOAL #5   Title  Pt will verbalize understanding of fall prevention in home environment.    Baseline  09/23/18: met in session today    Time  --    Period  --    Status  Achieved    Target Date  --        PT Long Term Goals - 08/17/18 1214      PT LONG TERM GOAL #1   Title  Pt will be independent with progression of HEP for improved strength, balance, gait.  TARGET 10/14/2018    Time  8    Period  Weeks    Status  New    Target Date  10/14/18      PT LONG TERM GOAL #2   Title  Pt will report improved ease of transfers from <18" surfaces by at least 50%, demonstrating improved lower extremity strength.    Time  8    Period  Weeks    Status  New    Target Date  10/14/18      PT LONG TERM GOAL #3   Title  Pt will improve TUG score to less than or equal to 13.5 sec for decreased fall risk.    Time  8    Period  Weeks    Status  New    Target Date  10/14/18      PT LONG TERM GOAL #4   Title  Pt will improve DGI score to at least 19/24 for decreased fall risk.    Time  8    Period  Weeks    Status  New    Target Date  10/14/18      PT LONG TERM GOAL #5   Title  Pt will improve gait velocity to at least 2.62 ft/sec for improved gait efficiency and safety in the community.    Time  8    Period  Weeks    Status  New    Target Date  10/14/18            Plan - 09/28/18 0944    Clinical Impression Statement  Today's skilled  session continued to focus on strengthening and balance reaction with a few rest breaks needed throughout session. No increased pain reported. The pt is progressing toward goals and should benefit from continued PT to progress toward unmet goals.    Comorbidities  Hx of TBI, spinal cord compression, central cord syndrome, peripheral neuropathy, bilateral  carpal tunnel syndrome    Examination-Activity Limitations  Dressing;Reach Overhead;Locomotion Level;Transfers    Examination-Participation Restrictions  Community Activity;Other    PT Frequency  2x / week    PT Duration  8 weeks    PT Treatment/Interventions  ADLs/Self Care Home Management;Gait training;Neuromuscular re-education;Balance training;Therapeutic exercise;Therapeutic activities;Functional mobility training;Stair training;Patient/family education    PT Next Visit Plan  10th visit progress note due next session; lower extremity strength (work on eccentric step downs from lower height steps-difficulty more wtih LLE being stance on upper step), hip/knee flexion in sitting/standing    Consulted and Agree with Plan of Care  Patient       Patient will benefit from skilled therapeutic intervention in order to improve the following deficits and impairments:  Abnormal gait, Decreased balance, Decreased mobility, Decreased strength, Difficulty walking, Impaired flexibility  Visit Diagnosis: Muscle weakness (generalized)  Unsteadiness on feet  Other abnormalities of gait and mobility     Problem List Patient Active Problem List   Diagnosis Date Noted  . Poor mobility 06/24/2018  . Urinary tract infection due to ESBL Klebsiella 06/08/2018  . Recurrent UTI 05/10/2018  . 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 BMI 40.0-44.9, adult ([place]) 11/25/2017  . Cervical disc disease with myelopathy 11/15/2017  . Anemia 10/20/2017  . Benign essential hypertension 10/20/2017  . Constipation 10/20/2017  . Controlled type 2 diabetes mellitus with hyperglycemia, without long-term current use of insulin ([place]) 10/20/2017  . Generalized osteoarthritis of multiple sites 10/20/2017  . Hoarseness or changing voice 10/20/2017  . 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 Upper extremity weakness 06/16/2017  . S/P cervical spinal fusion 04/29/2017  . Bilateral lower extremity edema 01/09/2017  .  Tracheostomy dependence ([place]) 05/26/2016  . 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 Bilateral vocal cord paralysis 12/22/2015  . Vocal cord paralysis, unilateral complete 12/22/2015  . Oropharyngeal dysphagia 12/02/2015  . Elevated troponin level 11/19/2015  . Hypertension 11/14/2015  . Type 2 diabetes mellitus, without long-term current use of insulin ([place]) 11/14/2015  . BPH (benign prostatic hyperplasia) 06/14/2014  . Epididymitis, right 05/17/2014  . Hematuria 05/17/2014  . Swollen testicle 05/17/2014  . Testis mass 05/17/2014  . Gross hematuria 10/19/2013  . Central cord syndrome [facility]) 10/13/2012    Willow Ora, PTA, [place] [address], [place], [place] [address] [phone] 09/29/18, 1:16 PM   Name: Alston Berrie MRN: [id number] Date of Birth: 1955/07/11

## 2018-09-30 ENCOUNTER — Ambulatory Visit: Payer: Medicare Other | Admitting: Physical Therapy

## 2018-09-30 ENCOUNTER — Other Ambulatory Visit: Payer: Self-pay

## 2018-09-30 ENCOUNTER — Encounter: Payer: Self-pay | Admitting: Physical Therapy

## 2018-09-30 DIAGNOSIS — M6281 Muscle weakness (generalized): Secondary | ICD-10-CM | POA: Diagnosis not present

## 2018-09-30 DIAGNOSIS — R2681 Unsteadiness on feet: Secondary | ICD-10-CM

## 2018-09-30 DIAGNOSIS — R2689 Other abnormalities of gait and mobility: Secondary | ICD-10-CM

## 2018-10-01 NOTE — Therapy (Addendum)
[facility] [address] [place], [place], [address] Phone: [phone]   Fax:  [phone]  Physical Therapy Treatment/10th Visit Progress Note  Patient Details  Name: Derreon Consalvo MRN: [id number] Date of Birth: Dec 26, 1955 Referring Provider (PT): Tracie Harrier MD   Encounter Date: 09/30/2018   09/30/18 1242  PT Visits / Re-Eval  Visit Number 10  Number of Visits 17  Date for PT Re-Evaluation 11/15/18  Authorization  Authorization Type [employer] Medicare/Medicaid  PT Time Calculation  PT Start Time 3007 (pt late for appt today)  PT Stop Time 1313  PT Time Calculation (min) 33 min  PT - End of Session  Equipment Utilized During Treatment Gait belt  Activity Tolerance Patient tolerated treatment well;No increased pain  Behavior During Therapy WFL for tasks assessed/performed     Past Medical History:  Diagnosis Date  . Diabetes mellitus without complication ([place])   . Glaucoma   . Hyperlipidemia   . Hypertension   . Tracheostomy present [facility])     Past Surgical History:  Procedure Laterality Date  . ANTERIOR CERVICAL DECOMP/DISCECTOMY FUSION N/A 10/07/2012   Procedure: ANTERIOR CERVICAL DECOMPRESSION/DISCECTOMY FUSION 2 LEVELS;  Surgeon: Floyce Stakes, MD;  Location: [facility];  Service: Neurosurgery;  Laterality: N/A;  Anterior Cervical Three-Four/Four-Five Decompression and Fusion  . EYE SURGERY    . FOOT SURGERY Left    Pt states a tendon was removed to replace one in right hand.  Marland Kitchen HAND SURGERY Right   . TRACHEOSTOMY      There were no vitals filed for this visit.     09/30/18 1242  Symptoms/Limitations  Subjective No new complaints. No falls or pain to report.  Pertinent History Hx TBI with central cord syndrome in 2014, with subsequent spinal cord compression and surgery; arthritis  Patient Stated Goals Pt's goal for therapy is to be able to touch my head with my hands, raise legs better to get  into car  Pain Assessment  Currently in Pain? No/denies  Pain Score 0      09/30/18 1243  Transfers  Transfers Sit to Stand;Stand to Sit  Sit to Stand 6: Modified independent (Device/Increase time);With upper extremity assist;Without upper extremity assist;From chair/3-in-1  Stand to Sit 6: Modified independent (Device/Increase time);With upper extremity assist;With armrests;To chair/3-in-1  Ambulation/Gait  Ambulation/Gait Yes  Ambulation/Gait Assistance 4: Min guard  Ambulation/Gait Assistance Details had pt scanning enviroment left, right, up, down in various/random directions, then worked on speed changes.   Ambulation Distance (Feet) 230 Feet (x1, plus around gym with activity)  Assistive device Straight cane  Gait Pattern Step-through pattern;Decreased dorsiflexion - right;Decreased dorsiflexion - left;Lateral hip instability;Wide base of support;Poor foot clearance - left;Poor foot clearance - right  Ambulation Surface Level;Indoor  Knee/Hip Exercises: Aerobic  Other Aerobic Scifit UE/LE on level 3.5 for 5 minutes with goal >/=50 for strengthening/activity tolerance.        09/30/18 1257  Balance Exercises: Standing  Standing Eyes Closed Narrow base of support (BOS);Wide (BOA);Head turns;Foam/compliant surface;Other reps (comment);30 secs;Limitations  Balance Beam standing across blue foam beam: alternating fwd stepping to floor/back onto beam, then alternating bwd stepping to floor/back onto beam for 10 reps each, cues on increased step length/step height with cues on weight shifting as well .  Tandem Gait Forward;Retro;Upper extremity support;Foam/compliant surface;3 reps;Limitations  Sidestepping Foam/compliant support;3 reps;Limitations  Balance Exercises: Standing  Sidestepping Limitations on blue foam beam with light UE support  Tandem Gait Limitations on blue foam beam with  light UE support  Standing Eyes Closed Limitations standing across blue foam beam: narrow BOS  with EC no head movements, then with wide BOS for head movements left<>right, up<>down and diagonal both ways for 10 reps each. min guard to min assist for balance with cues on posture and weight shifting to assist with balance.        PT Short Term Goals - 09/23/18 1323      PT SHORT TERM GOAL #1   Title  Pt will be independent with HEP for improved strength, balance, gait.  TARGET 09/16/2018    Baseline  09/14/18- met with current program    Status  Achieved      PT SHORT TERM GOAL #2   Title  Pt will improve 5x sit<>stand score to less than or equal to 13 seconds, with UE support, for improved transfer efficiency and lower extremity strength.    Baseline  12.69 09/14/2018    Status  Achieved      PT SHORT TERM GOAL #3   Title  Pt will improve TUG score to less than or equal to 15 seconds for decreased fall risk.    Baseline  09/23/18: 11.62 ft/sec with cane    Status  Achieved    Target Date  --      PT SHORT TERM GOAL #4   Title  Pt will negotiatie at least 4 steps, step through pattern, modified independently for improved safe stair negotiation.    Baseline  09/23/18: Mod I to ascend wtih rail, supervision to descend    Time  --    Period  --    Status  Partially Met    Target Date  --      PT SHORT TERM GOAL #5   Title  Pt will verbalize understanding of fall prevention in home environment.    Baseline  09/23/18: met in session today    Time  --    Period  --    Status  Achieved    Target Date  --        PT Long Term Goals - 08/17/18 1214      PT LONG TERM GOAL #1   Title  Pt will be independent with progression of HEP for improved strength, balance, gait.  TARGET 10/14/2018    Time  8    Period  Weeks    Status  New    Target Date  10/14/18      PT LONG TERM GOAL #2   Title  Pt will report improved ease of transfers from <18" surfaces by at least 50%, demonstrating improved lower extremity strength.    Time  8    Period  Weeks    Status  New    Target Date   10/14/18      PT LONG TERM GOAL #3   Title  Pt will improve TUG score to less than or equal to 13.5 sec for decreased fall risk.    Time  8    Period  Weeks    Status  New    Target Date  10/14/18      PT LONG TERM GOAL #4   Title  Pt will improve DGI score to at least 19/24 for decreased fall risk.    Time  8    Period  Weeks    Status  New    Target Date  10/14/18      PT LONG TERM GOAL #5   Title  Pt will improve gait velocity to at least 2.62 ft/sec for improved gait efficiency and safety in the community.    Time  8    Period  Weeks    Status  New    Target Date  10/14/18          09/30/18 1242  Plan  Clinical Impression Statement Today's skilled session continued to focus on strengthening and high level balance reactions with rest breaks needed due to fatigue. The pt is progressing toward goals and should benefit from continued PT to progress toward unmet goals.  Comorbidities Hx of TBI, spinal cord compression, central cord syndrome, peripheral neuropathy, bilateral carpal tunnel syndrome  Examination-Activity Limitations Dressing;Reach Overhead;Locomotion Level;Transfers  Examination-Participation Restrictions Community Activity;Other  Pt will benefit from skilled therapeutic intervention in order to improve on the following deficits Abnormal gait;Decreased balance;Decreased mobility;Decreased strength;Difficulty walking;Impaired flexibility  PT Frequency 2x / week  PT Duration 8 weeks  PT Treatment/Interventions ADLs/Self Care Home Management;Gait training;Neuromuscular re-education;Balance training;Therapeutic exercise;Therapeutic activities;Functional mobility training;Stair training;Patient/family education  PT Next Visit Plan lower extremity strength (work on eccentric step downs from lower height steps-difficulty more wtih LLE being stance on upper step), hip/knee flexion in sitting/standing  Consulted and Agree with Plan of Care Patient         Patient will  benefit from skilled therapeutic intervention in order to improve the following deficits and impairments:  Abnormal gait, Decreased balance, Decreased mobility, Decreased strength, Difficulty walking, Impaired flexibility  Visit Diagnosis: Muscle weakness (generalized)  Unsteadiness on feet  Other abnormalities of gait and mobility     Problem List Patient Active Problem List   Diagnosis Date Noted  . Poor mobility 06/24/2018  . Urinary tract infection due to ESBL Klebsiella 06/08/2018  . Recurrent UTI 05/10/2018  . 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 BMI 40.0-44.9, adult ([place]) 11/25/2017  . Cervical disc disease with myelopathy 11/15/2017  . Anemia 10/20/2017  . Benign essential hypertension 10/20/2017  . Constipation 10/20/2017  . Controlled type 2 diabetes mellitus with hyperglycemia, without long-term current use of insulin ([place]) 10/20/2017  . Generalized osteoarthritis of multiple sites 10/20/2017  . Hoarseness or changing voice 10/20/2017  . 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 Upper extremity weakness 06/16/2017  . S/P cervical spinal fusion 04/29/2017  . Bilateral lower extremity edema 01/09/2017  . 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 Bilateral vocal cord paralysis 12/22/2015  . Vocal cord paralysis, unilateral complete 12/22/2015  . Oropharyngeal dysphagia 12/02/2015  . Elevated troponin level 11/19/2015  . Hypertension 11/14/2015  . Type 2 diabetes mellitus, without long-term current use of insulin ([place]) 11/14/2015  . BPH (benign prostatic hyperplasia) 06/14/2014  . Epididymitis, right 05/17/2014  . Hematuria 05/17/2014  . Swollen testicle 05/17/2014  . Testis mass 05/17/2014  . Gross hematuria 10/19/2013  . Central cord syndrome [facility]) 10/13/2012    Willow Ora, PTA, [facility] [address], [place], [place] [address] [phone] 10/01/18, 9:20 PM   Name:  Edman Lipsey MRN: [id number] Date of Birth: 09-26-1955         10th Visit PROGRESS NOTE  Dates of PTservice:  08/17/2018-09/30/2018  Objective Measures: 5x sit<>stand 12.69 sec, TUG 11.62 sec  Progress to goals:  Pt has met 4 of 5 STGs.  STGs 1-3, 5 were met; STG 4 partially met, with pt requiring supervision for descending stairs with step through pattern.  Pt is progressing with objective measures during the course  of PT.  He is progressing towards LTGs, which remain appropriate.  He will continue to benefit from skilled PT to address strength, balance and gait training for improved overall functional mobility and independence.  Mady Haagensen, PT 10/07/18 10:31 AM Phone: [phone] Fax: [phone]

## 2018-10-03 ENCOUNTER — Encounter: Payer: Self-pay | Admitting: Physical Therapy

## 2018-10-03 ENCOUNTER — Ambulatory Visit: Payer: Medicare Other | Admitting: Physical Therapy

## 2018-10-03 ENCOUNTER — Other Ambulatory Visit: Payer: Self-pay

## 2018-10-03 ENCOUNTER — Ambulatory Visit: Payer: Medicare Other | Admitting: Occupational Therapy

## 2018-10-03 DIAGNOSIS — M6281 Muscle weakness (generalized): Secondary | ICD-10-CM

## 2018-10-03 DIAGNOSIS — R2681 Unsteadiness on feet: Secondary | ICD-10-CM

## 2018-10-03 DIAGNOSIS — R2689 Other abnormalities of gait and mobility: Secondary | ICD-10-CM

## 2018-10-03 DIAGNOSIS — R278 Other lack of coordination: Secondary | ICD-10-CM

## 2018-10-03 DIAGNOSIS — M25611 Stiffness of right shoulder, not elsewhere classified: Secondary | ICD-10-CM

## 2018-10-03 DIAGNOSIS — M25612 Stiffness of left shoulder, not elsewhere classified: Secondary | ICD-10-CM

## 2018-10-03 NOTE — Therapy (Signed)
[facility] [address] [place], [place], [address] Phone: [phone]   Fax:  [phone]  Physical Therapy Treatment  Patient Details  Name: Dale Key MRN: [id number] Date of Birth: 03-22-1955 Referring Provider (PT): Tracie Harrier MD   Encounter Date: 10/03/2018  PT End of Session - 10/03/18 1106    Visit Number  11    Number of Visits  17    Date for PT Re-Evaluation  11/15/18    Authorization Type  [employer]HC Medicare/Medicaid    PT Start Time  1102    PT Stop Time  1140    PT Time Calculation (min)  38 min    Equipment Utilized During Treatment  Gait belt    Activity Tolerance  Patient tolerated treatment well;No increased pain    Behavior During Therapy  WFL for tasks assessed/performed       Past Medical History:  Diagnosis Date  . Diabetes mellitus without complication ([place])   . Glaucoma   . Hyperlipidemia   . Hypertension   . Tracheostomy present [facility])     Past Surgical History:  Procedure Laterality Date  . ANTERIOR CERVICAL DECOMP/DISCECTOMY FUSION N/A 10/07/2012   Procedure: ANTERIOR CERVICAL DECOMPRESSION/DISCECTOMY FUSION 2 LEVELS;  Surgeon: Floyce Stakes, MD;  Location: [facility];  Service: Neurosurgery;  Laterality: N/A;  Anterior Cervical Three-Four/Four-Five Decompression and Fusion  . EYE SURGERY    . FOOT SURGERY Left    Pt states a tendon was removed to replace one in right hand.  Marland Kitchen HAND SURGERY Right   . TRACHEOSTOMY      There were no vitals filed for this visit.  Subjective Assessment - 10/03/18 1106    Subjective  No new complaints. No falls or pain to report.    Pertinent History  Hx TBI with central cord syndrome in 2014, with subsequent spinal cord compression and surgery; arthritis    Patient Stated Goals  Pt's goal for therapy is to be able to touch my head with my hands, raise legs better to get into car    Currently in Pain?  No/denies    Pain Score  0-No pain           [facility] Adult PT Treatment/Exercise - 10/03/18 1108      Transfers   Transfers  Sit to Stand;Stand to Sit    Sit to Stand  6: Modified independent (Device/Increase time);With upper extremity assist;Without upper extremity assist;From chair/3-in-1    Stand to Sit  6: Modified independent (Device/Increase time);With upper extremity assist;With armrests;To chair/3-in-1      Ambulation/Gait   Ambulation/Gait  Yes    Ambulation/Gait Assistance  5: Supervision    Ambulation/Gait Assistance Details  increased shuffling of gait noted as pt fatigues. cues for posuture and increased foot clearance with gait bil LE's.     Ambulation Distance (Feet)  --   around gym with activity   Assistive device  Straight cane    Gait Pattern  Step-through pattern;Decreased dorsiflexion - right;Decreased dorsiflexion - left;Lateral hip instability;Wide base of support;Poor foot clearance - left;Poor foot clearance - right    Ambulation Surface  Level;Indoor      High Level Balance   High Level Balance Activities  Side stepping;Marching forwards;Marching backwards;Tandem walking   tandem/toe walk fwd/bwd   High Level Balance Comments  on red mat next to counter: 3 laps each with light UE support on counter/min guard to min assist for balance. cues on posture and ex form/technique.  Knee/Hip Exercises: Aerobic   Other Aerobic  Scifit UE/LE on level 3.5 for 6 minutes with goal >/=50 for strengthening/activity tolerance.           Balance Exercises - 10/03/18 1127      Balance Exercises: Standing   Standing Eyes Closed  Narrow base of support (BOS);Head turns;Foam/compliant surface;Other reps (comment);30 secs;Limitations    SLS with Vectors  Foam/compliant surface;Upper extremity assist 1;Other reps (comment);Limitations    Other Standing Exercises  seated with feet across red foam beam: sit<>stands for 2 sets of 10 reps with emphasis on tall posture and slow, controlled descent with sitting  down. min guard to min assist for balance.      Balance Exercises: Standing   Standing Eyes Closed Limitations  on airex with feet together: EC no head movements, progressing to EC head movements left<>right, up<>down and diagonals both ways for 10 reps each. min guard to min assist for balance, cues on posture and weight shifting for balance assistance.     SLS with Vectors Limitations  on airex with 2 tall cones in front, left UE support on chair back- alternating fwd foot taps, then alternating cross foot taps. 10 reps each bil LE's with cues on posture, to keep wide base of support and for weight shifting.           PT Short Term Goals - 09/23/18 1323      PT SHORT TERM GOAL #1   Title  Pt will be independent with HEP for improved strength, balance, gait.  TARGET 09/16/2018    Baseline  09/14/18- met with current program    Status  Achieved      PT SHORT TERM GOAL #2   Title  Pt will improve 5x sit<>stand score to less than or equal to 13 seconds, with UE support, for improved transfer efficiency and lower extremity strength.    Baseline  12.69 09/14/2018    Status  Achieved      PT SHORT TERM GOAL #3   Title  Pt will improve TUG score to less than or equal to 15 seconds for decreased fall risk.    Baseline  09/23/18: 11.62 ft/sec with cane    Status  Achieved    Target Date  --      PT SHORT TERM GOAL #4   Title  Pt will negotiatie at least 4 steps, step through pattern, modified independently for improved safe stair negotiation.    Baseline  09/23/18: Mod I to ascend wtih rail, supervision to descend    Time  --    Period  --    Status  Partially Met    Target Date  --      PT SHORT TERM GOAL #5   Title  Pt will verbalize understanding of fall prevention in home environment.    Baseline  09/23/18: met in session today    Time  --    Period  --    Status  Achieved    Target Date  --        PT Long Term Goals - 08/17/18 1214      PT LONG TERM GOAL #1   Title  Pt will be  independent with progression of HEP for improved strength, balance, gait.  TARGET 10/14/2018    Time  8    Period  Weeks    Status  New    Target Date  10/14/18      PT LONG TERM GOAL #2  Title  Pt will report improved ease of transfers from <18" surfaces by at least 50%, demonstrating improved lower extremity strength.    Time  8    Period  Weeks    Status  New    Target Date  10/14/18      PT LONG TERM GOAL #3   Title  Pt will improve TUG score to less than or equal to 13.5 sec for decreased fall risk.    Time  8    Period  Weeks    Status  New    Target Date  10/14/18      PT LONG TERM GOAL #4   Title  Pt will improve DGI score to at least 19/24 for decreased fall risk.    Time  8    Period  Weeks    Status  New    Target Date  10/14/18      PT LONG TERM GOAL #5   Title  Pt will improve gait velocity to at least 2.62 ft/sec for improved gait efficiency and safety in the community.    Time  8    Period  Weeks    Status  New    Target Date  10/14/18            Plan - 10/03/18 1107    Clinical Impression Statement  Today's skilled session continued to focus on LE strengthening and balance reactions with rest breaks needed due to fatigue. No pain reported with session. The pt is making steady progress and should benefit from continued PT to progress toward unmet goals.    Comorbidities  Hx of TBI, spinal cord compression, central cord syndrome, peripheral neuropathy, bilateral carpal tunnel syndrome    Examination-Activity Limitations  Dressing;Reach Overhead;Locomotion Level;Transfers    Examination-Participation Restrictions  Community Activity;Other    PT Frequency  2x / week    PT Duration  8 weeks    PT Treatment/Interventions  ADLs/Self Care Home Management;Gait training;Neuromuscular re-education;Balance training;Therapeutic exercise;Therapeutic activities;Functional mobility training;Stair training;Patient/family education    PT Next Visit Plan  lower extremity  strength (work on eccentric step downs from lower height steps-difficulty more wtih LLE being stance on upper step), hip/knee flexion in sitting/standing    Consulted and Agree with Plan of Care  Patient       Patient will benefit from skilled therapeutic intervention in order to improve the following deficits and impairments:  Abnormal gait, Decreased balance, Decreased mobility, Decreased strength, Difficulty walking, Impaired flexibility  Visit Diagnosis: Muscle weakness (generalized)  Unsteadiness on feet  Other abnormalities of gait and mobility     Problem List Patient Active Problem List   Diagnosis Date Noted  . Poor mobility 06/24/2018  . Urinary tract infection due to ESBL Klebsiella 06/08/2018  . Recurrent UTI 05/10/2018  . 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 BMI 40.0-44.9, adult ([place]) 11/25/2017  . Cervical disc disease with myelopathy 11/15/2017  . Anemia 10/20/2017  . Benign essential hypertension 10/20/2017  . Constipation 10/20/2017  . Controlled type 2 diabetes mellitus with hyperglycemia, without long-term current use of insulin ([place]) 10/20/2017  . Generalized osteoarthritis of multiple sites 10/20/2017  . Hoarseness or changing voice 10/20/2017  . 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 Upper extremity weakness 06/16/2017  . S/P cervical spinal fusion 04/29/2017  . Bilateral lower extremity edema 01/09/2017  . Tracheostomy dependence ([place]) 05/26/2016  . 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 Bilateral vocal cord paralysis 12/22/2015  . Vocal cord paralysis,  unilateral complete 12/22/2015  . Oropharyngeal dysphagia 12/02/2015  . Elevated troponin level 11/19/2015  . Hypertension 11/14/2015  . Type 2 diabetes mellitus, without long-term current use of insulin ([place]) 11/14/2015  . BPH (benign prostatic hyperplasia) 06/14/2014  . Epididymitis, right 05/17/2014  . Hematuria 05/17/2014  . Swollen testicle 05/17/2014  . Testis mass 05/17/2014   . Gross hematuria 10/19/2013  . Central cord syndrome [facility]) 10/13/2012    Willow Ora, PTA, [facility] [address] [place], [place] [address] [phone] 10/03/18, 4:03 PM   Name: Dale Key MRN: [id number] Date of Birth: 09-Jun-1955

## 2018-10-03 NOTE — Therapy (Signed)
[facility] [employer] [address] [place], [place], [address] Phone: [phone]   Fax:  [phone]  Occupational Therapy Treatment  Patient Details  Name: Dale Key MRN: [id number] Date of Birth: Oct 16, 1955 Referring Provider (OT): Dr. Marcello Fennel   Encounter Date: 10/03/2018  OT End of Session - 10/03/18 1256    Visit Number  3    Number of Visits  9    Authorization Type  UHC MCR/MCD    Authorization - Visit Number  3    Authorization - Number of Visits  10    OT Start Time  1148    OT Stop Time  1230    OT Time Calculation (min)  42 min    Activity Tolerance  Patient tolerated treatment well    Behavior During Therapy  [facility] for tasks assessed/performed       Past Medical History:  Diagnosis Date  . Diabetes mellitus without complication (HCC)   . Glaucoma   . Hyperlipidemia   . Hypertension   . Tracheostomy present [facility])     Past Surgical History:  Procedure Laterality Date  . ANTERIOR CERVICAL DECOMP/DISCECTOMY FUSION N/A 10/07/2012   Procedure: ANTERIOR CERVICAL DECOMPRESSION/DISCECTOMY FUSION 2 LEVELS;  Surgeon: Karn Cassis, MD;  Location: [facility] OR;  Service: Neurosurgery;  Laterality: N/A;  Anterior Cervical Three-Four/Four-Five Decompression and Fusion  . EYE SURGERY    . FOOT SURGERY Left    Pt states a tendon was removed to replace one in right hand.  Marland Kitchen HAND SURGERY Right   . TRACHEOSTOMY      There were no vitals filed for this visit.  Subjective Assessment - 10/03/18 1150    Subjective   running late - O.T. appointment moved    Pertinent History  h/o central cord syndrome s/p ACDF in 2014 w/ residual Lt hemiparesis, OA multiple sites, HTN, DM    Currently in Pain?  No/denies       Reviewed cane HEP - pt demo each x 10 reps. Reviewed theraputty HEP Issued coordination HEP and practiced each as indicated.                     OT Education - 10/03/18 1216    Education Details   coordination HEP, review of cane and putty HEP    Person(s) Educated  Patient    Methods  Explanation;Demonstration;Handout    Comprehension  Verbalized understanding;Returned demonstration          OT Long Term Goals - 10/03/18 1257      OT LONG TERM GOAL #1   Title  Independent with HEP for UE ROM, coordination and grip strength Lt hand    Time  4    Period  Weeks    Status  Achieved      OT LONG TERM GOAL #2   Title  Grip strength Lt hand to be 60 lbs or greater    Baseline  54 lbs    Time  4    Period  Weeks    Status  On-going      OT LONG TERM GOAL #3   Title  Improve coordination Lt hand as evidenced by performing 9 hole peg test in 35 sec or under    Baseline  40.72 sec    Time  4    Period  Weeks    Status  On-going      OT LONG TERM GOAL #4   Title  Pt to report  greater ease w/ washing back of head and overhead dressing using LUE as assist    Time  4    Period  Weeks    Status  New            Plan - 10/03/18 1257    Clinical Impression Statement  Pt progressing with HEP and steady progress towards goals    Occupational performance deficits (Please refer to evaluation for details):  IADL's;ADL's;Leisure    Body Structure / Function / Physical Skills  IADL;ADL;ROM;Sensation;Body mechanics;Mobility;Strength;Coordination;UE functional use;Pain;Decreased knowledge of use of DME    Rehab Potential  Good    OT Frequency  2x / week    OT Duration  4 weeks    OT Treatment/Interventions  Self-care/ADL training;Therapeutic exercise;Functional Mobility Training;Neuromuscular education;Manual Therapy;Therapeutic activities;DME and/or AE instruction;Moist Heat;Passive range of motion;Patient/family education    Plan  work on ER w/ arms abducted in supine including butterfly stretch, grip strength, coordination    OT Home Exercise Plan  HEP provided:  green putty, cane HEP for shoulder ROM, coordination HEP    Consulted and Agree with Plan of Care  Patient        Patient will benefit from skilled therapeutic intervention in order to improve the following deficits and impairments:   Body Structure / Function / Physical Skills: IADL, ADL, ROM, Sensation, Body mechanics, Mobility, Strength, Coordination, UE functional use, Pain, Decreased knowledge of use of DME       Visit Diagnosis: Muscle weakness (generalized)  Stiffness of right shoulder, not elsewhere classified  Stiffness of left shoulder, not elsewhere classified  Other lack of coordination    Problem List Patient Active Problem List   Diagnosis Date Noted  . Poor mobility 06/24/2018  . Urinary tract infection due to ESBL Klebsiella 06/08/2018  . Recurrent UTI 05/10/2018  . 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 BMI 40.0-44.9, adult ([place]) 11/25/2017  . Cervical disc disease with myelopathy 11/15/2017  . Anemia 10/20/2017  . Benign essential hypertension 10/20/2017  . Constipation 10/20/2017  . Controlled type 2 diabetes mellitus with hyperglycemia, without long-term current use of insulin ([place]) 10/20/2017  . Generalized osteoarthritis of multiple sites 10/20/2017  . Hoarseness or changing voice 10/20/2017  . 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 Upper extremity weakness 06/16/2017  . S/P cervical spinal fusion 04/29/2017  . Bilateral lower extremity edema 01/09/2017  . 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 Bilateral vocal cord paralysis 12/22/2015  . Vocal cord paralysis, unilateral complete 12/22/2015  . Oropharyngeal dysphagia 12/02/2015  . Elevated troponin level 11/19/2015  . Hypertension 11/14/2015  . Type 2 diabetes mellitus, without long-term current use of insulin ([place]) 11/14/2015  . BPH (benign prostatic hyperplasia) 06/14/2014  . Epididymitis, right 05/17/2014  . Hematuria 05/17/2014  . Swollen testicle 05/17/2014  . Testis mass 05/17/2014  . Gross hematuria 10/19/2013  . Central cord syndrome  [facility]) 10/13/2012    Carey Bullocks, OTR/L 10/03/2018, 12:59 PM  [place] [address] [place], [place], [address] Phone: [phone]   Fax:  [phone]  Name: Dale Key MRN: [id number] Date of Birth: August 19, 1955

## 2018-10-03 NOTE — Patient Instructions (Signed)
  Coordination Activities  Perform the following activities for 10 minutes 1-2 times per day with left hand(s).   Rotate ball in fingertips (clockwise and counter-clockwise x 3 full rotations each way).  Toss ball up in air and catch all with Lt hand. Do NOT go after ball if it rolls away   Flip cards 1 at a time as fast as you can.  Deal cards with your thumb (Hold deck in hand and push card off top with thumb).  Rotate ONE card in hand (clockwise and counter-clockwise x 3-5 full rotations each way).  Pick up coins one at a time until you get 5 in your hand, then move coins from palm to fingertips to stack one at a time. Do 3 stacks of 5

## 2018-10-05 ENCOUNTER — Ambulatory Visit: Payer: Medicare Other | Admitting: Occupational Therapy

## 2018-10-05 ENCOUNTER — Ambulatory Visit: Payer: Medicare Other | Admitting: Physical Therapy

## 2018-10-05 ENCOUNTER — Other Ambulatory Visit: Payer: Self-pay

## 2018-10-05 DIAGNOSIS — M25611 Stiffness of right shoulder, not elsewhere classified: Secondary | ICD-10-CM

## 2018-10-05 DIAGNOSIS — M6281 Muscle weakness (generalized): Secondary | ICD-10-CM | POA: Diagnosis not present

## 2018-10-05 DIAGNOSIS — R2681 Unsteadiness on feet: Secondary | ICD-10-CM

## 2018-10-05 DIAGNOSIS — M25612 Stiffness of left shoulder, not elsewhere classified: Secondary | ICD-10-CM

## 2018-10-05 DIAGNOSIS — R2689 Other abnormalities of gait and mobility: Secondary | ICD-10-CM

## 2018-10-05 NOTE — Therapy (Signed)
[facility] [address] [place], [place], [address] Phone: [phone]   Fax:  [phone]  Physical Therapy Treatment/Progress note Progress Note reporting period 08/17/18 to 10/05/18  See below for objective and subjective measurements relating to patients progress with PT.   Patient Details  Name: Dale Key MRN: [id number] Date of Birth: [birth year] Referring Provider (PT): Tracie Harrier MD   Encounter Date: 10/05/2018  PT End of Session - 10/05/18 1148    Visit Number  12    Number of Visits  17    Date for PT Re-Evaluation  11/15/18    Authorization Type  UHC Medicare/Medicaid    PT Start Time  1030   pt 15 min late   PT Stop Time  1100    PT Time Calculation (min)  30 min    Equipment Utilized During Treatment  Gait belt    Activity Tolerance  Patient tolerated treatment well;No increased pain    Behavior During Therapy  WFL for tasks assessed/performed       Past Medical History:  Diagnosis Date  . Diabetes mellitus without complication ([place])   . Glaucoma   . Hyperlipidemia   . Hypertension   . Tracheostomy present [facility])     Past Surgical History:  Procedure Laterality Date  . ANTERIOR CERVICAL DECOMP/DISCECTOMY FUSION N/A 10/07/2012   Procedure: ANTERIOR CERVICAL DECOMPRESSION/DISCECTOMY FUSION 2 LEVELS;  Surgeon: Floyce Stakes, MD;  Location: [place];  Service: Neurosurgery;  Laterality: N/A;  Anterior Cervical Three-Four/Four-Five Decompression and Fusion  . EYE SURGERY    . FOOT SURGERY Left    Pt states a tendon was removed to replace one in right hand.  Marland Kitchen HAND SURGERY Right   . TRACHEOSTOMY      There were no vitals filed for this visit.  Subjective Assessment - 10/05/18 1147    Subjective  denies pain, relays he feels okay overall    Pertinent History  Hx TBI with central cord syndrome in 2014, with subsequent spinal cord compression and surgery; arthritis    Patient Stated Goals   Pt's goal for therapy is to be able to touch my head with my hands, raise legs better to get into car                       [facility] Adult PT Treatment/Exercise - 10/05/18 0001      High Level Balance   High Level Balance Comments  stepping over hurdles step to pattern first progressed to reciprocal pattern up/down X 3 ea in bars, then performed with sidestepping up/down X 3 ea, then walking with head turns up/down and left/right, retro walking, and tandem walking all up/down X 3 in bars with intermit UE support as needed, then balance on airex pad tandem 30 sec X 2 ea.       Knee/Hip Exercises: Aerobic   Other Aerobic  Scifit UE/LE on level 3.5 for 6 minutes with goal >/=50 for strengthening/activity tolerance.       Knee/Hip Exercises: Standing   Step Down Limitations  eccentric step down from 4 inch step X 10 each side               PT Short Term Goals - 09/23/18 1323      PT SHORT TERM GOAL #1   Title  Pt will be independent with HEP for improved strength, balance, gait.  TARGET 09/16/2018    Baseline  09/14/18- met with current program  Status  Achieved      PT SHORT TERM GOAL #2   Title  Pt will improve 5x sit<>stand score to less than or equal to 13 seconds, with UE support, for improved transfer efficiency and lower extremity strength.    Baseline  12.69 09/14/2018    Status  Achieved      PT SHORT TERM GOAL #3   Title  Pt will improve TUG score to less than or equal to 15 seconds for decreased fall risk.    Baseline  09/23/18: 11.62 ft/sec with cane    Status  Achieved    Target Date  --      PT SHORT TERM GOAL #4   Title  Pt will negotiatie at least 4 steps, step through pattern, modified independently for improved safe stair negotiation.    Baseline  09/23/18: Mod I to ascend wtih rail, supervision to descend    Time  --    Period  --    Status  Partially Met    Target Date  --      PT SHORT TERM GOAL #5   Title  Pt will verbalize understanding of  fall prevention in home environment.    Baseline  09/23/18: met in session today    Time  --    Period  --    Status  Achieved    Target Date  --        PT Long Term Goals - 08/17/18 1214      PT LONG TERM GOAL #1   Title  Pt will be independent with progression of HEP for improved strength, balance, gait.  TARGET 10/14/2018    Time  8    Period  Weeks    Status  New    Target Date  10/14/18      PT LONG TERM GOAL #2   Title  Pt will report improved ease of transfers from <18" surfaces by at least 50%, demonstrating improved lower extremity strength.    Time  8    Period  Weeks    Status  New    Target Date  10/14/18      PT LONG TERM GOAL #3   Title  Pt will improve TUG score to less than or equal to 13.5 sec for decreased fall risk.    Time  8    Period  Weeks    Status  New    Target Date  10/14/18      PT LONG TERM GOAL #4   Title  Pt will improve DGI score to at least 19/24 for decreased fall risk.    Time  8    Period  Weeks    Status  New    Target Date  10/14/18      PT LONG TERM GOAL #5   Title  Pt will improve gait velocity to at least 2.62 ft/sec for improved gait efficiency and safety in the community.    Time  8    Period  Weeks    Status  New    Target Date  10/14/18            Plan - 10/05/18 1148    Clinical Impression Statement  Pt late so some activities held due to time contraints. Session focused on dynamic balance and leg strength with good tolerance. Overall he is progressing well with his balance and has met his STG. HE will continue to benefit from skilled PT  Comorbidities  Hx of TBI, spinal cord compression, central cord syndrome, peripheral neuropathy, bilateral carpal tunnel syndrome    Examination-Activity Limitations  Dressing;Reach Overhead;Locomotion Level;Transfers    Examination-Participation Restrictions  Community Activity;Other    PT Frequency  2x / week    PT Duration  8 weeks    PT Treatment/Interventions  ADLs/Self  Care Home Management;Gait training;Neuromuscular re-education;Balance training;Therapeutic exercise;Therapeutic activities;Functional mobility training;Stair training;Patient/family education    PT Next Visit Plan  lower extremity strength (work on eccentric step downs from lower height steps-difficulty more wtih LLE being stance on upper step), hip/knee flexion in sitting/standing    Consulted and Agree with Plan of Care  Patient       Patient will benefit from skilled therapeutic intervention in order to improve the following deficits and impairments:  Abnormal gait, Decreased balance, Decreased mobility, Decreased strength, Difficulty walking, Impaired flexibility  Visit Diagnosis: Muscle weakness (generalized)  Unsteadiness on feet  Other abnormalities of gait and mobility     Problem List Patient Active Problem List   Diagnosis Date Noted  . Poor mobility 06/24/2018  . Urinary tract infection due to ESBL Klebsiella 06/08/2018  . Recurrent UTI 05/10/2018  . 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 BMI 40.0-44.9, adult ([place]) 11/25/2017  . Cervical disc disease with myelopathy 11/15/2017  . Anemia 10/20/2017  . Benign essential hypertension 10/20/2017  . Constipation 10/20/2017  . Controlled type 2 diabetes mellitus with hyperglycemia, without long-term current use of insulin ([place]) 10/20/2017  . Generalized osteoarthritis of multiple sites 10/20/2017  . Hoarseness or changing voice 10/20/2017  . 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 Upper extremity weakness 06/16/2017  . S/P cervical spinal fusion 04/29/2017  . Bilateral lower extremity edema 01/09/2017  . 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 Bilateral vocal cord paralysis 12/22/2015  . Vocal cord paralysis, unilateral complete 12/22/2015  . Oropharyngeal dysphagia 12/02/2015  . Elevated troponin level 11/19/2015  . Hypertension 11/14/2015  . Type 2 diabetes  mellitus, without long-term current use of insulin ([place]) 11/14/2015  . BPH (benign prostatic hyperplasia) 06/14/2014  . Epididymitis, right 05/17/2014  . Hematuria 05/17/2014  . Swollen testicle 05/17/2014  . Testis mass 05/17/2014  . Gross hematuria 10/19/2013  . Central cord syndrome [facility]) 10/13/2012    Silvestre Mesi 10/05/2018, 11:52 AM  [place] [address] [place], [place], [address] Phone: [phone]   Fax:  [phone]  Name: Dale Key MRN: [id number] Date of Birth: [birth year]

## 2018-10-05 NOTE — Therapy (Signed)
[facility] [address] [place], [place], [address] Phone: [phone]   Fax:  [phone]  Occupational Therapy Treatment  Patient Details  Name: Dale Key MRN: [id number] Date of Birth: [birth year] Referring Provider (OT): Dr. Ginette Pitman   Encounter Date: 10/05/2018  OT End of Session - 10/05/18 1132    Visit Number  4    Number of Visits  9    Authorization Type  UHC MCR/MCD    Authorization - Visit Number  4    Authorization - Number of Visits  10    OT Start Time  1107    OT Stop Time  1145    OT Time Calculation (min)  38 min    Activity Tolerance  Patient tolerated treatment well    Behavior During Therapy  [facility] for tasks assessed/performed       Past Medical History:  Diagnosis Date  . Diabetes mellitus without complication ([place])   . Glaucoma   . Hyperlipidemia   . Hypertension   . Tracheostomy present [facility])     Past Surgical History:  Procedure Laterality Date  . ANTERIOR CERVICAL DECOMP/DISCECTOMY FUSION N/A 10/07/2012   Procedure: ANTERIOR CERVICAL DECOMPRESSION/DISCECTOMY FUSION 2 LEVELS;  Surgeon: Floyce Stakes, MD;  Location: [place];  Service: Neurosurgery;  Laterality: N/A;  Anterior Cervical Three-Four/Four-Five Decompression and Fusion  . EYE SURGERY    . FOOT SURGERY Left    Pt states a tendon was removed to replace one in right hand.  Marland Kitchen HAND SURGERY Right   . TRACHEOSTOMY      There were no vitals filed for this visit.  Subjective Assessment - 10/05/18 1111    Subjective   denies pain    Pertinent History  h/o central cord syndrome s/p ACDF in 2014 w/ residual Lt hemiparesis, OA multiple sites, HTN, DM    Currently in Pain?  No/denies        Pt issued ER stretches - see pt instructions for details. Pt performed each Gripper set at level 3 resistance to pick up blocks Lt hand for sustained grip strength.  Pt placing small pegs in pegboard Lt hand for coordination with mod  difficulty.  UBE x 5 min for UE strength/endurance                   OT Education - 10/05/18 1130    Education Details  ER stretch HEP    Person(s) Educated  Patient    Methods  Explanation;Demonstration;Handout    Comprehension  Verbalized understanding;Returned demonstration          OT Long Term Goals - 10/03/18 1257      OT LONG TERM GOAL #1   Title  Independent with HEP for UE ROM, coordination and grip strength Lt hand    Time  4    Period  Weeks    Status  Achieved      OT LONG TERM GOAL #2   Title  Grip strength Lt hand to be 60 lbs or greater    Baseline  54 lbs    Time  4    Period  Weeks    Status  On-going      OT LONG TERM GOAL #3   Title  Improve coordination Lt hand as evidenced by performing 9 hole peg test in 35 sec or under    Baseline  40.72 sec    Time  4    Period  Weeks  Status  On-going      OT LONG TERM GOAL #4   Title  Pt to report greater ease w/ washing back of head and overhead dressing using LUE as assist    Time  4    Period  Weeks    Status  New            Plan - 10/05/18 1133    Clinical Impression Statement  Pt progressing towards goals.    Occupational performance deficits (Please refer to evaluation for details):  IADL's;ADL's;Leisure    Body Structure / Function / Physical Skills  IADL;ADL;ROM;Sensation;Body mechanics;Mobility;Strength;Coordination;UE functional use;Pain;Decreased knowledge of use of DME    Rehab Potential  Good    OT Frequency  2x / week    OT Duration  4 weeks    OT Treatment/Interventions  Self-care/ADL training;Therapeutic exercise;Functional Mobility Training;Neuromuscular education;Manual Therapy;Therapeutic activities;DME and/or AE instruction;Moist Heat;Passive range of motion;Patient/family education    Plan  continue progress towards goals - consider d/c next week or following week    Consulted and Agree with Plan of Care  Patient       Patient will benefit from skilled  therapeutic intervention in order to improve the following deficits and impairments:   Body Structure / Function / Physical Skills: IADL, ADL, ROM, Sensation, Body mechanics, Mobility, Strength, Coordination, UE functional use, Pain, Decreased knowledge of use of DME       Visit Diagnosis: Stiffness of right shoulder, not elsewhere classified  Stiffness of left shoulder, not elsewhere classified  Muscle weakness (generalized)    Problem List Patient Active Problem List   Diagnosis Date Noted  . Poor mobility 06/24/2018  . Urinary tract infection due to ESBL Klebsiella 06/08/2018  . Recurrent UTI 05/10/2018  . 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 BMI 40.0-44.9, adult ([facility]) 11/25/2017  . Cervical disc disease with myelopathy 11/15/2017  . Anemia 10/20/2017  . Benign essential hypertension 10/20/2017  . Constipation 10/20/2017  . Controlled type 2 diabetes mellitus with hyperglycemia, without long-term current use of insulin ([facility]) 10/20/2017  . Generalized osteoarthritis of multiple sites 10/20/2017  . Hoarseness or changing voice 10/20/2017  . 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 Upper extremity weakness 06/16/2017  . S/P cervical spinal fusion 04/29/2017  . Bilateral lower extremity edema 01/09/2017  . 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 Bilateral vocal cord paralysis 12/22/2015  . Vocal cord paralysis, unilateral complete 12/22/2015  . Oropharyngeal dysphagia 12/02/2015  . Elevated troponin level 11/19/2015  . Hypertension 11/14/2015  . Type 2 diabetes mellitus, without long-term current use of insulin ([facility]) 11/14/2015  . BPH (benign prostatic hyperplasia) 06/14/2014  . Epididymitis, right 05/17/2014  . Hematuria 05/17/2014  . Swollen testicle 05/17/2014  . Testis mass 05/17/2014  . Gross hematuria 10/19/2013  . Central cord syndrome [facility]) 10/13/2012    Kelli Churn, OTR/L 10/05/2018, 11:36  AM  [facility] [address] [place], [place], [address] Phone: [phone]   Fax:  [phone]  Name: Dale Key MRN: [id number] Date of Birth: 24-May-1955

## 2018-10-05 NOTE — Patient Instructions (Signed)
Scapular: Retraction in External Rotation    With hands clasped behind head, elbows up, pull elbows back, pinching shoulder blades together. Hold 10 sec.  Repeat __5__ times per set. Do __2__ sessions per day.  ROM: External Rotation - Wand (Supine)    Lie on back holding wand with elbows bent to 90. Rotate forearms over head as far as possible.  Repeat _10___ times per set. Do _2___ sessions per day.

## 2018-10-10 ENCOUNTER — Ambulatory Visit: Payer: Medicare Other | Admitting: Physical Therapy

## 2018-10-10 ENCOUNTER — Other Ambulatory Visit: Payer: Self-pay

## 2018-10-10 ENCOUNTER — Ambulatory Visit: Payer: Medicare Other | Admitting: Occupational Therapy

## 2018-10-10 DIAGNOSIS — R2689 Other abnormalities of gait and mobility: Secondary | ICD-10-CM

## 2018-10-10 DIAGNOSIS — M6281 Muscle weakness (generalized): Secondary | ICD-10-CM | POA: Diagnosis not present

## 2018-10-10 DIAGNOSIS — M25611 Stiffness of right shoulder, not elsewhere classified: Secondary | ICD-10-CM

## 2018-10-10 DIAGNOSIS — M25612 Stiffness of left shoulder, not elsewhere classified: Secondary | ICD-10-CM

## 2018-10-10 DIAGNOSIS — R278 Other lack of coordination: Secondary | ICD-10-CM

## 2018-10-10 DIAGNOSIS — R2681 Unsteadiness on feet: Secondary | ICD-10-CM

## 2018-10-10 NOTE — Therapy (Signed)
[facility] [address] [place], [place], [address] Phone: [phone]   Fax:  [phone]  Physical Therapy Treatment  Patient Details  Name: Dale Key MRN: [id number] Date of Birth: 1955-01-25 Referring Provider (PT): Tracie Harrier MD   Encounter Date: 10/10/2018  PT End of Session - 10/10/18 1044    Visit Number  13    Number of Visits  17    Date for PT Re-Evaluation  11/15/18    Authorization Type  UHC Medicare/Medicaid    PT Start Time  3785    PT Stop Time  1115    PT Time Calculation (min)  40 min    Equipment Utilized During Treatment  Gait belt    Activity Tolerance  Patient tolerated treatment well;No increased pain    Behavior During Therapy  WFL for tasks assessed/performed       Past Medical History:  Diagnosis Date  . Diabetes mellitus without complication ([place])   . Glaucoma   . Hyperlipidemia   . Hypertension   . Tracheostomy present [facility])     Past Surgical History:  Procedure Laterality Date  . ANTERIOR CERVICAL DECOMP/DISCECTOMY FUSION N/A 10/07/2012   Procedure: ANTERIOR CERVICAL DECOMPRESSION/DISCECTOMY FUSION 2 LEVELS;  Surgeon: Floyce Stakes, MD;  Location: [facility];  Service: Neurosurgery;  Laterality: N/A;  Anterior Cervical Three-Four/Four-Five Decompression and Fusion  . EYE SURGERY    . FOOT SURGERY Left    Pt states a tendon was removed to replace one in right hand.  Marland Kitchen HAND SURGERY Right   . TRACHEOSTOMY      There were no vitals filed for this visit.  Subjective Assessment - 10/10/18 1043    Subjective  Some ankle soreness but not really pain today    Pertinent History  Hx TBI with central cord syndrome in 2014, with subsequent spinal cord compression and surgery; arthritis    Patient Stated Goals  Pt's goal for therapy is to be able to touch my head with my hands, raise legs better to get into car                       [facility] Adult PT  Treatment/Exercise - 10/10/18 1056      High Level Balance   High Level Balance Comments  In bars performed walking with head turns up/down and left/right, retro walking, and tandem walking, march walking all up/down X 3 in bars with intermit UE support as needed, then balance on airex pad tandem 30 sec X 2 ea. At steps performed step taps X 15 each leg      Exercises   Exercises  Other Exercises    Other Exercises   pro stretch for ankle mobility seated 10 sec x 10 reps both feet into PF and DF. Stairs up/down X 3 reciprocal with using handrails  then stap taps for balance X 15 each side      Knee/Hip Exercises: Aerobic   Other Aerobic  Scifit UE/LE on level 3.5 for 6 minutes with goal >/=50 for strengthening/activity tolerance.       Knee/Hip Exercises: Standing   Step Down Limitations  eccentric step down from 4 inch step X 10 each side               PT Short Term Goals - 09/23/18 1323      PT SHORT TERM GOAL #1   Title  Pt will be independent with HEP for improved strength, balance,  gait.  TARGET 09/16/2018    Baseline  09/14/18- met with current program    Status  Achieved      PT SHORT TERM GOAL #2   Title  Pt will improve 5x sit<>stand score to less than or equal to 13 seconds, with UE support, for improved transfer efficiency and lower extremity strength.    Baseline  12.69 09/14/2018    Status  Achieved      PT SHORT TERM GOAL #3   Title  Pt will improve TUG score to less than or equal to 15 seconds for decreased fall risk.    Baseline  09/23/18: 11.62 ft/sec with cane    Status  Achieved    Target Date  --      PT SHORT TERM GOAL #4   Title  Pt will negotiatie at least 4 steps, step through pattern, modified independently for improved safe stair negotiation.    Baseline  09/23/18: Mod I to ascend wtih rail, supervision to descend    Time  --    Period  --    Status  Partially Met    Target Date  --      PT SHORT TERM GOAL #5   Title  Pt will verbalize  understanding of fall prevention in home environment.    Baseline  09/23/18: met in session today    Time  --    Period  --    Status  Achieved    Target Date  --        PT Long Term Goals - 08/17/18 1214      PT LONG TERM GOAL #1   Title  Pt will be independent with progression of HEP for improved strength, balance, gait.  TARGET 10/14/2018    Time  8    Period  Weeks    Status  New    Target Date  10/14/18      PT LONG TERM GOAL #2   Title  Pt will report improved ease of transfers from <18" surfaces by at least 50%, demonstrating improved lower extremity strength.    Time  8    Period  Weeks    Status  New    Target Date  10/14/18      PT LONG TERM GOAL #3   Title  Pt will improve TUG score to less than or equal to 13.5 sec for decreased fall risk.    Time  8    Period  Weeks    Status  New    Target Date  10/14/18      PT LONG TERM GOAL #4   Title  Pt will improve DGI score to at least 19/24 for decreased fall risk.    Time  8    Period  Weeks    Status  New    Target Date  10/14/18      PT LONG TERM GOAL #5   Title  Pt will improve gait velocity to at least 2.62 ft/sec for improved gait efficiency and safety in the community.    Time  8    Period  Weeks    Status  New    Target Date  10/14/18            Plan - 10/10/18 1110    Clinical Impression Statement  Session focused on balance and leg strength with good tolerance and he showed improved stability today with balance training. PT will continue to progress toward his functional  goals.    Comorbidities  Hx of TBI, spinal cord compression, central cord syndrome, peripheral neuropathy, bilateral carpal tunnel syndrome    Examination-Activity Limitations  Dressing;Reach Overhead;Locomotion Level;Transfers    Examination-Participation Restrictions  Community Activity;Other    PT Frequency  2x / week    PT Duration  8 weeks    PT Treatment/Interventions  ADLs/Self Care Home Management;Gait  training;Neuromuscular re-education;Balance training;Therapeutic exercise;Therapeutic activities;Functional mobility training;Stair training;Patient/family education    PT Next Visit Plan  lower extremity strength (work on eccentric step downs from lower height steps-difficulty more wtih LLE being stance on upper step), hip/knee flexion in sitting/standing    Consulted and Agree with Plan of Care  Patient       Patient will benefit from skilled therapeutic intervention in order to improve the following deficits and impairments:  Abnormal gait, Decreased balance, Decreased mobility, Decreased strength, Difficulty walking, Impaired flexibility  Visit Diagnosis: Muscle weakness (generalized)  Unsteadiness on feet  Other abnormalities of gait and mobility     Problem List Patient Active Problem List   Diagnosis Date Noted  . Poor mobility 06/24/2018  . Urinary tract infection due to ESBL Klebsiella 06/08/2018  . Recurrent UTI 05/10/2018  . 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 BMI 40.0-44.9, adult ([place]) 11/25/2017  . Cervical disc disease with myelopathy 11/15/2017  . Anemia 10/20/2017  . Benign essential hypertension 10/20/2017  . Constipation 10/20/2017  . Controlled type 2 diabetes mellitus with hyperglycemia, without long-term current use of insulin ([place]) 10/20/2017  . Generalized osteoarthritis of multiple sites 10/20/2017  . Hoarseness or changing voice 10/20/2017  . 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 Upper extremity weakness 06/16/2017  . S/P cervical spinal fusion 04/29/2017  . Bilateral lower extremity edema 01/09/2017  . 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 Bilateral vocal cord paralysis 12/22/2015  . Vocal cord paralysis, unilateral complete 12/22/2015  . Oropharyngeal dysphagia 12/02/2015  . Elevated troponin level 11/19/2015  . Hypertension 11/14/2015  . Type 2 diabetes mellitus, without long-term  current use of insulin ([place]) 11/14/2015  . BPH (benign prostatic hyperplasia) 06/14/2014  . Epididymitis, right 05/17/2014  . Hematuria 05/17/2014  . Swollen testicle 05/17/2014  . Testis mass 05/17/2014  . Gross hematuria 10/19/2013  . Central cord syndrome [facility]) 10/13/2012    Debbe Odea, PT,DPT 10/10/2018, 11:12 AM  [facility] [address] [place], [place], [address] Phone: [phone]   Fax:  [phone]  Name: Dale Key MRN: [id number] Date of Birth: 09-24-1955

## 2018-10-10 NOTE — Therapy (Signed)
[facility] [address] [place], [place], [address] Phone: [phone]   Fax:  [phone]  Occupational Therapy Treatment  Patient Details  Name: Dale Key MRN: [id number] Date of Birth: 30-Jan-1955 Referring Provider (OT): Dr. Marcello Fennel   Encounter Date: 10/10/2018  OT End of Session - 10/10/18 1013    Visit Number  5    Number of Visits  9    Authorization Type  UHC MCR/MCD    Authorization - Visit Number  5    Authorization - Number of Visits  10    OT Start Time  0945   Pt 15 min late   OT Stop Time  1015    OT Time Calculation (min)  30 min    Activity Tolerance  Patient tolerated treatment well    Behavior During Therapy  [facility] for tasks assessed/performed       Past Medical History:  Diagnosis Date  . Diabetes mellitus without complication ([facility])   . Glaucoma   . Hyperlipidemia   . Hypertension   . Tracheostomy present [facility])     Past Surgical History:  Procedure Laterality Date  . ANTERIOR CERVICAL DECOMP/DISCECTOMY FUSION N/A 10/07/2012   Procedure: ANTERIOR CERVICAL DECOMPRESSION/DISCECTOMY FUSION 2 LEVELS;  Surgeon: Karn Cassis, MD;  Location: [facility] OR;  Service: Neurosurgery;  Laterality: N/A;  Anterior Cervical Three-Four/Four-Five Decompression and Fusion  . EYE SURGERY    . FOOT SURGERY Left    Pt states a tendon was removed to replace one in right hand.  Marland Kitchen HAND SURGERY Right   . TRACHEOSTOMY      There were no vitals filed for this visit.  Subjective Assessment - 10/10/18 0950    Pertinent History  h/o central cord syndrome s/p ACDF in 2014 w/ residual Lt hemiparesis, OA multiple sites, HTN, DM    Currently in Pain?  Yes   Rt ankle - O.T. not addressing                  OT Treatments/Exercises (OP) - 10/10/18 0001      Exercises   Exercises  Shoulder;Hand      Shoulder Exercises: ROM/Strengthening   UBE (Upper Arm Bike)  UBE x 5 min, Level 3 resistance for  strength/endurance BUE's      Hand Exercises   Other Hand Exercises  Gripper set at level 3 resistance to pick up blocks Lt hand for sustained grip strength      Functional Reaching Activities   Mid Level  MId to high level reaching to place clothespins on antenna (red to black resistance) for pinch strength and ROM LUE                  OT Long Term Goals - 10/10/18 1014      OT LONG TERM GOAL #1   Title  Independent with HEP for UE ROM, coordination and grip strength Lt hand    Time  4    Period  Weeks    Status  Achieved      OT LONG TERM GOAL #2   Title  Grip strength Lt hand to be 60 lbs or greater    Baseline  54 lbs    Time  4    Period  Weeks    Status  On-going      OT LONG TERM GOAL #3   Title  Improve coordination Lt hand as evidenced by performing 9 hole peg test in 35 sec or  under    Baseline  40.72 sec    Time  4    Period  Weeks    Status  On-going      OT LONG TERM GOAL #4   Title  Pt to report greater ease w/ washing back of head and overhead dressing using LUE as assist    Time  4    Period  Weeks    Status  On-going            Plan - 10/10/18 1014    Clinical Impression Statement  Pt progressing towards LTG's. Pt has decreased endurance/deconditioning    Occupational performance deficits (Please refer to evaluation for details):  IADL's;ADL's;Leisure    Body Structure / Function / Physical Skills  IADL;ADL;ROM;Sensation;Body mechanics;Mobility;Strength;Coordination;UE functional use;Pain;Decreased knowledge of use of DME    Rehab Potential  Good    OT Frequency  2x / week    OT Duration  4 weeks    OT Treatment/Interventions  Self-care/ADL training;Therapeutic exercise;Functional Mobility Training;Neuromuscular education;Manual Therapy;Therapeutic activities;DME and/or AE instruction;Moist Heat;Passive range of motion;Patient/family education    Plan  continue progress towards goals    Consulted and Agree with Plan of Care  Patient        Patient will benefit from skilled therapeutic intervention in order to improve the following deficits and impairments:   Body Structure / Function / Physical Skills: IADL, ADL, ROM, Sensation, Body mechanics, Mobility, Strength, Coordination, UE functional use, Pain, Decreased knowledge of use of DME       Visit Diagnosis: Stiffness of right shoulder, not elsewhere classified  Stiffness of left shoulder, not elsewhere classified  Muscle weakness (generalized)  Other lack of coordination    Problem List Patient Active Problem List   Diagnosis Date Noted  . Poor mobility 06/24/2018  . Urinary tract infection due to ESBL Klebsiella 06/08/2018  . Recurrent UTI 05/10/2018  . 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 BMI 40.0-44.9, adult ([place]) 11/25/2017  . Cervical disc disease with myelopathy 11/15/2017  . Anemia 10/20/2017  . Benign essential hypertension 10/20/2017  . Constipation 10/20/2017  . Controlled type 2 diabetes mellitus with hyperglycemia, without long-term current use of insulin ([place]) 10/20/2017  . Generalized osteoarthritis of multiple sites 10/20/2017  . Hoarseness or changing voice 10/20/2017  . 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 Upper extremity weakness 06/16/2017  . S/P cervical spinal fusion 04/29/2017  . Bilateral lower extremity edema 01/09/2017  . Tracheostomy dependence ([place]) 05/26/2016  . 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 Bilateral vocal cord paralysis 12/22/2015  . Vocal cord paralysis, unilateral complete 12/22/2015  . Oropharyngeal dysphagia 12/02/2015  . Elevated troponin level 11/19/2015  . Hypertension 11/14/2015  . Type 2 diabetes mellitus, without long-term current use of insulin ([place]) 11/14/2015  . BPH (benign prostatic hyperplasia) 06/14/2014  . Epididymitis, right 05/17/2014  . Hematuria 05/17/2014  . Swollen testicle 05/17/2014  . Testis mass 05/17/2014  . Gross hematuria 10/19/2013  . Central cord syndrome  [facility]) 10/13/2012    Carey Bullocks, OTR/L 10/10/2018, 10:16 AM  [facility] [address] [place], [place], [address] Phone: [phone]   Fax:  [phone]  Name: Dale Key MRN: [id number] Date of Birth: 06/11/1955

## 2018-10-12 ENCOUNTER — Ambulatory Visit: Payer: Medicare Other | Admitting: Occupational Therapy

## 2018-10-12 ENCOUNTER — Ambulatory Visit: Payer: Medicare Other | Admitting: Physical Therapy

## 2018-10-17 ENCOUNTER — Other Ambulatory Visit: Payer: Self-pay

## 2018-10-17 ENCOUNTER — Encounter: Payer: Self-pay | Admitting: Physical Therapy

## 2018-10-17 ENCOUNTER — Ambulatory Visit: Payer: Medicare Other | Attending: Internal Medicine | Admitting: Physical Therapy

## 2018-10-17 ENCOUNTER — Ambulatory Visit: Payer: Medicare Other | Admitting: Occupational Therapy

## 2018-10-17 DIAGNOSIS — M6281 Muscle weakness (generalized): Secondary | ICD-10-CM | POA: Insufficient documentation

## 2018-10-17 DIAGNOSIS — R278 Other lack of coordination: Secondary | ICD-10-CM | POA: Insufficient documentation

## 2018-10-17 DIAGNOSIS — M25612 Stiffness of left shoulder, not elsewhere classified: Secondary | ICD-10-CM

## 2018-10-17 DIAGNOSIS — R2681 Unsteadiness on feet: Secondary | ICD-10-CM | POA: Insufficient documentation

## 2018-10-17 DIAGNOSIS — R2689 Other abnormalities of gait and mobility: Secondary | ICD-10-CM | POA: Diagnosis present

## 2018-10-17 NOTE — Therapy (Signed)
[facility] [address] [place], [place], [address] Phone: [phone]   Fax:  [phone]  Occupational Therapy Treatment  Patient Details  Name: Dale Key MRN: [id number] Date of Birth: 1955/10/17 Referring Provider (OT): Dr. Marcello Fennel   Encounter Date: 10/17/2018  OT End of Session - 10/17/18 1123    Visit Number  6    Number of Visits  9    Authorization Type  UHC MCR/MCD    Authorization - Visit Number  6    Authorization - Number of Visits  10    OT Start Time  1100    OT Stop Time  1140    OT Time Calculation (min)  40 min    Activity Tolerance  Patient tolerated treatment well       Past Medical History:  Diagnosis Date  . Diabetes mellitus without complication ([facility])   . Glaucoma   . Hyperlipidemia   . Hypertension   . Tracheostomy present [facility])     Past Surgical History:  Procedure Laterality Date  . ANTERIOR CERVICAL DECOMP/DISCECTOMY FUSION N/A 10/07/2012   Procedure: ANTERIOR CERVICAL DECOMPRESSION/DISCECTOMY FUSION 2 LEVELS;  Surgeon: Karn Cassis, MD;  Location: [facility] OR;  Service: Neurosurgery;  Laterality: N/A;  Anterior Cervical Three-Four/Four-Five Decompression and Fusion  . EYE SURGERY    . FOOT SURGERY Left    Pt states a tendon was removed to replace one in right hand.  Marland Kitchen HAND SURGERY Right   . TRACHEOSTOMY      There were no vitals filed for this visit.  Subjective Assessment - 10/17/18 1104    Subjective   My arm is a little better    Pertinent History  h/o central cord syndrome s/p ACDF in 2014 w/ residual Lt hemiparesis, OA multiple sites, HTN, DM    Currently in Pain?  No/denies       Assessed goals and progress to date (see goal section).  Reprinted HEP's from O.T. and added to pt's Ex folder.  Also discussed functional ways to incorporate coordination into activities including playing guitar. Pt reports fingers, especially at DIP joints are stiff and recommended soaking  hand in warm water and stretching prior to playing guitar.  UBE x 8 min. Level 5 for BUE strength/endurance. Pt placing small pegs in pegboard and removing Lt hand for [facility]                         OT Long Term Goals - 10/17/18 1120      OT LONG TERM GOAL #1   Title  Independent with HEP for UE ROM, coordination and grip strength Lt hand    Time  4    Period  Weeks    Status  Achieved      OT LONG TERM GOAL #2   Title  Grip strength Lt hand to be 60 lbs or greater    Baseline  54 lbs    Time  4    Period  Weeks    Status  Achieved   64 lbs     OT LONG TERM GOAL #3   Title  Improve coordination Lt hand as evidenced by performing 9 hole peg test in 35 sec or under    Baseline  40.72 sec    Time  4    Period  Weeks    Status  On-going   37.06 sec     OT LONG TERM GOAL #4  Title  Pt to report greater ease w/ washing back of head and overhead dressing using LUE as assist    Time  4    Period  Weeks    Status  On-going            Plan - 10/17/18 1121    Clinical Impression Statement  Pt with improved grip strength Lt hand, and slightly improved coordination. Pt also reports greater ease using Lt hand to assist washing head/hair and with overhead dressing    Occupational performance deficits (Please refer to evaluation for details):  IADL's;ADL's;Leisure    Body Structure / Function / Physical Skills  IADL;ADL;ROM;Sensation;Body mechanics;Mobility;Strength;Coordination;UE functional use;Pain;Decreased knowledge of use of DME    Rehab Potential  Good    OT Frequency  2x / week    OT Duration  4 weeks    OT Treatment/Interventions  Self-care/ADL training;Therapeutic exercise;Functional Mobility Training;Neuromuscular education;Manual Therapy;Therapeutic activities;DME and/or AE instruction;Moist Heat;Passive range of motion;Patient/family education    Plan  assess remaining goals and d/c next session    OT Home Exercise Plan  HEP provided:  green putty,  cane HEP for shoulder ROM, coordination HEP    Consulted and Agree with Plan of Care  Patient       Patient will benefit from skilled therapeutic intervention in order to improve the following deficits and impairments:   Body Structure / Function / Physical Skills: IADL, ADL, ROM, Sensation, Body mechanics, Mobility, Strength, Coordination, UE functional use, Pain, Decreased knowledge of use of DME       Visit Diagnosis: Stiffness of left shoulder, not elsewhere classified  Muscle weakness (generalized)  Other lack of coordination    Problem List Patient Active Problem List   Diagnosis Date Noted  . Poor mobility 06/24/2018  . Urinary tract infection due to ESBL Klebsiella 06/08/2018  . Recurrent UTI 05/10/2018  . 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 BMI 40.0-44.9, adult ([place]) 11/25/2017  . Cervical disc disease with myelopathy 11/15/2017  . Anemia 10/20/2017  . Benign essential hypertension 10/20/2017  . Constipation 10/20/2017  . Controlled type 2 diabetes mellitus with hyperglycemia, without long-term current use of insulin ([place]) 10/20/2017  . Generalized osteoarthritis of multiple sites 10/20/2017  . Hoarseness or changing voice 10/20/2017  . 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 Upper extremity weakness 06/16/2017  . S/P cervical spinal fusion 04/29/2017  . Bilateral lower extremity edema 01/09/2017  . 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 Bilateral vocal cord paralysis 12/22/2015  . Vocal cord paralysis, unilateral complete 12/22/2015  . Oropharyngeal dysphagia 12/02/2015  . Elevated troponin level 11/19/2015  . Hypertension 11/14/2015  . Type 2 diabetes mellitus, without long-term current use of insulin ([place]) 11/14/2015  . BPH (benign prostatic hyperplasia) 06/14/2014  . Epididymitis, right 05/17/2014  . Hematuria 05/17/2014  . Swollen testicle 05/17/2014  . Testis mass 05/17/2014  . Gross  hematuria 10/19/2013  . Central cord syndrome [facility]) 10/13/2012    Carey Bullocks, OTR/L 10/17/2018, 11:39 AM  [facility] [address] [place], [place], [address] Phone: [phone]   Fax:  [phone]  Name: Dale Key MRN: [id number] Date of Birth: 03/20/1955

## 2018-10-17 NOTE — Patient Instructions (Addendum)
Access Code: [email or web address]  URL: [email or web address]  Date: 10/17/2018  Prepared by: Mady Haagensen   Exercises Supine Bridge - 10 reps - 1-2 sets - 5 hold - 2x daily - 6x weekly Sit to Stand - 10 reps - 2-3 sets - 2x daily - 6x weekly Step Up - 10 reps - 2 sets - 2x daily - 6x weekly Heel Toe Raises with Unilateral Counter Support - 10 reps - 3 sets                            - 2x daily - 6x weekly Standing Tandem Balance with Unilateral Counter Support - 3 sets - 30 hold - 2x daily - 6x weekly Standing Single Leg Stance with Unilateral Counter Support - 3 reps - 1 sets - 30 hold - 2x daily - 6x weekly Stride Stance Weight Shift - 10 reps - 2 sets - 1x daily - 6x weekly Standing Gastroc Stretch - 3 reps - 1 sets - 30 sec hold - 1-2x daily - 6x weekly Standing Gastroc Stretch on Step - 3 reps - 1 sets - 30 sec hold - 1-2x daily - 6x weekly Seated Hamstring Stretch - 3 sets - 30 hold - 2x daily - 6x weekly  Added 10/17/2018 Lunge with Counter Support - 10 reps - 1-2 sets - 1x daily - 5x weekly Wall Squat - 10 reps - 1-2 sets - 1x daily - 5x weekly Mini Squat - 10 reps - 1-2 sets - 1x daily - 5x weekly

## 2018-10-17 NOTE — Therapy (Signed)
[facility] [address] [place], [place], [address] Phone: [phone]   Fax:  [phone]  Physical Therapy Treatment  Patient Details  Name: Dale Key MRN: [id number] Date of Birth: April 04, 1955 Referring Provider (PT): Tracie Harrier MD   Encounter Date: 10/17/2018  PT End of Session - 10/17/18 2105    Visit Number  14    Number of Visits  17    Date for PT Re-Evaluation  11/15/18    Authorization Type  UHC Medicare/Medicaid    PT Start Time  1020    PT Stop Time  1058    PT Time Calculation (min)  38 min    Activity Tolerance  Patient tolerated treatment well;No increased pain    Behavior During Therapy  WFL for tasks assessed/performed       Past Medical History:  Diagnosis Date  . Diabetes mellitus without complication ([place])   . Glaucoma   . Hyperlipidemia   . Hypertension   . Tracheostomy present [facility])     Past Surgical History:  Procedure Laterality Date  . ANTERIOR CERVICAL DECOMP/DISCECTOMY FUSION N/A 10/07/2012   Procedure: ANTERIOR CERVICAL DECOMPRESSION/DISCECTOMY FUSION 2 LEVELS;  Surgeon: Floyce Stakes, MD;  Location: [facility];  Service: Neurosurgery;  Laterality: N/A;  Anterior Cervical Three-Four/Four-Five Decompression and Fusion  . EYE SURGERY    . FOOT SURGERY Left    Pt states a tendon was removed to replace one in right hand.  Marland Kitchen HAND SURGERY Right   . TRACHEOSTOMY      There were no vitals filed for this visit.  Subjective Assessment - 10/17/18 1023    Subjective  Couldn't really walk on Friday, due to the ankle swelling and pain (did not take my water pills because he had errands to run).  Ankle doing okay today.    Pertinent History  Hx TBI with central cord syndrome in 2014, with subsequent spinal cord compression and surgery; arthritis    Patient Stated Goals  Pt's goal for therapy is to be able to touch my head with my hands, raise legs better to get into car    Currently  in Pain?  No/denies                       [facility] Adult PT Treatment/Exercise - 10/17/18 1031      Ambulation/Gait   Ambulation/Gait  Yes    Ambulation/Gait Assistance  5: Supervision    Ambulation Distance (Feet)  200 Feet    Assistive device  Straight cane    Gait Pattern  Step-through pattern;Decreased dorsiflexion - right;Decreased dorsiflexion - left;Lateral hip instability;Wide base of support;Poor foot clearance - left;Poor foot clearance - right    Ambulation Surface  Level;Indoor    Gait velocity  13.65 sec = 2.4 ft/sec    Stairs  Yes    Stairs Assistance  6: Modified independent (Device/Increase time)    Stairs Assistance Details (indicate cue type and reason)  Pt with decreased eccentric control descending last 1-2 steps    Stair Management Technique  Two rails;Alternating pattern;Forwards    Number of Stairs  4   x 3 reps   Height of Stairs  6      Standardized Balance Assessment   Standardized Balance Assessment  Dynamic Gait Index      Dynamic Gait Index   Level Surface  Mild Impairment    Change in Gait Speed  Mild Impairment    Gait with Horizontal  Head Turns  Mild Impairment    Gait with Vertical Head Turns  Mild Impairment    Gait and Pivot Turn  Mild Impairment    Step Over Obstacle  Moderate Impairment    Step Around Obstacles  Mild Impairment    Steps  Mild Impairment    Total Score  15      Timed Up and Go Test   TUG  Normal TUG    Normal TUG (seconds)  12.75      Knee/Hip Exercises: Standing   Forward Lunges  Right;Left;1 set;10 reps    Forward Lunges Limitations  for lower extremity strengthening to work on eccentric control descending stairs    Functional Squat  1 set;10 reps    Functional Squat Limitations  Cues for proper technique    Wall Squat  1 set;10 reps             PT Education - 10/17/18 2105    Education Details  Additions to HEP; organized pt's exercises in folder:  stretching, strengthening, balance; discussed  plans for d/c next visit    Person(s) Educated  Patient    Methods  Explanation;Demonstration;Handout    Comprehension  Verbalized understanding;Returned demonstration       PT Short Term Goals - 09/23/18 1323      PT SHORT TERM GOAL #1   Title  Pt will be independent with HEP for improved strength, balance, gait.  TARGET 09/16/2018    Baseline  09/14/18- met with current program    Status  Achieved      PT SHORT TERM GOAL #2   Title  Pt will improve 5x sit<>stand score to less than or equal to 13 seconds, with UE support, for improved transfer efficiency and lower extremity strength.    Baseline  12.69 09/14/2018    Status  Achieved      PT SHORT TERM GOAL #3   Title  Pt will improve TUG score to less than or equal to 15 seconds for decreased fall risk.    Baseline  09/23/18: 11.62 ft/sec with cane    Status  Achieved    Target Date  --      PT SHORT TERM GOAL #4   Title  Pt will negotiatie at least 4 steps, step through pattern, modified independently for improved safe stair negotiation.    Baseline  09/23/18: Mod I to ascend wtih rail, supervision to descend    Time  --    Period  --    Status  Partially Met    Target Date  --      PT SHORT TERM GOAL #5   Title  Pt will verbalize understanding of fall prevention in home environment.    Baseline  09/23/18: met in session today    Time  --    Period  --    Status  Achieved    Target Date  --        PT Long Term Goals - 10/17/18 1028      PT LONG TERM GOAL #1   Title  Pt will be independent with progression of HEP for improved strength, balance, gait.  TARGET 10/14/2018    Time  8    Period  Weeks    Status  New      PT LONG TERM GOAL #2   Title  Pt will report improved ease of transfers from <18" surfaces by at least 50%, demonstrating improved lower extremity strength.  Time  8    Period  Weeks    Status  New      PT LONG TERM GOAL #3   Title  Pt will improve TUG score to less than or equal to 13.5 sec for  decreased fall risk.    Baseline  12.75 sec 10/17/2018    Time  8    Period  Weeks    Status  Achieved      PT LONG TERM GOAL #4   Title  Pt will improve DGI score to at least 19/24 for decreased fall risk.    Baseline  16/24 10/17/2018-improved from 13/24    Time  8    Period  Weeks    Status  Partially Met      PT LONG TERM GOAL #5   Title  Pt will improve gait velocity to at least 2.62 ft/sec for improved gait efficiency and safety in the community.    Baseline  2.4 ft/sec    Time  8    Period  Weeks    Status  Not Met            Plan - 10/17/18 2106    Clinical Impression Statement  Began assessing LTGs this visit, with pt meeting LTG 3.  LTG 4 partially met for improved DGI score to 16/24 (improved, but not to goal level).  LTG 5 not met for gait velocity, as gait velocity is 2.4 ft/sec.  Provided patient with additional strengthening exercises to HEP.  Anticipate d/c next visit.    Comorbidities  Hx of TBI, spinal cord compression, central cord syndrome, peripheral neuropathy, bilateral carpal tunnel syndrome    Examination-Activity Limitations  Dressing;Reach Overhead;Locomotion Level;Transfers    Examination-Participation Restrictions  Community Activity;Other    PT Frequency  2x / week    PT Duration  8 weeks    PT Treatment/Interventions  ADLs/Self Care Home Management;Gait training;Neuromuscular re-education;Balance training;Therapeutic exercise;Therapeutic activities;Functional mobility training;Stair training;Patient/family education    PT Next Visit Plan  Check remaining goals, check HEP, including new HEP; discharge next visit.  Discuss options for return to community fitness    Consulted and Agree with Plan of Care  Patient       Patient will benefit from skilled therapeutic intervention in order to improve the following deficits and impairments:  Abnormal gait, Decreased balance, Decreased mobility, Decreased strength, Difficulty walking, Impaired  flexibility  Visit Diagnosis: Unsteadiness on feet  Other abnormalities of gait and mobility  Muscle weakness (generalized)     Problem List Patient Active Problem List   Diagnosis Date Noted  . Poor mobility 06/24/2018  . Urinary tract infection due to ESBL Klebsiella 06/08/2018  . Recurrent UTI 05/10/2018  . 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 BMI 40.0-44.9, adult ([place]) 11/25/2017  . Cervical disc disease with myelopathy 11/15/2017  . Anemia 10/20/2017  . Benign essential hypertension 10/20/2017  . Constipation 10/20/2017  . Controlled type 2 diabetes mellitus with hyperglycemia, without long-term current use of insulin ([place]) 10/20/2017  . Generalized osteoarthritis of multiple sites 10/20/2017  . Hoarseness or changing voice 10/20/2017  . 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 Upper extremity weakness 06/16/2017  . S/P cervical spinal fusion 04/29/2017  . Bilateral lower extremity edema 01/09/2017  . 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 Bilateral vocal cord paralysis 12/22/2015  . Vocal cord paralysis, unilateral complete 12/22/2015  . Oropharyngeal dysphagia 12/02/2015  . Elevated troponin level 11/19/2015  .  Hypertension 11/14/2015  . Type 2 diabetes mellitus, without long-term current use of insulin ([place]) 11/14/2015  . BPH (benign prostatic hyperplasia) 06/14/2014  . Epididymitis, right 05/17/2014  . Hematuria 05/17/2014  . Swollen testicle 05/17/2014  . Testis mass 05/17/2014  . Gross hematuria 10/19/2013  . Central cord syndrome [facility]) 10/13/2012    Ramar Nobrega W. 10/17/2018, 9:09 PM  Frazier Butt., PT   [facility] [address] [place], [place], [address] Phone: [phone]   Fax:  [phone]  Name: Javen Hinderliter MRN: [id number] Date of Birth: 07-05-1955

## 2018-10-19 ENCOUNTER — Encounter: Payer: Self-pay | Admitting: Physical Therapy

## 2018-10-19 ENCOUNTER — Ambulatory Visit: Payer: Medicare Other | Admitting: Occupational Therapy

## 2018-10-19 ENCOUNTER — Ambulatory Visit: Payer: Medicare Other | Admitting: Physical Therapy

## 2018-10-19 ENCOUNTER — Other Ambulatory Visit: Payer: Self-pay

## 2018-10-19 DIAGNOSIS — R2689 Other abnormalities of gait and mobility: Secondary | ICD-10-CM

## 2018-10-19 DIAGNOSIS — M6281 Muscle weakness (generalized): Secondary | ICD-10-CM

## 2018-10-19 DIAGNOSIS — R2681 Unsteadiness on feet: Secondary | ICD-10-CM

## 2018-10-19 DIAGNOSIS — R278 Other lack of coordination: Secondary | ICD-10-CM

## 2018-10-19 DIAGNOSIS — M25612 Stiffness of left shoulder, not elsewhere classified: Secondary | ICD-10-CM

## 2018-10-19 NOTE — Patient Instructions (Signed)
Access Code: [email or web address]  URL: [email or web address]  Date: 10/19/2018  Prepared by: Willow Ora   Exercises Supine Bridge - 10 reps - 1-2 sets - 5 hold - 2x daily - 6x weekly Sit to Stand - 10 reps - 2-3 sets - 2x daily - 6x weekly Step Up - 10 reps - 2 sets - 2x daily - 6x weekly Heel Toe Raises with Unilateral Counter Support - 10 reps - 3 sets                            - 2x daily - 6x weekly Standing Single Leg Stance with Unilateral Counter Support - 3 reps - 1 sets - 30 hold - 2x daily - 6x weekly Standing Gastroc Stretch - 3 reps - 1 sets - 30 sec hold - 1-2x daily - 6x weekly Standing Gastroc Stretch on Step - 3 reps - 1 sets - 30 sec hold - 1-2x daily - 6x weekly Seated Hamstring Stretch - 3 sets - 30 hold - 2x daily - 6x weekly Lunge with Counter Support - 10 reps - 1-2 sets - 1x daily - 5x weekly Wall Squat - 10 reps - 1-2 sets - 1x daily - 5x weekly Tandem Walking with Counter Support - 4 reps - 1 sets - 1x daily - 5x weekly Mini Squat - 10 reps - 1-2 sets - 1x daily - 5x weekly

## 2018-10-19 NOTE — Therapy (Signed)
[facility] [address] [place], [place], [address] Phone: [phone]   Fax:  [phone]  Occupational Therapy Treatment  Patient Details  Name: Dale Key MRN: [id number] Date of Birth: 1955-12-05 Referring Provider (OT): Dr. Ginette Pitman   Encounter Date: 10/19/2018  OT End of Session - 10/19/18 1120    Visit Number  7    Number of Visits  9    Authorization Type  UHC MCR/MCD    Authorization - Visit Number  7    Authorization - Number of Visits  10    OT Start Time  0355    OT Stop Time  1143    OT Time Calculation (min)  38 min    Activity Tolerance  Patient tolerated treatment well    Behavior During Therapy  [facility] for tasks assessed/performed       Past Medical History:  Diagnosis Date  . Diabetes mellitus without complication ([place])   . Glaucoma   . Hyperlipidemia   . Hypertension   . Tracheostomy present [facility])     Past Surgical History:  Procedure Laterality Date  . ANTERIOR CERVICAL DECOMP/DISCECTOMY FUSION N/A 10/07/2012   Procedure: ANTERIOR CERVICAL DECOMPRESSION/DISCECTOMY FUSION 2 LEVELS;  Surgeon: Floyce Stakes, MD;  Location: [facility];  Service: Neurosurgery;  Laterality: N/A;  Anterior Cervical Three-Four/Four-Five Decompression and Fusion  . EYE SURGERY    . FOOT SURGERY Left    Pt states a tendon was removed to replace one in right hand.  Marland Kitchen HAND SURGERY Right   . TRACHEOSTOMY      There were no vitals filed for this visit.  Subjective Assessment - 10/19/18 1115    Pertinent History  h/o central cord syndrome s/p ACDF in 2014 w/ residual Lt hemiparesis, OA multiple sites, HTN, DM    Currently in Pain?  No/denies   in UE's        [facility] OT Assessment - 10/19/18 0001      Coordination   Left 9 Hole Peg Test  29.15 sec               OT Treatments/Exercises (OP) - 10/19/18 1123      ADLs   ADL Comments  Assessed remaining goals       Hand Exercises   Other Hand Exercises   Pt performed coordination activities including: translating stress balls, manipulating highlighter between first 3 fingers, and placing small pegs in pegboard Lt hand (including small regular pegs and also working key shaped pegs for more challenge)       UBE x 5 min. Level 5 for UE strength/endurance            OT Long Term Goals - 10/19/18 1121      OT LONG TERM GOAL #1   Title  Independent with HEP for UE ROM, coordination and grip strength Lt hand    Time  4    Period  Weeks    Status  Achieved      OT LONG TERM GOAL #2   Title  Grip strength Lt hand to be 60 lbs or greater    Baseline  54 lbs    Time  4    Period  Weeks    Status  Achieved   64 lbs     OT LONG TERM GOAL #3   Title  Improve coordination Lt hand as evidenced by performing 9 hole peg test in 35 sec or under    Baseline  40.72 sec    Time  4    Period  Weeks    Status  Achieved   29.15 sec.     OT LONG TERM GOAL #4   Title  Pt to report greater ease w/ washing back of head and overhead dressing using LUE as assist    Time  4    Period  Weeks    Status  Achieved            Plan - 10/19/18 1136    Clinical Impression Statement  Pt has met all LTG's at this time. Pt with improved LUE function    Occupational performance deficits (Please refer to evaluation for details):  IADL's;ADL's;Leisure    Body Structure / Function / Physical Skills  IADL;ADL;ROM;Sensation;Body mechanics;Mobility;Strength;Coordination;UE functional use;Pain;Decreased knowledge of use of DME    Rehab Potential  Good    OT Frequency  2x / week    OT Duration  4 weeks    OT Treatment/Interventions  Self-care/ADL training;Therapeutic exercise;Functional Mobility Training;Neuromuscular education;Manual Therapy;Therapeutic activities;DME and/or AE instruction;Moist Heat;Passive range of motion;Patient/family education    Plan  D/C O.T.    Consulted and Agree with Plan of Care  Patient       Patient will benefit from  skilled therapeutic intervention in order to improve the following deficits and impairments:   Body Structure / Function / Physical Skills: IADL, ADL, ROM, Sensation, Body mechanics, Mobility, Strength, Coordination, UE functional use, Pain, Decreased knowledge of use of DME       Visit Diagnosis: Muscle weakness (generalized)  Other lack of coordination  Stiffness of left shoulder, not elsewhere classified    Problem List Patient Active Problem List   Diagnosis Date Noted  . Poor mobility 06/24/2018  . Urinary tract infection due to ESBL Klebsiella 06/08/2018  . Recurrent UTI 05/10/2018  . 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 BMI 40.0-44.9, adult ([place]) 11/25/2017  . Cervical disc disease with myelopathy 11/15/2017  . Anemia 10/20/2017  . Benign essential hypertension 10/20/2017  . Constipation 10/20/2017  . Controlled type 2 diabetes mellitus with hyperglycemia, without long-term current use of insulin ([place]) 10/20/2017  . Generalized osteoarthritis of multiple sites 10/20/2017  . Hoarseness or changing voice 10/20/2017  . 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 Upper extremity weakness 06/16/2017  . S/P cervical spinal fusion 04/29/2017  . Bilateral lower extremity edema 01/09/2017  . Tracheostomy dependence ([place]) 05/26/2016  . 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 Bilateral vocal cord paralysis 12/22/2015  . Vocal cord paralysis, unilateral complete 12/22/2015  . Oropharyngeal dysphagia 12/02/2015  . Elevated troponin level 11/19/2015  . Hypertension 11/14/2015  . Type 2 diabetes mellitus, without long-term current use of insulin ([place]) 11/14/2015  . BPH (benign prostatic hyperplasia) 06/14/2014  . Epididymitis, right 05/17/2014  . Hematuria 05/17/2014  . Swollen testicle 05/17/2014  . Testis mass 05/17/2014  . Gross hematuria 10/19/2013  . Central cord syndrome ([place]) 10/13/2012    OCCUPATIONAL THERAPY DISCHARGE SUMMARY  Visits from Start of Care:  7  Current functional level related to goals / functional outcomes: Pt has met all LTG's - see above   Remaining deficits: LUE shoulder ROM Mild Lt hand strength/coordination deficits   Education / Equipment: HEP's  Plan: Patient agrees to discharge.  Patient goals were met. Patient is being discharged due to meeting the stated rehab goals.  ?????         Carey Bullocks, OTR/L 10/19/2018, 11:38 AM  [facility]  [place], [place], [address] Phone: [phone]   Fax:  [phone]  Name: Dale Key MRN: [id number] Date of Birth: September 08, 1955

## 2018-10-20 NOTE — Therapy (Signed)
[facility] [address] [place], [place], [address] Phone: [phone]   Fax:  [phone]  Physical Therapy Treatment  Patient Details  Name: Dale Key MRN: [id number] Date of Birth: 1955/02/02 Referring Provider (PT): Tracie Harrier MD   Encounter Date: 10/19/2018  PT End of Session - 10/19/18 1036    Visit Number  15    Number of Visits  17    Date for PT Re-Evaluation  11/15/18    Authorization Type  [employer] Medicare/Medicaid    PT Start Time  1036   pt late for appt today   PT Stop Time  1105    PT Time Calculation (min)  29 min    Equipment Utilized During Treatment  Gait belt    Activity Tolerance  Patient tolerated treatment well    Behavior During Therapy  WFL for tasks assessed/performed       Past Medical History:  Diagnosis Date  . Diabetes mellitus without complication ([place])   . Glaucoma   . Hyperlipidemia   . Hypertension   . Tracheostomy present [facility])     Past Surgical History:  Procedure Laterality Date  . ANTERIOR CERVICAL DECOMP/DISCECTOMY FUSION N/A 10/07/2012   Procedure: ANTERIOR CERVICAL DECOMPRESSION/DISCECTOMY FUSION 2 LEVELS;  Surgeon: Floyce Stakes, MD;  Location: [facility];  Service: Neurosurgery;  Laterality: N/A;  Anterior Cervical Three-Four/Four-Five Decompression and Fusion  . EYE SURGERY    . FOOT SURGERY Left    Pt states a tendon was removed to replace one in right hand.  Marland Kitchen HAND SURGERY Right   . TRACHEOSTOMY      There were no vitals filed for this visit.  Subjective Assessment - 10/19/18 1040    Subjective  No falls. Some left knee pain today.    Pertinent History  Hx TBI with central cord syndrome in 2014, with subsequent spinal cord compression and surgery; arthritis    Patient Stated Goals  Pt's goal for therapy is to be able to touch my head with my hands, raise legs better to get into car    Currently in Pain?  Yes    Pain Score  5     Pain Location  Knee     Pain Orientation  Left    Pain Descriptors / Indicators  Aching    Pain Type  Chronic pain    Pain Onset  More than a month ago    Pain Frequency  Intermittent    Aggravating Factors   putting pressure on it "like with bending down:.    Pain Relieving Factors  resting          [facility] Adult PT Treatment/Exercise - 10/19/18 1042      Transfers   Transfers  Sit to Stand;Stand to Sit    Sit to Stand  6: Modified independent (Device/Increase time);With upper extremity assist;Without upper extremity assist;From chair/3-in-1    Stand to Sit  6: Modified independent (Device/Increase time);With upper extremity assist;With armrests;To chair/3-in-1      Ambulation/Gait   Ambulation/Gait  No    Ambulation/Gait Assistance  5: Supervision    Ambulation Distance (Feet)  --   around gym with session   Assistive device  Straight cane    Gait Pattern  Step-through pattern;Decreased dorsiflexion - right;Decreased dorsiflexion - left;Lateral hip instability;Wide base of support;Poor foot clearance - left;Poor foot clearance - right    Ambulation Surface  Level;Indoor    Gait velocity  --      Self-Care  Self-Care  Other Self-Care Comments    Other Self-Care Comments   discussed community fitness options. Pt not comfortable with gyms at this time due to Covid. Discussed continuing with HEP and a walking program for home. The pt agrees with this plan.             Exercises   Exercises  Other Exercises    Other Exercises   reviewed with pt performing current HEP. advanced HEP today with no issues reported or noted in session. Refer to [facility] program for full details.        Issued today as a final HEP the following: cues needed on correct hold times and ex form/technique. Access Code: [email or web address]  URL: [email or web address]  Date: 10/19/2018  Prepared by: Willow Ora   Exercises Supine Bridge - 10 reps - 1-2 sets - 5 hold - 2x daily - 6x weekly Sit to Stand - 10 reps - 2-3  sets - 2x daily - 6x weekly Step Up - 10 reps - 2 sets - 2x daily - 6x weekly Heel Toe Raises with Unilateral Counter Support - 10 reps - 3 sets                            - 2x daily - 6x weekly Standing Single Leg Stance with Unilateral Counter Support - 3 reps - 1 sets - 30 hold - 2x daily - 6x weekly Standing Gastroc Stretch - 3 reps - 1 sets - 30 sec hold - 1-2x daily - 6x weekly Standing Gastroc Stretch on Step - 3 reps - 1 sets - 30 sec hold - 1-2x daily - 6x weekly Seated Hamstring Stretch - 3 sets - 30 hold - 2x daily - 6x weekly Lunge with Counter Support - 10 reps - 1-2 sets - 1x daily - 5x weekly Wall Squat - 10 reps - 1-2 sets - 1x daily - 5x weekly Tandem Walking with Counter Support - 4 reps - 1 sets - 1x daily - 5x weekly Mini Squat - 10 reps - 1-2 sets - 1x daily - 5x weekly      PT Education - 10/19/18 1103    Education Details  additions to/final HEP after review in session today; plan for discharge today    Person(s) Educated  Patient    Methods  Explanation;Demonstration;Verbal cues;Handout    Comprehension  Verbalized understanding;Returned demonstration       PT Short Term Goals - 09/23/18 1323      PT SHORT TERM GOAL #1   Title  Pt will be independent with HEP for improved strength, balance, gait.  TARGET 09/16/2018    Baseline  09/14/18- met with current program    Status  Achieved      PT SHORT TERM GOAL #2   Title  Pt will improve 5x sit<>stand score to less than or equal to 13 seconds, with UE support, for improved transfer efficiency and lower extremity strength.    Baseline  12.69 09/14/2018    Status  Achieved      PT SHORT TERM GOAL #3   Title  Pt will improve TUG score to less than or equal to 15 seconds for decreased fall risk.    Baseline  09/23/18: 11.62 ft/sec with cane    Status  Achieved    Target Date  --      PT SHORT TERM GOAL #4   Title  Pt will negotiatie at least 4  steps, step through pattern, modified independently for improved safe  stair negotiation.    Baseline  09/23/18: Mod I to ascend wtih rail, supervision to descend    Time  --    Period  --    Status  Partially Met    Target Date  --      PT SHORT TERM GOAL #5   Title  Pt will verbalize understanding of fall prevention in home environment.    Baseline  09/23/18: met in session today    Time  --    Period  --    Status  Achieved    Target Date  --        PT Long Term Goals - 10/19/18 1037      PT LONG TERM GOAL #1   Title  Pt will be independent with progression of HEP for improved strength, balance, gait.  TARGET 10/14/2018    Time  --    Period  --    Status  Achieved      PT LONG TERM GOAL #2   Title  Pt will report improved ease of transfers from <18" surfaces by at least 50%, demonstrating improved lower extremity strength.    Baseline  10/19/18: report's it easier. Able to get up from low/soft sofa now without issues.    Time  --    Period  --    Status  Achieved      PT LONG TERM GOAL #3   Title  Pt will improve TUG score to less than or equal to 13.5 sec for decreased fall risk.    Baseline  12.75 sec 10/17/2018    Status  Achieved      PT LONG TERM GOAL #4   Title  Pt will improve DGI score to at least 19/24 for decreased fall risk.    Baseline  16/24 10/17/2018-improved from 13/24    Status  Partially Met      PT LONG TERM GOAL #5   Title  Pt will improve gait velocity to at least 2.62 ft/sec for improved gait efficiency and safety in the community.    Baseline  2.4 ft/sec    Time  8    Period  Weeks    Status  Not Met            Plan - 10/19/18 1036    Clinical Impression Statement  Today's skilled session focused on progress toward remaining LTG and finializing HEP for discharge today. Pt agreeable to discharge today.    Comorbidities  Hx of TBI, spinal cord compression, central cord syndrome, peripheral neuropathy, bilateral carpal tunnel syndrome    Examination-Activity Limitations  Dressing;Reach Overhead;Locomotion  Level;Transfers    Examination-Participation Restrictions  Community Activity;Other    PT Frequency  2x / week    PT Duration  8 weeks    PT Treatment/Interventions  ADLs/Self Care Home Management;Gait training;Neuromuscular re-education;Balance training;Therapeutic exercise;Therapeutic activities;Functional mobility training;Stair training;Patient/family education    PT Next Visit Plan  discharge per PT plan of care    Consulted and Agree with Plan of Care  Patient       Patient will benefit from skilled therapeutic intervention in order to improve the following deficits and impairments:  Abnormal gait, Decreased balance, Decreased mobility, Decreased strength, Difficulty walking, Impaired flexibility  Visit Diagnosis: Muscle weakness (generalized)  Unsteadiness on feet  Other abnormalities of gait and mobility     Problem List Patient Active Problem List   Diagnosis Date Noted  .  Poor mobility 06/24/2018  . Urinary tract infection due to ESBL Klebsiella 06/08/2018  . Recurrent UTI 05/10/2018  . 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 BMI 40.0-44.9, adult ([place]) 11/25/2017  . Cervical disc disease with myelopathy 11/15/2017  . Anemia 10/20/2017  . Benign essential hypertension 10/20/2017  . Constipation 10/20/2017  . Controlled type 2 diabetes mellitus with hyperglycemia, without long-term current use of insulin ([place]) 10/20/2017  . Generalized osteoarthritis of multiple sites 10/20/2017  . Hoarseness or changing voice 10/20/2017  . 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 Upper extremity weakness 06/16/2017  . S/P cervical spinal fusion 04/29/2017  . Bilateral lower extremity edema 01/09/2017  . 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 Bilateral vocal cord paralysis 12/22/2015  . Vocal cord paralysis, unilateral complete 12/22/2015  . Oropharyngeal dysphagia 12/02/2015  . Elevated troponin level 11/19/2015  .  Hypertension 11/14/2015  . Type 2 diabetes mellitus, without long-term current use of insulin ([place]) 11/14/2015  . BPH (benign prostatic hyperplasia) 06/14/2014  . Epididymitis, right 05/17/2014  . Hematuria 05/17/2014  . Swollen testicle 05/17/2014  . Testis mass 05/17/2014  . Gross hematuria 10/19/2013  . Central cord syndrome [facility]) 10/13/2012    Willow Ora, PTA, [facility] [address] [place], [place] [address] [phone] 10/20/18, 4:07 PM   Name: Dale Key MRN: [id number] Date of Birth: 1955-03-04

## 2018-11-08 ENCOUNTER — Ambulatory Visit: Payer: Medicare Other | Admitting: Podiatry

## 2018-11-10 ENCOUNTER — Encounter: Payer: Self-pay | Admitting: Physical Therapy

## 2018-11-10 NOTE — Therapy (Signed)
[facility] [address] [place], [place], [address] Phone: [phone]   Fax:  [phone]  Patient Details  Name: Dale Key MRN: [id number] Date of Birth: 02-Nov-1955 Referring Provider:  No ref. provider found  Encounter Date: 11/10/2018   PHYSICAL THERAPY DISCHARGE SUMMARY  Visits from Start of Care: 15  Current functional level related to goals / functional outcomes: PT Long Term Goals - 10/19/18 1037      PT LONG TERM GOAL #1   Title  Pt will be independent with progression of HEP for improved strength, balance, gait.  TARGET 10/14/2018    Time  --    Period  --    Status  Achieved      PT LONG TERM GOAL #2   Title  Pt will report improved ease of transfers from <18" surfaces by at least 50%, demonstrating improved lower extremity strength.    Baseline  10/19/18: report's it easier. Able to get up from low/soft sofa now without issues.    Time  --    Period  --    Status  Achieved      PT LONG TERM GOAL #3   Title  Pt will improve TUG score to less than or equal to 13.5 sec for decreased fall risk.    Baseline  12.75 sec 10/17/2018    Status  Achieved      PT LONG TERM GOAL #4   Title  Pt will improve DGI score to at least 19/24 for decreased fall risk.    Baseline  16/24 10/17/2018-improved from 13/24    Status  Partially Met      PT LONG TERM GOAL #5   Title  Pt will improve gait velocity to at least 2.62 ft/sec for improved gait efficiency and safety in the community.    Baseline  2.4 ft/sec    Time  8    Period  Weeks    Status  Not Met      Pt has met 3 of 5 LTGs, with improvement in balance and strength measures.   Remaining deficits: Decreased balance, strength, gait velocity   Education / Equipment: Educated in [employer], fall prevention, ongoing fitness options.  Plan: Patient agrees to discharge.  Patient goals were partially met. Patient is being discharged due to being pleased with the  current functional level.  ?????       Jalan Fariss W. 11/10/2018, 1:06 PM Mady Haagensen, PT 11/10/18 1:08 PM Phone: [phone] Fax: [facility] [address] [place], [place], [address] Phone: [phone]   Fax:  [phone]

## 2019-01-31 ENCOUNTER — Ambulatory Visit: Payer: Medicare Other | Admitting: Podiatry

## 2019-02-20 ENCOUNTER — Other Ambulatory Visit: Payer: Self-pay

## 2019-02-20 NOTE — Patient Outreach (Signed)
[facility]) Care Management  02/20/2019  Cliff Damiani 29-Oct-1955 [id number]   Medication Adherence call to Mr. Dale Key [employer] [employer] spoke with patient he is past due on Metformin 1000 mg ,patient explain some times he only takes a 1/2 tablet depending on his sugar levels,patient is to take 2 tablets daily.patient explain he has plenty at this time. Dale Key is showing due under [employer].   Dale Key CPhT Pharmacy Technician [facility] Management Direct Dial [phone]  Fax [phone] [email or web address]

## 2019-04-07 ENCOUNTER — Telehealth: Payer: Self-pay | Admitting: Podiatry

## 2019-04-07 NOTE — Telephone Encounter (Signed)
Please give me a call. Thank you.

## 2019-04-10 ENCOUNTER — Ambulatory Visit (INDEPENDENT_AMBULATORY_CARE_PROVIDER_SITE_OTHER): Payer: Medicare Other | Admitting: Podiatry

## 2019-04-10 ENCOUNTER — Other Ambulatory Visit: Payer: Self-pay

## 2019-04-10 DIAGNOSIS — M79674 Pain in right toe(s): Secondary | ICD-10-CM | POA: Diagnosis not present

## 2019-04-10 DIAGNOSIS — M2042 Other hammer toe(s) (acquired), left foot: Secondary | ICD-10-CM

## 2019-04-10 DIAGNOSIS — L84 Corns and callosities: Secondary | ICD-10-CM | POA: Diagnosis not present

## 2019-04-10 DIAGNOSIS — B351 Tinea unguium: Secondary | ICD-10-CM | POA: Diagnosis not present

## 2019-04-10 DIAGNOSIS — M2041 Other hammer toe(s) (acquired), right foot: Secondary | ICD-10-CM

## 2019-04-10 DIAGNOSIS — M79675 Pain in left toe(s): Secondary | ICD-10-CM | POA: Diagnosis not present

## 2019-04-10 DIAGNOSIS — E119 Type 2 diabetes mellitus without complications: Secondary | ICD-10-CM | POA: Diagnosis not present

## 2019-04-10 NOTE — Patient Instructions (Signed)
Diabetes Mellitus and Foot Care Foot care is an important part of your health, especially when you have diabetes. Diabetes may cause you to have problems because of poor blood flow (circulation) to your feet and legs, which can cause your skin to:  Become thinner and drier.  Break more easily.  Heal more slowly.  Peel and crack. You may also have nerve damage (neuropathy) in your legs and feet, causing decreased feeling in them. This means that you may not notice minor injuries to your feet that could lead to more serious problems. Noticing and addressing any potential problems early is the best way to prevent future foot problems. How to care for your feet Foot hygiene  Wash your feet daily with warm water and mild soap. Do not use hot water. Then, pat your feet and the areas between your toes until they are completely dry. Do not soak your feet as this can dry your skin.  Trim your toenails straight across. Do not dig under them or around the cuticle. File the edges of your nails with an emery board or nail file.  Apply a moisturizing lotion or petroleum jelly to the skin on your feet and to dry, brittle toenails. Use lotion that does not contain alcohol and is unscented. Do not apply lotion between your toes. Shoes and socks  Wear clean socks or stockings every day. Make sure they are not too tight. Do not wear knee-high stockings since they may decrease blood flow to your legs.  Wear shoes that fit properly and have enough cushioning. Always look in your shoes before you put them on to be sure there are no objects inside.  To break in new shoes, wear them for just a few hours a day. This prevents injuries on your feet. Wounds, scrapes, corns, and calluses  Check your feet daily for blisters, cuts, bruises, sores, and redness. If you cannot see the bottom of your feet, use a mirror or ask someone for help.  Do not cut corns or calluses or try to remove them with medicine.  If you  find a minor scrape, cut, or break in the skin on your feet, keep it and the skin around it clean and dry. You may clean these areas with mild soap and water. Do not clean the area with peroxide, alcohol, or iodine.  If you have a wound, scrape, corn, or callus on your foot, look at it several times a day to make sure it is healing and not infected. Check for: ? Redness, swelling, or pain. ? Fluid or blood. ? Warmth. ? Pus or a bad smell. General instructions  Do not cross your legs. This may decrease blood flow to your feet.  Do not use heating pads or hot water bottles on your feet. They may burn your skin. If you have lost feeling in your feet or legs, you may not know this is happening until it is too late.  Protect your feet from hot and cold by wearing shoes, such as at the beach or on hot pavement.  Schedule a complete foot exam at least once a year (annually) or more often if you have foot problems. If you have foot problems, report any cuts, sores, or bruises to your health care provider immediately. Contact a health care provider if:  You have a medical condition that increases your risk of infection and you have any cuts, sores, or bruises on your feet.  You have an injury that is not   healing.  You have redness on your legs or feet.  You feel burning or tingling in your legs or feet.  You have pain or cramps in your legs and feet.  Your legs or feet are numb.  Your feet always feel cold.  You have pain around a toenail. Get help right away if:  You have a wound, scrape, corn, or callus on your foot and: ? You have pain, swelling, or redness that gets worse. ? You have fluid or blood coming from the wound, scrape, corn, or callus. ? Your wound, scrape, corn, or callus feels warm to the touch. ? You have pus or a bad smell coming from the wound, scrape, corn, or callus. ? You have a fever. ? You have a red line going up your leg. Summary  Check your feet every day  for cuts, sores, red spots, swelling, and blisters.  Moisturize feet and legs daily.  Wear shoes that fit properly and have enough cushioning.  If you have foot problems, report any cuts, sores, or bruises to your health care provider immediately.  Schedule a complete foot exam at least once a year (annually) or more often if you have foot problems. This information is not intended to replace advice given to you by your health care provider. Make sure you discuss any questions you have with your health care provider. Document Revised: 09/21/2018 Document Reviewed: 01/31/2016 Elsevier Patient Education  2020 Elsevier Inc.  Onychomycosis/Fungal Toenails  WHAT IS IT? An infection that lies within the keratin of your nail plate that is caused by a fungus.  WHY ME? Fungal infections affect all ages, sexes, races, and creeds.  There may be many factors that predispose you to a fungal infection such as age, coexisting medical conditions such as diabetes, or an autoimmune disease; stress, medications, fatigue, genetics, etc.  Bottom line: fungus thrives in a warm, moist environment and your shoes offer such a location.  IS IT CONTAGIOUS? Theoretically, yes.  You do not want to share shoes, nail clippers or files with someone who has fungal toenails.  Walking around barefoot in the same room or sleeping in the same bed is unlikely to transfer the organism.  It is important to realize, however, that fungus can spread easily from one nail to the next on the same foot.  HOW DO WE TREAT THIS?  There are several ways to treat this condition.  Treatment may depend on many factors such as age, medications, pregnancy, liver and kidney conditions, etc.  It is best to ask your doctor which options are available to you.  5. No treatment.   Unlike many other medical concerns, you can live with this condition.  However for many people this can be a painful condition and may lead to ingrown toenails or a bacterial  infection.  It is recommended that you keep the nails cut short to help reduce the amount of fungal nail. 6. Topical treatment.  These range from herbal remedies to prescription strength nail lacquers.  About 40-50% effective, topicals require twice daily application for approximately 9 to 12 months or until an entirely new nail has grown out.  The most effective topicals are medical grade medications available through physicians offices. 7. Oral antifungal medications.  With an 80-90% cure rate, the most common oral medication requires 3 to 4 months of therapy and stays in your system for a year as the new nail grows out.  Oral antifungal medications do require blood work to make   sure it is a safe drug for you.  A liver function panel will be performed prior to starting the medication and after the first month of treatment.  It is important to have the blood work performed to avoid any harmful side effects.  In general, this medication safe but blood work is required. 8. Laser Therapy.  This treatment is performed by applying a specialized laser to the affected nail plate.  This therapy is noninvasive, fast, and non-painful.  It is not covered by insurance and is therefore, out of pocket.  The results have been very good with a 80-95% cure rate.  The [facility] is the only practice in the area to offer this therapy. 9. Permanent Nail Avulsion.  Removing the entire nail so that a new nail will not grow back. 

## 2019-04-12 ENCOUNTER — Encounter: Payer: Self-pay | Admitting: Podiatry

## 2019-04-12 NOTE — Progress Notes (Addendum)
Subjective: Dale Key presents today for for annual diabetic foot examination and callus(es) b/l feet and painful mycotic toenails b/l that are difficult to trim. Pain interferes with ambulation. Aggravating factors include wearing enclosed shoe gear. Pain is relieved with periodic professional debridement.   He states he missed his last appointment due to losing his "soul mate" and he is still grieving her.   Also states he had to see another community podiatrist due to him not being able to get an appointment because we were booked.  Past Medical History:  Diagnosis Date  . Diabetes mellitus without complication (HCC)   . Glaucoma   . Hyperlipidemia   . Hypertension   . Tracheostomy present [facility])     Patient Active Problem List   Diagnosis Date Noted  . Poor mobility 06/24/2018  . Urinary tract infection due to ESBL Klebsiella 06/08/2018  . Recurrent UTI 05/10/2018  . 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 BMI 40.0-44.9, adult (HCC) 11/25/2017  . Cervical disc disease with myelopathy 11/15/2017  . Anemia 10/20/2017  . Benign essential hypertension 10/20/2017  . Constipation 10/20/2017  . Controlled type 2 diabetes mellitus with hyperglycemia, without long-term current use of insulin (HCC) 10/20/2017  . Generalized osteoarthritis of multiple sites 10/20/2017  . Hoarseness or changing voice 10/20/2017  . 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 Upper extremity weakness 06/16/2017  . S/P cervical spinal fusion 04/29/2017  . Bilateral lower extremity edema 01/09/2017  . 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 Bilateral vocal cord paralysis 12/22/2015  . Vocal cord paralysis, unilateral complete 12/22/2015  . Oropharyngeal dysphagia 12/02/2015  . Elevated troponin level 11/19/2015  . Hypertension 11/14/2015  . Type 2 diabetes mellitus, without long-term current use of insulin (HCC) 11/14/2015  . BPH (benign prostatic  hyperplasia) 06/14/2014  . Epididymitis, right 05/17/2014  . Hematuria 05/17/2014  . Swollen testicle 05/17/2014  . Testis mass 05/17/2014  . Gross hematuria 10/19/2013  . Central cord syndrome [facility]) 10/13/2012    Past Surgical History:  Procedure Laterality Date  . ANTERIOR CERVICAL DECOMP/DISCECTOMY FUSION N/A 10/07/2012   Procedure: ANTERIOR CERVICAL DECOMPRESSION/DISCECTOMY FUSION 2 LEVELS;  Surgeon: Karn Cassis, MD;  Location: [facility] OR;  Service: Neurosurgery;  Laterality: N/A;  Anterior Cervical Three-Four/Four-Five Decompression and Fusion  . EYE SURGERY    . FOOT SURGERY Left    Pt states a tendon was removed to replace one in right hand.  Marland Kitchen HAND SURGERY Right   . TRACHEOSTOMY      Current Outpatient Medications on File Prior to Visit  Medication Sig Dispense Refill  . albuterol (VENTOLIN HFA) 108 (90 Base) MCG/ACT inhaler Inhale into the lungs.    . brimonidine (ALPHAGAN P) 0.1 % SOLN Apply to eye.    . dorzolamide-timolol (COSOPT) 22.3-6.8 MG/ML ophthalmic solution Apply to eye.    . polyethylene glycol powder (GLYCOLAX/MIRALAX) 17 GM/SCOOP powder Take by mouth.    Marland Kitchen allopurinol (ZYLOPRIM) 300 MG tablet Take 300 mg by mouth daily.    Marland Kitchen aspirin 81 MG tablet Take 81 mg by mouth daily.    . cefdinir (OMNICEF) 300 MG capsule TAKE 1 CAPSULE BY MOUTH TWICE DAILY FOR 10 DAYS    . cefUROXime (CEFTIN) 250 MG tablet Take 250 mg by mouth 2 (two) times daily.    . Cholecalciferol (VITAMIN D-1000 MAX ST) 1000 units tablet Take 1,000 Units by mouth daily.    . Coenzyme Q10 10 MG capsule Take by mouth.    Marland Kitchen  cyclobenzaprine (FLEXERIL) 10 MG tablet Take 10 mg by mouth 2 (two) times daily as needed.    . fluticasone (FLONASE) 50 MCG/ACT nasal spray USE 1 SPRAY(S) IN EACH NOSTRIL TWICE DAILY    . fosfomycin (MONUROL) 3 g PACK Take by mouth.    . furosemide (LASIX) 40 MG tablet Take by mouth.    . linaclotide (LINZESS) 145 MCG CAPS capsule Take 145 mcg by mouth daily.    Marland Kitchen losartan  (COZAAR) 100 MG tablet Take 50 mg by mouth every morning.     . meloxicam (MOBIC) 7.5 MG tablet Take 7.5 mg by mouth daily.    . metFORMIN (GLUCOPHAGE) 1000 MG tablet Take by mouth.    . metoprolol succinate (TOPROL-XL) 50 MG 24 hr tablet Take 1 tablet (50 mg total) by mouth every morning. Take with or immediately following a meal. 30 tablet 1  . Multiple Vitamin (MULTI-VITAMINS) TABS Take by mouth.    . nitrofurantoin, macrocrystal-monohydrate, (MACROBID) 100 MG capsule TAKE 1 CAPSULE BY MOUTH TWICE DAILY FOR 5 DAYS    . Omega-3 Fatty Acids (OMEGA-3 2100) 1050 MG CAPS Take by mouth.    . potassium chloride SA (K-DUR,KLOR-CON) 20 MEQ tablet Take 1 tablet (20 mEq total) by mouth daily as needed (For potassium loss.). 30 tablet 0  . predniSONE (DELTASONE) 20 MG tablet 3 tabs po day one, then 2 po daily x 4 days (Patient not taking: Reported on 08/17/2018) 11 tablet 0  . rosuvastatin (CRESTOR) 10 MG tablet Take 10 mg by mouth daily.    Marland Kitchen sulfamethoxazole-trimethoprim (BACTRIM DS,SEPTRA DS) 800-160 MG tablet Take 1 tablet by mouth 2 (two) times daily. for 10 days    . UNABLE TO FIND Take by mouth.    . vitamin C (ASCORBIC ACID) 500 MG tablet Take by mouth.    . Vitamin D, Ergocalciferol, (DRISDOL) 1.25 MG (50000 UT) CAPS capsule TAKE ONE CAPSULE BY MOUTH ONCE A WEEK. WHEN FINISHED WITH THIS PRESCRIPTION PT SHOULD START TAKING OTC VITAMIN D3 1000 UNITS DAILY FOR 8 DOS     No current facility-administered medications on file prior to visit.     Allergies  Allergen Reactions  . Sulfamethoxazole-Trimethoprim Other (See Comments) and Diarrhea    Social History   Occupational History  . Not on file  Tobacco Use  . Smoking status: Former Smoker    Quit date: 01/13/2012    Years since quitting: 7.2  . Smokeless tobacco: Never Used  Substance and Sexual Activity  . Alcohol use: Yes  . Drug use: No  . Sexual activity: Not on file    No family history on file.  Immunization History   Administered Date(s) Administered  . Influenza,inj,Quad PF,6+ Mos 10/15/2012  . Tdap 10/03/2012     Objective: There were no vitals filed for this visit.  Dale Key is a/an 64 y.o. adult  in NAD. AAO X 3.  Vascular Examination: Capillary refill time to digits immediate b/l. Palpable DP pulses b/l. Palpable PT pulses b/l. Pedal hair absent b/l Skin temperature gradient within normal limits b/l. Nonpitting edema noted b/l LE. No pain with calf compression b/l.  Dermatological Examination: Pedal skin with normal turgor, texture and tone bilaterally. No open wounds bilaterally. No interdigital macerations bilaterally. Toenails 1-5 b/l elongated, dystrophic, thickened, crumbly with subungual debris and tenderness to dorsal palpation. Hyperkeratotic lesion(s) L hallux, L 3rd toe, R hallux, submet head 1 left foot and submet head 1 right foot.  No erythema, no edema, no drainage, no  flocculence.  Musculoskeletal Examination: Normal muscle strength 5/5 to all lower extremity muscle groups bilaterally, no pain crepitus or joint limitation noted with ROM b/l and hammertoes noted to the  L 2nd toe and L 3rd toe.  Neurological Examination: Protective sensation intact 5/5 intact bilaterally with 10g monofilament b/l Vibratory sensation intact b/l.  Assessment: 1. Pain due to onychomycosis of toenails of both feet   2. Corns and callosities   3. Acquired hammertoes of both feet   4. Controlled type 2 diabetes mellitus without complication, without long-term current use of insulin ([place])   5. Encounter for diabetic foot exam ([place])     Plan: -Diabetic foot examination performed on today's visit. -Continue diabetic foot care principles. Literature dispensed on today.  -Toenails 1-5 b/l were debrided in length and girth with sterile nail nippers and dremel without iatrogenic bleeding.  -Corn(s) L 3rd toe and callus(es) L hallux, R hallux, submet head 1 left foot and submet head 1 right foot were  debrided without complication or incident. Total number debrided 5. -Patient to continue soft, supportive shoe gear daily. -Patient to report any pedal injuries to medical professional immediately. -Dispensed Silipos toe cap for daily use for left 3rd toe. Apply every morning. Remove every evening. -Patient/POA to call should there be question/concern in the interim.  Return in about 9 weeks (around 06/12/2019) for diabetic nail and callus trim.

## 2019-05-08 DIAGNOSIS — Z9989 Dependence on other enabling machines and devices: Secondary | ICD-10-CM | POA: Insufficient documentation

## 2019-05-08 DIAGNOSIS — G4733 Obstructive sleep apnea (adult) (pediatric): Secondary | ICD-10-CM | POA: Insufficient documentation

## 2019-05-11 DIAGNOSIS — H2513 Age-related nuclear cataract, bilateral: Secondary | ICD-10-CM | POA: Insufficient documentation

## 2019-05-11 DIAGNOSIS — E113393 Type 2 diabetes mellitus with moderate nonproliferative diabetic retinopathy without macular edema, bilateral: Secondary | ICD-10-CM | POA: Insufficient documentation

## 2019-05-11 DIAGNOSIS — H409 Unspecified glaucoma: Secondary | ICD-10-CM | POA: Insufficient documentation

## 2019-06-26 ENCOUNTER — Ambulatory Visit: Payer: Medicare Other | Admitting: Podiatry

## 2019-09-08 ENCOUNTER — Other Ambulatory Visit: Payer: Self-pay | Admitting: Internal Medicine

## 2019-09-08 DIAGNOSIS — M5010 Cervical disc disorder with radiculopathy, unspecified cervical region: Secondary | ICD-10-CM

## 2019-09-29 ENCOUNTER — Ambulatory Visit: Payer: Medicare Other

## 2019-10-03 ENCOUNTER — Ambulatory Visit (INDEPENDENT_AMBULATORY_CARE_PROVIDER_SITE_OTHER): Payer: Medicare Other | Admitting: Podiatry

## 2019-10-03 ENCOUNTER — Encounter: Payer: Self-pay | Admitting: Podiatry

## 2019-10-03 ENCOUNTER — Other Ambulatory Visit: Payer: Self-pay

## 2019-10-03 DIAGNOSIS — M79674 Pain in right toe(s): Secondary | ICD-10-CM

## 2019-10-03 DIAGNOSIS — M79675 Pain in left toe(s): Secondary | ICD-10-CM | POA: Diagnosis not present

## 2019-10-03 DIAGNOSIS — E119 Type 2 diabetes mellitus without complications: Secondary | ICD-10-CM | POA: Diagnosis not present

## 2019-10-03 DIAGNOSIS — M2042 Other hammer toe(s) (acquired), left foot: Secondary | ICD-10-CM

## 2019-10-03 DIAGNOSIS — R600 Localized edema: Secondary | ICD-10-CM

## 2019-10-03 DIAGNOSIS — B351 Tinea unguium: Secondary | ICD-10-CM | POA: Diagnosis not present

## 2019-10-03 DIAGNOSIS — M2041 Other hammer toe(s) (acquired), right foot: Secondary | ICD-10-CM

## 2019-10-03 DIAGNOSIS — L84 Corns and callosities: Secondary | ICD-10-CM | POA: Diagnosis not present

## 2019-10-03 NOTE — Progress Notes (Signed)
Subjective:  Patient ID: Delynn Flavin, adult    DOB: 1956-01-02,  MRN: [id number]  64 y.o. adult presents with preventative diabetic foot care and painful corn(s) left 3rd toe , callus(es) b/l feet and painful mycotic nails.  Pain interferes with ambulation. Aggravating factors include wearing enclosed shoe gear. Painful toenails interfere with ambulation. Aggravating factors include wearing enclosed shoe gear. Pain is relieved with periodic professional debridement. Painful corns and calluses are aggravated when weightbearing with and without shoegear. Pain is relieved with periodic professional debridement.   Mr. [place] states he did not check his blood sugar this morning. Yesterday's reading was 120 mg/dl. He states he has not been taking his fluid pill regularly due to being in the process of moving. He also states he has not been elevating his feet in his recliner due to moving process. He is deciding between renting and buying.   Review of Systems: Negative except as noted in the HPI.  Past Medical History:  Diagnosis Date  . Diabetes mellitus without complication ([place])   . Glaucoma   . Hyperlipidemia   . Hypertension   . Tracheostomy present [facility])    Past Surgical History:  Procedure Laterality Date  . ANTERIOR CERVICAL DECOMP/DISCECTOMY FUSION N/A 10/07/2012   Procedure: ANTERIOR CERVICAL DECOMPRESSION/DISCECTOMY FUSION 2 LEVELS;  Surgeon: Floyce Stakes, MD;  Location: [place];  Service: Neurosurgery;  Laterality: N/A;  Anterior Cervical Three-Four/Four-Five Decompression and Fusion  . EYE SURGERY    . FOOT SURGERY Left    Pt states a tendon was removed to replace one in right hand.  Marland Kitchen HAND SURGERY Right   . TRACHEOSTOMY     Patient Active Problem List   Diagnosis Date Noted  . Glaucoma of right eye 05/11/2019  . Moderate nonproliferative diabetic retinopathy 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 edema associated with type 2 diabetes mellitus ([place]) 05/11/2019  . Nuclear sclerotic  cataract of both eyes 05/11/2019  . OSA on CPAP 05/08/2019  . Poor mobility 06/24/2018  . Urinary tract infection due to ESBL Klebsiella 06/08/2018  . Recurrent UTI 05/10/2018  . 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 BMI 40.0-44.9, adult ([place]) 11/25/2017  . Cervical disc disease with myelopathy 11/15/2017  . Anemia 10/20/2017  . Benign essential hypertension 10/20/2017  . Constipation 10/20/2017  . Controlled type 2 diabetes mellitus with hyperglycemia, without long-term current use of insulin ([place]) 10/20/2017  . Generalized osteoarthritis of multiple sites 10/20/2017  . Hoarseness or changing voice 10/20/2017  . 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 Upper extremity weakness 06/16/2017  . S/P cervical spinal fusion 04/29/2017  . Bilateral lower extremity edema 01/09/2017  . Tracheostomy dependence ([place]) 05/26/2016  . 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 Bilateral vocal cord paralysis 12/22/2015  . Vocal cord paralysis, unilateral complete 12/22/2015  . Oropharyngeal dysphagia 12/02/2015  . Low vitamin D level 11/19/2015  . Hypertension 11/14/2015  . Type 2 diabetes mellitus, without long-term current use of insulin ([place]) 11/14/2015  . BPH (benign prostatic hyperplasia) 06/14/2014  . Epididymitis, right 05/17/2014  . Hematuria 05/17/2014  . Swollen testicle 05/17/2014  . Testis mass 05/17/2014  . Gross hematuria 10/19/2013  . Central cord syndrome ([place]) 10/13/2012    Current Outpatient Medications:  .  Blood Glucose Monitoring Suppl (ACCU-CHEK GUIDE) w/Device KIT, 1 each by XX route as directed, Disp: , Rfl:  .  brimonidine (ALPHAGAN) 0.2 % ophthalmic solution, Apply to eye., Disp: , Rfl:  .  dorzolamide-timolol (COSOPT) 22.3-6.8 MG/ML ophthalmic solution, Apply to  eye., Disp: , Rfl:  .  fluticasone furoate-vilanterol (BREO ELLIPTA) 100-25 MCG/INH AEPB, Inhale into the lungs., Disp: , Rfl:  .  ACCU-CHEK GUIDE test strip, USE 1 STRIP TO CHECK BLOOD GLUCOSE 2  TIMES DAILY AS INSTRUCTED, Disp: , Rfl:  .  albuterol (VENTOLIN HFA) 108 (90 Base) MCG/ACT inhaler, Inhale into the lungs., Disp: , Rfl:  .  allopurinol (ZYLOPRIM) 300 MG tablet, Take 300 mg by mouth daily., Disp: , Rfl:  .  aspirin 81 MG tablet, Take 81 mg by mouth daily., Disp: , Rfl:  .  Blood Glucose Monitoring Suppl (ACCU-CHEK GUIDE) w/Device KIT, USE 1 AS DIRECTED, Disp: , Rfl:  .  brimonidine (ALPHAGAN) 0.2 % ophthalmic solution, Place 1 drop into the right eye 3 (three) times daily., Disp: , Rfl:  .  cefdinir (OMNICEF) 300 MG capsule, TAKE 1 CAPSULE BY MOUTH TWICE DAILY FOR 10 DAYS, Disp: , Rfl:  .  cefUROXime (CEFTIN) 250 MG tablet, Take 250 mg by mouth 2 (two) times daily., Disp: , Rfl:  .  Cholecalciferol (VITAMIN D-1000 MAX ST) 1000 units tablet, Take 1,000 Units by mouth daily., Disp: , Rfl:  .  Coenzyme Q10 10 MG capsule, Take by mouth., Disp: , Rfl:  .  cyclobenzaprine (FLEXERIL) 10 MG tablet, Take 10 mg by mouth 2 (two) times daily as needed., Disp: , Rfl:  .  doxycycline (VIBRA-TABS) 100 MG tablet, Take 100 mg by mouth 2 (two) times daily., Disp: , Rfl:  .  fluticasone (FLONASE) 50 MCG/ACT nasal spray, USE 1 SPRAY(S) IN EACH NOSTRIL TWICE DAILY, Disp: , Rfl:  .  fosfomycin (MONUROL) 3 g PACK, Take by mouth., Disp: , Rfl:  .  furosemide (LASIX) 40 MG tablet, Take by mouth., Disp: , Rfl:  .  linaclotide (LINZESS) 145 MCG CAPS capsule, Take 145 mcg by mouth daily., Disp: , Rfl:  .  losartan (COZAAR) 100 MG tablet, Take 50 mg by mouth every morning. , Disp: , Rfl:  .  meloxicam (MOBIC) 7.5 MG tablet, Take 7.5 mg by mouth daily., Disp: , Rfl:  .  metFORMIN (GLUCOPHAGE) 1000 MG tablet, Take by mouth., Disp: , Rfl:  .  metoprolol succinate (TOPROL-XL) 50 MG 24 hr tablet, Take 1 tablet (50 mg total) by mouth every morning. Take with or immediately following a meal., Disp: 30 tablet, Rfl: 1 .  Multiple Vitamin (MULTI-VITAMINS) TABS, Take by mouth., Disp: , Rfl:  .  nitrofurantoin,  macrocrystal-monohydrate, (MACROBID) 100 MG capsule, TAKE 1 CAPSULE BY MOUTH TWICE DAILY FOR 5 DAYS, Disp: , Rfl:  .  Omega-3 Fatty Acids (OMEGA-3 2100) 1050 MG CAPS, Take by mouth., Disp: , Rfl:  .  polyethylene glycol powder (GLYCOLAX/MIRALAX) 17 GM/SCOOP powder, Take by mouth., Disp: , Rfl:  .  potassium chloride SA (K-DUR,KLOR-CON) 20 MEQ tablet, Take 1 tablet (20 mEq total) by mouth daily as needed (For potassium loss.)., Disp: 30 tablet, Rfl: 0 .  predniSONE (DELTASONE) 20 MG tablet, 3 tabs po day one, then 2 po daily x 4 days (Patient not taking: Reported on 08/17/2018), Disp: 11 tablet, Rfl: 0 .  rosuvastatin (CRESTOR) 10 MG tablet, Take 10 mg by mouth daily., Disp: , Rfl:  .  sulfamethoxazole-trimethoprim (BACTRIM DS,SEPTRA DS) 800-160 MG tablet, Take 1 tablet by mouth 2 (two) times daily. for 10 days, Disp: , Rfl:  .  UNABLE TO FIND, Take by mouth., Disp: , Rfl:  .  vitamin C (ASCORBIC ACID) 500 MG tablet, Take by mouth., Disp: , Rfl:  .  Vitamin D, Ergocalciferol, (DRISDOL) 1.25 MG (50000 UT) CAPS capsule, TAKE ONE CAPSULE BY MOUTH ONCE A WEEK. WHEN FINISHED WITH THIS PRESCRIPTION PT SHOULD START TAKING OTC VITAMIN D3 1000 UNITS DAILY FOR 8 DOS, Disp: , Rfl:  Allergies  Allergen Reactions  . Sulfamethoxazole-Trimethoprim Other (See Comments) and Diarrhea   Social History   Tobacco Use  Smoking Status Former Smoker  . Quit date: 01/13/2012  . Years since quitting: 7.7  Smokeless Tobacco Never Used   Objective:  There were no vitals filed for this visit. Constitutional Patient is a pleasant 64 y.o. African American adult morbidly obese in NAD. AAO x 3.  Vascular Capillary fill time to digits <3 seconds b/l lower extremities, left foot, right foot, left ankle and right ankle. Nonpalpable DP pulse(s) b/l lower extremities. Nonpalpable PT pulse(s) b/l lower extremities. Pedal hair absent. Lower extremity skin temperature gradient within normal limits. +2 pitting edema b/l lower  extremities. No ischemia or gangrene noted b/l lower extremities. I cannot feel his pulses due the the amount of edema he has in his feet today. No cyanosis or clubbing noted.  Neurologic Normal speech. Oriented to person, place, and time. Protective sensation intact 5/5 intact bilaterally with 10g monofilament b/l. Vibratory sensation intact b/l.  Dermatologic Pedal skin with normal turgor, texture and tone bilaterally. No open wounds bilaterally. No interdigital macerations bilaterally. Toenails 1-5 b/l elongated, discolored, dystrophic, thickened, crumbly with subungual debris and tenderness to dorsal palpation. Hyperkeratotic lesion(s) L hallux, L 3rd toe, R hallux, submet head 1 left foot and submet head 1 right foot.  No erythema, no edema, no drainage, no flocculence.  Orthopedic: Normal muscle strength 5/5 to all lower extremity muscle groups bilaterally. No pain crepitus or joint limitation noted with ROM b/l. Hammertoes noted to the 2-5 bilaterally. He is wearing house slippers on today's visit.   Assessment:   1. Pain due to onychomycosis of toenails of both feet   2. Corns and callosities   3. Localized edema   4. Acquired hammertoes of both feet   5. Controlled type 2 diabetes mellitus without complication, without long-term current use of insulin ([place])    Plan:  Patient was evaluated and treated and all questions answered.  Onychomycosis with pain -Nails palliatively debridement as below. -Educated on self-care  Procedure: Nail Debridement Rationale: Pain Type of Debridement: manual, sharp debridement. Instrumentation: Nail nipper, rotary burr. Number of Nails: 10  -Examined patient.  -Advised him to take diuretic as prescribed to avoid fluid overload and leg/ankle wounds. He related understanding. -We also discussed diabetic shoes and he would like to defer for now. -Continue diabetic foot care principles. -Toenails 1-5 b/l were debrided in length and girth with sterile  nail nippers and dremel without iatrogenic bleeding.  -Corn(s) L 3rd toe pared utilizing sterile scalpel blade without complication or incident. Total number debrided=1. -Callus(es) L hallux, R hallux, submet head 1 left foot and submet head 1 right foot pared utilizing sterile scalpel blade without complication or incident. Total number debrided =4. -Patient to report any pedal injuries to medical professional immediately. -Patient to continue soft, supportive shoe gear daily. -Patient/POA to call should there be question/concern in the interim.  Return in about 3 months (around 01/02/2020) for diabetic toenails, corn(s)/callus(es).  Marzetta Board, DPM

## 2019-11-06 DIAGNOSIS — R591 Generalized enlarged lymph nodes: Secondary | ICD-10-CM | POA: Insufficient documentation

## 2019-11-13 DIAGNOSIS — E041 Nontoxic single thyroid nodule: Secondary | ICD-10-CM | POA: Insufficient documentation

## 2019-11-17 ENCOUNTER — Other Ambulatory Visit: Payer: Self-pay | Admitting: Internal Medicine

## 2020-01-02 ENCOUNTER — Ambulatory Visit: Payer: Medicare Other | Admitting: Podiatry

## 2020-04-29 ENCOUNTER — Other Ambulatory Visit: Payer: Self-pay

## 2020-04-29 ENCOUNTER — Ambulatory Visit (INDEPENDENT_AMBULATORY_CARE_PROVIDER_SITE_OTHER): Payer: Medicare Other | Admitting: Podiatrist

## 2020-04-29 DIAGNOSIS — M79674 Pain in right toe(s): Secondary | ICD-10-CM

## 2020-04-29 DIAGNOSIS — E119 Type 2 diabetes mellitus without complications: Secondary | ICD-10-CM | POA: Diagnosis not present

## 2020-04-29 DIAGNOSIS — L84 Corns and callosities: Secondary | ICD-10-CM | POA: Diagnosis not present

## 2020-04-29 DIAGNOSIS — B351 Tinea unguium: Secondary | ICD-10-CM | POA: Diagnosis not present

## 2020-04-29 DIAGNOSIS — M79675 Pain in left toe(s): Secondary | ICD-10-CM | POA: Diagnosis not present

## 2020-04-29 DIAGNOSIS — R600 Localized edema: Secondary | ICD-10-CM

## 2020-04-29 NOTE — Patient Instructions (Signed)
Diabetes Mellitus and Foot Care Foot care is an important part of your health, especially when you have diabetes. Diabetes may cause you to have problems because of poor blood flow (circulation) to your feet and legs, which can cause your skin to:  Become thinner and drier.  Break more easily.  Heal more slowly.  Peel and crack. You may also have nerve damage (neuropathy) in your legs and feet, causing decreased feeling in them. This means that you may not notice minor injuries to your feet that could lead to more serious problems. Noticing and addressing any potential problems early is the best way to prevent future foot problems. How to care for your feet Foot hygiene  Wash your feet daily with warm water and mild soap. Do not use hot water. Then, pat your feet and the areas between your toes until they are completely dry. Do not soak your feet as this can dry your skin.  Apply a moisturizing lotion or petroleum jelly to the skin on your feet and to dry, brittle toenails. Use lotion that does not contain alcohol and is unscented. Do not apply lotion between your toes.   Shoes and socks  Wear clean socks or stockings every day. Make sure they are not too tight. Do not wear knee-high stockings since they may decrease blood flow to your legs.  Wear shoes that fit properly and have enough cushioning. Always look in your shoes before you put them on to be sure there are no objects inside.  To break in new shoes, wear them for just a few hours a day. This prevents injuries on your feet. Wounds, scrapes, corns, and calluses  Check your feet daily for blisters, cuts, bruises, sores, and redness. If you cannot see the bottom of your feet, use a mirror or ask someone for help.  Do not cut corns or calluses or try to remove them with medicine.  If you find a minor scrape, cut, or break in the skin on your feet, keep it and the skin around it clean and dry. You may clean these areas with mild  soap and water. Do not clean the area with peroxide, alcohol, or iodine.  If you have a wound, scrape, corn, or callus on your foot, look at it several times a day to make sure it is healing and not infected. Check for: ? Redness, swelling, or pain. ? Fluid or blood. ? Warmth. ? Pus or a bad smell.   General tips  Do not cross your legs. This may decrease blood flow to your feet.  Do not use heating pads or hot water bottles on your feet. They may burn your skin. If you have lost feeling in your feet or legs, you may not know this is happening until it is too late.  Protect your feet from hot and cold by wearing shoes, such as at the beach or on hot pavement.  Schedule a complete foot exam at least once a year (annually) or more often if you have foot problems. Report any cuts, sores, or bruises to your health care provider immediately. Where to find more information  American Diabetes Association: www.diabetes.org  Association of Diabetes Care & Education Specialists: www.diabeteseducator.org Contact a health care provider if:  You have a medical condition that increases your risk of infection and you have any cuts, sores, or bruises on your feet.  You have an injury that is not healing.  You have redness on your legs or feet.    You feel burning or tingling in your legs or feet.  You have pain or cramps in your legs and feet.  Your legs or feet are numb.  Your feet always feel cold.  You have pain around any toenails. Get help right away if:  You have a wound, scrape, corn, or callus on your foot and: ? You have pain, swelling, or redness that gets worse. ? You have fluid or blood coming from the wound, scrape, corn, or callus. ? Your wound, scrape, corn, or callus feels warm to the touch. ? You have pus or a bad smell coming from the wound, scrape, corn, or callus. ? You have a fever. ? You have a red line going up your leg. Summary  Check your feet every day for  blisters, cuts, bruises, sores, and redness.  Apply a moisturizing lotion or petroleum jelly to the skin on your feet and to dry, brittle toenails.  Wear shoes that fit properly and have enough cushioning.  If you have foot problems, report any cuts, sores, or bruises to your health care provider immediately.  Schedule a complete foot exam at least once a year (annually) or more often if you have foot problems. This information is not intended to replace advice given to you by your health care provider. Make sure you discuss any questions you have with your health care provider. Document Revised: 07/20/2019 Document Reviewed: 07/20/2019 Elsevier Patient Education  2021 [employer]  

## 2020-05-04 ENCOUNTER — Encounter: Payer: Self-pay | Admitting: Podiatrist

## 2020-05-04 NOTE — Progress Notes (Signed)
Chief Complaint  Patient presents with  . Nail Problem    3 month routine foot care     HPI: 65 y.o. adult presents with preventative diabetic foot care and painful corn(s) left 3rd toe , callus(es) b/l feet and painful mycotic nails.  Pain interferes with ambulation. Aggravating factors include wearing enclosed shoe gear. Painful toenails interfere with ambulation. Aggravating factors include wearing enclosed shoe gear. Pain is relieved with periodic professional debridement. Painful corns and calluses are aggravated when weightbearing with and without shoegear. Pain is relieved with periodic professional debridement.      Allergies  Allergen Reactions  . Sulfamethoxazole-Trimethoprim Other (See Comments) and Diarrhea    Review of systems is reviewed and negative.   Physical Exam  Patient is awake, alert, and oriented x 3.  In no acute distress.    Vascular status is intact with palpable pedal pulses DP and non palpable PT pulses bilateral.  capillary refill time less than 3 seconds bilateral. moderate edema 2+  Noted bilateral. Pedal hair growth is absent bilateral .  Neurological exam reveals epicritic and protective sensation grossly intact bilateral.   Dermatological exam reveals skin is supple and dry to bilateral feet.  No open lesions present.  Digital nails are thick, discolored, dystrophic, brittle with subungual debris present and clinically mycotic x 10.   Hyperkeratotic lesions noted medial hallux bilateral;  First metatarsal head medially, bilateral;  And left third toe plantarly.  No redness, no drainaige, no sign of infection noted.   Musculoskeletal exam: Musculature intact with dorsiflexion, plantarflexion, inversion, eversion. Hammertoes present 2-5 bilaterally.     Assessment:   ICD-10-CM   1. Pain due to onychomycosis of toenails of both feet  B35.1    M79.675    M79.674   2. Corns and callosities  L84   3. Localized edema  R60.0   4. Controlled type 2  diabetes mellitus without complication, without long-term current use of insulin (HCC)  E11.9      Plan:  -Examined patient.  -Continue diabetic foot care principles. -Toenails 1-5 b/l were debrided in length and girth with sterile nail nippers and dremel without iatrogenic bleeding.  -Corn(s) L 3rd toe.  pared utilizing sterile scalpel blade without complication or incident. Total number debrided=1. -Callus(es) L hallux, R hallux, submet head 1 left foot and submet head 1 right foot pared utilizing sterile scalpel blade without complication or incident. Total number debrided =4. -Patient to report any pedal injuries to medical professional immediately. -Patient to continue soft, supportive shoe gear daily. -Patient/POA to call should there be question/concern in the interim. -He will return in 3 months for continued preventive care.

## 2020-05-10 ENCOUNTER — Telehealth: Payer: Self-pay | Admitting: Podiatry

## 2020-05-10 NOTE — Telephone Encounter (Signed)
Pt left message asking for a call back.  I returned call and left message for pt to call me back not sure what he was needing.

## 2020-07-30 DIAGNOSIS — R0609 Other forms of dyspnea: Secondary | ICD-10-CM | POA: Insufficient documentation

## 2020-07-30 DIAGNOSIS — R0600 Dyspnea, unspecified: Secondary | ICD-10-CM | POA: Insufficient documentation

## 2020-08-05 ENCOUNTER — Encounter: Payer: Self-pay | Admitting: Podiatry

## 2020-08-05 ENCOUNTER — Ambulatory Visit (INDEPENDENT_AMBULATORY_CARE_PROVIDER_SITE_OTHER): Payer: Medicare Other | Admitting: Podiatry

## 2020-08-05 ENCOUNTER — Other Ambulatory Visit: Payer: Self-pay

## 2020-08-05 DIAGNOSIS — K5904 Chronic idiopathic constipation: Secondary | ICD-10-CM | POA: Insufficient documentation

## 2020-08-05 DIAGNOSIS — M79674 Pain in right toe(s): Secondary | ICD-10-CM | POA: Diagnosis not present

## 2020-08-05 DIAGNOSIS — R634 Abnormal weight loss: Secondary | ICD-10-CM | POA: Insufficient documentation

## 2020-08-05 DIAGNOSIS — M79675 Pain in left toe(s): Secondary | ICD-10-CM | POA: Diagnosis not present

## 2020-08-05 DIAGNOSIS — B351 Tinea unguium: Secondary | ICD-10-CM

## 2020-08-05 DIAGNOSIS — Z860100 Personal history of colon polyps, unspecified: Secondary | ICD-10-CM | POA: Insufficient documentation

## 2020-08-05 DIAGNOSIS — E1151 Type 2 diabetes mellitus with diabetic peripheral angiopathy without gangrene: Secondary | ICD-10-CM

## 2020-08-05 DIAGNOSIS — K219 Gastro-esophageal reflux disease without esophagitis: Secondary | ICD-10-CM | POA: Insufficient documentation

## 2020-08-05 DIAGNOSIS — Z1211 Encounter for screening for malignant neoplasm of colon: Secondary | ICD-10-CM | POA: Insufficient documentation

## 2020-08-05 DIAGNOSIS — M545 Low back pain, unspecified: Secondary | ICD-10-CM | POA: Insufficient documentation

## 2020-08-08 NOTE — Progress Notes (Signed)
  Subjective:  Patient ID: Dale Key, adult    DOB: 09-23-1955,  MRN: [id number]  Ether Wolters presents to clinic today for preventative diabetic foot care and painful thick toenails that are difficult to trim. Pain interferes with ambulation. Aggravating factors include wearing enclosed shoe gear. Pain is relieved with periodic professional debridement.   PCP is Barbette Reichmann, MD , and last visit was 07/25/2020.  Allergies  Allergen Reactions   Sulfamethoxazole-Trimethoprim Other (See Comments) and Diarrhea    Review of Systems: Negative except as noted in the HPI. Objective:   Constitutional Dale Key is a pleasant 65 y.o. African American adult, morbidly obese in NAD. AAO x 3.   Vascular Capillary fill time to digits <3 seconds b/l lower extremities. Nonpalpable DP pulse(s) b/l lower extremities. Nonpalpable PT pulse(s) b/l lower extremities. Pedal hair absent. Lower extremity skin temperature gradient within normal limits. No pain with calf compression b/l. +2 pitting edema b/l lower extremities. No cyanosis or clubbing noted.  Neurologic Normal speech. Oriented to person, place, and time. Protective sensation intact 5/5 intact bilaterally with 10g monofilament b/l. Vibratory sensation intact b/l.  Dermatologic Pedal skin with normal turgor, texture and tone b/l lower extremities. Toenails 1-5 b/l elongated, discolored, dystrophic, thickened, crumbly with subungual debris and tenderness to dorsal palpation. Preulcerative lesion noted L 3rd toe. There is visible subdermal hemorrhage. There is no surrounding erythema, no edema, no drainage, no odor, no fluctuance.  Orthopedic: Normal muscle strength 5/5 to all lower extremity muscle groups bilaterally. No pain crepitus or joint limitation noted with ROM b/l. Hammertoe(s) noted to the 2-5 bilaterally.   Radiographs: None Assessment:   1. Pain due to onychomycosis of toenails of both feet   2. Type II diabetes mellitus with  peripheral circulatory disorder (HCC)    Plan:  -Examined patient. -Left 3rd digit preulcerative lesion pared as a courstesy. Dispensed toe crest for him to apply every morning and remove every evening. -Continue diabetic foot care principles. -Patient to continue soft, supportive shoe gear daily. -Toenails 1-5 b/l were debrided in length and girth with sterile nail nippers and dremel without iatrogenic bleeding.  -Patient to report any pedal injuries to medical professional immediately. -Patient/POA to call should there be question/concern in the interim.  Return in about 10 weeks (around 10/14/2020).  Freddie Breech, DPM

## 2020-10-22 ENCOUNTER — Encounter: Payer: Self-pay | Admitting: Podiatry

## 2020-10-22 ENCOUNTER — Other Ambulatory Visit: Payer: Self-pay

## 2020-10-22 ENCOUNTER — Ambulatory Visit (INDEPENDENT_AMBULATORY_CARE_PROVIDER_SITE_OTHER): Payer: Medicare Other | Admitting: Podiatry

## 2020-10-22 DIAGNOSIS — B351 Tinea unguium: Secondary | ICD-10-CM | POA: Diagnosis not present

## 2020-10-22 DIAGNOSIS — E1151 Type 2 diabetes mellitus with diabetic peripheral angiopathy without gangrene: Secondary | ICD-10-CM

## 2020-10-22 DIAGNOSIS — M79675 Pain in left toe(s): Secondary | ICD-10-CM | POA: Diagnosis not present

## 2020-10-22 DIAGNOSIS — M79674 Pain in right toe(s): Secondary | ICD-10-CM | POA: Diagnosis not present

## 2020-10-26 ENCOUNTER — Encounter: Payer: Self-pay | Admitting: Podiatry

## 2020-10-26 NOTE — Progress Notes (Signed)
  Subjective:  Patient ID: Dale Key, adult    DOB: 09-25-1955,  MRN: [id number]  Dale Key presents to clinic today for preventative diabetic foot care and painful thick toenails that are difficult to trim. Pain interferes with ambulation. Aggravating factors include wearing enclosed shoe gear. Pain is relieved with periodic professional debridement.   PCP is Barbette Reichmann, MD , and last visit was 07/25/2020.  Allergies  Allergen Reactions   Sulfamethoxazole-Trimethoprim Other (See Comments) and Diarrhea   Other Nausea Only    Review of Systems: Negative except as noted in the HPI. Objective:   Constitutional Dale Key is a pleasant 65 y.o. African American adult, morbidly obese in NAD. AAO x 3.   Vascular Capillary fill time to digits <3 seconds b/l lower extremities. Nonpalpable DP pulse(s) b/l lower extremities. Nonpalpable PT pulse(s) b/l lower extremities. Pedal hair absent. Lower extremity skin temperature gradient within normal limits. No pain with calf compression b/l. +2 pitting edema b/l lower extremities. No cyanosis or clubbing noted.  Neurologic Normal speech. Oriented to person, place, and time. Protective sensation intact 5/5 intact bilaterally with 10g monofilament b/l. Vibratory sensation intact b/l.  Dermatologic Pedal skin with normal turgor, texture and tone b/l lower extremities. Toenails 1-5 b/l elongated, discolored, dystrophic, thickened, crumbly with subungual debris and tenderness to dorsal palpation. Preulcerative lesion noted L 3rd toe. There is visible subdermal hemorrhage. There is no surrounding erythema, no edema, no drainage, no odor, no fluctuance.  Orthopedic: Normal muscle strength 5/5 to all lower extremity muscle groups bilaterally. No pain crepitus or joint limitation noted with ROM b/l. Hammertoe(s) noted to the 2-5 bilaterally.   Radiographs: None Assessment:   1. Pain due to onychomycosis of toenails of both feet   2. Type II  diabetes mellitus with peripheral circulatory disorder (HCC)    Plan:  -Examined patient. -Left 3rd digit preulcerative lesion pared as a courstesy. Dispensed toe crest for him to apply every morning and remove every evening. -Continue diabetic foot care principles: inspect feet daily, monitor glucose as recommended by PCP and/or Endocrinologist, and follow prescribed diet per PCP, Endocrinologist and/or dietician. -Patient to continue soft, supportive shoe gear daily. -Toenails 1-5 b/l were debrided in length and girth with sterile nail nippers and dremel without iatrogenic bleeding.  -Patient to report any pedal injuries to medical professional immediately. -Patient/POA to call should there be question/concern in the interim.  Return in about 3 months (around 01/22/2021).  Dale Key, DPM

## 2020-11-13 ENCOUNTER — Encounter (HOSPITAL_BASED_OUTPATIENT_CLINIC_OR_DEPARTMENT_OTHER): Payer: Medicare Other | Attending: Internal Medicine | Admitting: Internal Medicine

## 2020-11-13 ENCOUNTER — Other Ambulatory Visit: Payer: Self-pay

## 2020-11-13 DIAGNOSIS — I872 Venous insufficiency (chronic) (peripheral): Secondary | ICD-10-CM | POA: Insufficient documentation

## 2020-11-13 DIAGNOSIS — I890 Lymphedema, not elsewhere classified: Secondary | ICD-10-CM | POA: Diagnosis not present

## 2020-11-13 DIAGNOSIS — S0081XA Abrasion of other part of head, initial encounter: Secondary | ICD-10-CM | POA: Insufficient documentation

## 2020-11-13 DIAGNOSIS — R269 Unspecified abnormalities of gait and mobility: Secondary | ICD-10-CM | POA: Insufficient documentation

## 2020-11-13 DIAGNOSIS — I10 Essential (primary) hypertension: Secondary | ICD-10-CM | POA: Insufficient documentation

## 2020-11-13 DIAGNOSIS — W07XXXA Fall from chair, initial encounter: Secondary | ICD-10-CM | POA: Diagnosis not present

## 2020-11-13 DIAGNOSIS — G4733 Obstructive sleep apnea (adult) (pediatric): Secondary | ICD-10-CM | POA: Diagnosis not present

## 2020-11-13 DIAGNOSIS — E1142 Type 2 diabetes mellitus with diabetic polyneuropathy: Secondary | ICD-10-CM | POA: Diagnosis not present

## 2020-11-13 NOTE — Progress Notes (Addendum)
STANLY, SI ([id number]) Visit Report for 11/13/2020 Allergy List Details Patient Name: Date of Service: Dale Key, Dale Key 11/13/2020 7:30 A M Medical Record Number: [id number] Patient Account Number: Dale Key Date of Birth/Sex: Treating RN: 12-20-1955 (65 y.o. Male) Fonnie Mu Primary Care Trigo Winterbottom: Barbette Reichmann Other Clinician: Referring Icie Kuznicki: Treating Brittiny Levitz/Extender: Valentino Saxon in Treatment: 0 Allergies Active Allergies No Known Drug Allergies Allergy Notes Electronic Signature(s) Signed: 11/13/2020 4:50:32 PM By: Fonnie Mu RN Entered By: Fonnie Mu on 11/13/2020 08:05:35 -------------------------------------------------------------------------------- Arrival Information Details Patient Name: Date of Service: Dale Key, Dale Key 11/13/2020 7:30 A M Medical Record Number: [id number] Patient Account Number: Dale Key Date of Birth/Sex: Treating RN: Jul 04, 1955 (65 y.o. Male) Fonnie Mu Primary Care Shelia Kingsberry: Barbette Reichmann Other Clinician: Referring Elya Diloreto: Treating Chrissa Meetze/Extender: Valentino Saxon in Treatment: 0 Visit Information Patient Arrived: Danella Maiers Time: 08:04 Accompanied By: self Transfer Assistance: None Patient Identification Verified: Yes Secondary Verification Process Completed: Yes Patient Requires Transmission-Based Precautions: No Patient Has Alerts: No History Since Last Visit Added or deleted any medications: No Any new allergies or adverse reactions: No Had a fall or experienced change in activities of daily living that may affect risk of falls: No Signs or symptoms of abuse/neglect since last visito No Hospitalized since last visit: No Implantable device outside of the clinic excluding cellular tissue based products placed in the center since last visit: No Has Dressing in Place as Prescribed: Yes Electronic Signature(s) Signed: 11/13/2020 4:50:32 PM By: Fonnie Mu RN Entered By:  Fonnie Mu on 11/13/2020 08:05:26 -------------------------------------------------------------------------------- Clinic Level of Care Assessment Details Patient Name: Date of Service: Dale Key, Dale Key 11/13/2020 7:30 A M Medical Record Number: [id number] Patient Account Number: Dale Key Date of Birth/Sex: Treating RN: 08/20/1955 (65 y.o. Male) Fonnie Mu Primary Care Antiono Ettinger: Barbette Reichmann Other Clinician: Referring Braxtin Bamba: Treating Jenella Craigie/Extender: Valentino Saxon in Treatment: 0 Clinic Level of Care Assessment Items TOOL 4 Quantity Score X- 1 0 Use when only an EandM is performed on FOLLOW-UP visit ASSESSMENTS - Nursing Assessment / Reassessment X- 1 10 Reassessment of Co-morbidities (includes updates in patient status) X- 1 5 Reassessment of Adherence to Treatment Plan ASSESSMENTS - Wound and Skin A ssessment / Reassessment []  - 0 Simple Wound Assessment / Reassessment - one wound []  - 0 Complex Wound Assessment / Reassessment - multiple wounds []  - 0 Dermatologic / Skin Assessment (not related to wound area) ASSESSMENTS - Focused Assessment []  - 0 Circumferential Edema Measurements - multi extremities []  - 0 Nutritional Assessment / Counseling / Intervention []  - 0 Lower Extremity Assessment (monofilament, tuning fork, pulses) []  - 0 Peripheral Arterial Disease Assessment (using hand held doppler) ASSESSMENTS - Ostomy and/or Continence Assessment and Care []  - 0 Incontinence Assessment and Management []  - 0 Ostomy Care Assessment and Management (repouching, etc.) PROCESS - Coordination of Care X - Simple Patient / Family Education for ongoing care 1 15 []  - 0 Complex (extensive) Patient / Family Education for ongoing care X- 1 10 Staff obtains , Records, T Results / Process Orders est []  - 0 Staff telephones HHA, Nursing Homes / Clarify orders / etc []  - 0 Routine Transfer to another Facility (non-emergent condition) []  -  0 Routine Hospital Admission (non-emergent condition) X- 1 15 New Admissions / / Ordering NPWT Apligraf, etc. , []  - 0 Emergency Hospital Admission (emergent condition) X- 1 10 Simple Discharge Coordination []  - 0 Complex (extensive) Discharge Coordination PROCESS - Special Needs []  - 0 Pediatric / Minor Patient Management []  -  0 Isolation Patient Management []  - 0 Hearing / Language / Visual special needs []  - 0 Assessment of Community assistance (transportation, D/C planning, etc.) []  - 0 Additional assistance / Altered mentation []  - 0 Support Surface(s) Assessment (bed, cushion, seat, etc.) INTERVENTIONS - Wound Cleansing / Measurement []  - 0 Simple Wound Cleansing - one wound []  - 0 Complex Wound Cleansing - multiple wounds []  - 0 Wound Imaging (photographs - any number of wounds) []  - 0 Wound Tracing (instead of photographs) []  - 0 Simple Wound Measurement - one wound []  - 0 Complex Wound Measurement - multiple wounds INTERVENTIONS - Wound Dressings []  - 0 Small Wound Dressing one or multiple wounds []  - 0 Medium Wound Dressing one or multiple wounds []  - 0 Large Wound Dressing one or multiple wounds []  - 0 Application of Medications - topical []  - 0 Application of Medications - injection INTERVENTIONS - Miscellaneous []  - 0 External ear exam []  - 0 Specimen Collection (cultures, biopsies, blood, body fluids, etc.) []  - 0 Specimen(s) / Culture(s) sent or taken to Lab for analysis []  - 0 Patient Transfer (multiple staff / / Similar devices) []  - 0 Simple Staple / Suture removal (25 or less) []  - 0 Complex Staple / Suture removal (26 or more) []  - 0 Hypo / Hyperglycemic Management (close monitor of Blood Glucose) []  - 0 Ankle / Brachial Index (ABI) - do not check if billed separately X- 1 5 Vital Signs Has the patient been seen at the hospital within the last three years: Yes Total Score: 70 Level Of Care:  New/Established - Level 2 Electronic Signature(s) Signed: 01/30/2021 12:38:30 PM By: RN Entered By: on 12/03/2020 13:48:56 -------------------------------------------------------------------------------- Encounter Discharge Information Details Patient Name: Date of Service: Dale , Dale Key 11/13/2020 7:30 A M Medical Record Number: Patient Account Number: Date of Birth/Sex: Treating RN: 13-Mar-1955 (65 y.o. Male) Primary Care Fateh Kindle: Other Clinician: Referring Armanii Pressnell: Treating Shaneeka Scarboro/Extender: in Treatment: 0 Encounter Discharge Information Items Discharge Condition: Stable Ambulatory Status: Cane Discharge Destination: Home Transportation: Private Auto Accompanied By: self Schedule Follow-up Appointment: Yes Clinical Summary of Care: Patient Declined Electronic Signature(s) Signed: 11/13/2020 4:50:32 PM By: RN Entered By: on 11/13/2020 08:11:14 -------------------------------------------------------------------------------- Lower Extremity Assessment Details Patient Name: Date of Service: , Dale Key 11/13/2020 7:30 A M Medical Record Number: Patient Account Number: Date of Birth/Sex: Treating RN: 06-Feb-1955 (65 y.o. Male) 02/01/2021 Primary Care Dyana Magner: Fonnie Mu Other Clinician: Referring Oree Hislop: Treating Canden Cieslinski/Extender: Fonnie Mu in Treatment: 0 Electronic Signature(s) Signed: 11/13/2020 4:50:32 PM By: Margaretmary Bayley RN Entered By: 13/02/2020 on 11/13/2020 08:07:37 -------------------------------------------------------------------------------- Multi-Disciplinary Care Plan Details Patient Name: Date of Service: Dale Key, Dale Key 11/13/2020 7:30 A M Medical Record Number: ([id number] Patient Account Number: Fonnie Mu Date of Birth/Sex: Treating  RN: [birth year] (65 y.o. Male) 13/02/2020 Primary Care Monta Police: Fonnie Mu Other Clinician: Referring Moiz Ryant: Treating Gearald Stonebraker/Extender: Fonnie Mu in Treatment: 0 Active Inactive Electronic Signature(s) Signed: 11/13/2020 4:50:32 PM By: Worthy Keeler RN Entered By: 13/02/2020 on 11/13/2020 08:08:39 -------------------------------------------------------------------------------- Pain Assessment Details Patient Name: Date of Service: Dale Key, Dale Key 11/13/2020 7:30 A M Medical Record Number: [id number] Patient Account Number: Fonnie Mu Date of Birth/Sex: Treating RN: [birth year] (65 y.o. Male) 13/02/2020 Primary Care Saree Krogh: Fonnie Mu Other Clinician: Referring Yolanda Dockendorf: Treating Taijon Vink/Extender: Fonnie Mu in Treatment: 0 Active Problems Location of Pain Severity  and Description of Pain Patient Has Paino No Site Locations Pain Management and Medication Current Pain Management: Electronic Signature(s) Signed: 11/13/2020 4:50:32 PM By: Fonnie Mu RN Entered By: Fonnie Mu on 11/13/2020 08:07:47 -------------------------------------------------------------------------------- Patient/Caregiver Education Details Patient Name: Date of Service: Dale Key, Dale Key 11/2/2022andnbsp7:30 A M Medical Record Number: [id number] Patient Account Number: Dale Key Date of Birth/Gender: Treating RN: 1955-07-26 (65 y.o. Male) Fonnie Mu Primary Care Physician: Barbette Reichmann Other Clinician: Referring Physician: Treating Physician/Extender: Valentino Saxon in Treatment: 0 Education Assessment Education Provided To: Patient Education Topics Provided Wound/Skin Impairment: Methods: Explain/Verbal Responses: State content correctly Electronic Signature(s) Signed: 11/13/2020 4:50:32 PM By: Fonnie Mu RN Entered By: Fonnie Mu on 11/13/2020 08:08:07

## 2020-11-13 NOTE — Progress Notes (Signed)
Dale, Key ([id number]) Visit Report for 11/13/2020 Abuse/Suicide Risk Screen Details Patient Name: Date of Service: Dale Key, Dale Key 11/13/2020 7:30 A M Medical Record Number: [id number] Patient Account Number: [id number] Date of Birth/Sex: Treating RN: 12-Dec-1955 (65 y.o. Male) Fonnie Mu Primary Care Sheala Dosh: Barbette Reichmann Other Clinician: Referring Nema Oatley: Treating Bryten Maher/Extender: Valentino Saxon in Treatment: 0 Abuse/Suicide Risk Screen Items Answer ABUSE RISK SCREEN: Has anyone close to you tried to hurt or harm you recentlyo No Do you feel uncomfortable with anyone in your familyo No Has anyone forced you do things that you didnt want to doo No Electronic Signature(s) Signed: 11/13/2020 4:50:32 PM By: Fonnie Mu RN Entered By: Fonnie Mu on 11/13/2020 08:06:15 -------------------------------------------------------------------------------- Activities of Daily Living Details Patient Name: Date of Service: Dale Key, Dale Key 11/13/2020 7:30 A M Medical Record Number: [id number] Patient Account Number: [id number] Date of Birth/Sex: Treating RN: 03/14/1955 (65 y.o. Male) Fonnie Mu Primary Care Emeric Novinger: Barbette Reichmann Other Clinician: Referring Florean Hoobler: Treating Hinata Diener/Extender: Valentino Saxon in Treatment: 0 Activities of Daily Living Items Answer Activities of Daily Living (Please select one for each item) Drive Automobile Completely Able T Medications ake Completely Able Use T elephone Completely Able Care for Appearance Completely Able Use T oilet Completely Able Bath / Shower Completely Able Dress Self Completely Able Feed Self Completely Able Walk Completely Able Get In / Out Bed Completely Able Housework Completely Able Prepare Meals Completely Able Handle Money Completely Able Shop for Self Completely Able Electronic Signature(s) Signed: 11/13/2020 4:50:32 PM By: Fonnie Mu RN Entered By:  Fonnie Mu on 11/13/2020 08:06:33 -------------------------------------------------------------------------------- Education Screening Details Patient Name: Date of Service: Dale Key, Dale Key 11/13/2020 7:30 A M Medical Record Number: [id number] Patient Account Number: [id number] Date of Birth/Sex: Treating RN: [birth year] (65 y.o. Male) Fonnie Mu Primary Care Lovinia Snare: Barbette Reichmann Other Clinician: Referring Pegeen Stiger: Treating Jaterrius Ricketson/Extender: Valentino Saxon in Treatment: 0 Primary Learner Assessed: Patient Learning Preferences/Education Level/Primary Language Learning Preference: Explanation, Demonstration, Communication Board, Printed Material Highest Education Level: College or Above Preferred Language: English Cognitive Barrier Language Barrier: No Translator Needed: No Memory Deficit: No Emotional Barrier: No Cultural/Religious Beliefs Affecting Medical Care: No Physical Barrier Impaired Vision: No Impaired Hearing: No Decreased Hand dexterity: No Knowledge/Comprehension Knowledge Level: High Comprehension Level: High Ability to understand written instructions: High Ability to understand verbal instructions: High Motivation Anxiety Level: Calm Cooperation: Cooperative Education Importance: Denies Need Interest in Health Problems: Asks Questions Perception: Coherent Willingness to Engage in Self-Management High Activities: Readiness to Engage in Self-Management High Activities: Electronic Signature(s) Signed: 11/13/2020 4:50:32 PM By: Fonnie Mu RN Entered By: Fonnie Mu on 11/13/2020 08:07:08 -------------------------------------------------------------------------------- Fall Risk Assessment Details Patient Name: Date of Service: Dale Key, Dale Key 11/13/2020 7:30 A M Medical Record Number: [id number] Patient Account Number: [id number] Date of Birth/Sex: Treating RN: [birth year] (65 y.o. Male) Fonnie Mu Primary  Care Edina Winningham: Barbette Reichmann Other Clinician: Referring Lujain Kraszewski: Treating Shanayah Kaffenberger/Extender: Valentino Saxon in Treatment: 0 Fall Risk Assessment Items Have you had 2 or more falls in the last 12 monthso 0 Yes Have you had any fall that resulted in injury in the last 12 monthso 0 Yes FALLS RISK SCREEN History of falling - immediate or within 3 months 0 No Secondary diagnosis (Do you have 2 or more medical diagnoseso) 0 No Ambulatory aid None/bed rest/wheelchair/nurse 0 No Crutches/cane/walker 0 No Furniture 0 No Intravenous therapy Access/Saline/Heparin Lock 0 No Gait/Transferring Normal/ bed rest/ wheelchair 0 No Weak (short steps with or without shuffle, stooped but  able to lift head while walking, may seek 0 No support from furniture) Impaired (short steps with shuffle, may have difficulty arising from chair, head down, impaired 0 No balance) Mental Status Oriented to own ability 0 No Electronic Signature(s) Signed: 11/13/2020 4:50:32 PM By: Fonnie Mu RN Entered By: Fonnie Mu on 11/13/2020 08:07:17 -------------------------------------------------------------------------------- Foot Assessment Details Patient Name: Date of Service: Dale Key, Dale Key 11/13/2020 7:30 A M Medical Record Number: [id number] Patient Account Number: [id number] Date of Birth/Sex: Treating RN: [birth year] (65 y.o. Male) Fonnie Mu Primary Care Briseyda Fehr: Barbette Reichmann Other Clinician: Referring Erion Weightman: Treating Dwane Andres/Extender: Valentino Saxon in Treatment: 0 Foot Assessment Items Site Locations + = Sensation present, - = Sensation absent, C = Callus, U = Ulcer R = Redness, W = Warmth, M = Maceration, PU = Pre-ulcerative lesion F = Fissure, S = Swelling, D = Dryness Assessment Right: Left: Other Deformity: No No Prior Foot Ulcer: No No Prior Amputation: No No Charcot Joint: No No Ambulatory Status: Gait: Electronic Signature(s) Signed:  11/13/2020 4:50:32 PM By: Fonnie Mu RN Entered By: Fonnie Mu on 11/13/2020 08:07:30 -------------------------------------------------------------------------------- Nutrition Risk Screening Details Patient Name: Date of Service: Dale Key, Dale Key 11/13/2020 7:30 A M Medical Record Number: [id number] Patient Account Number: [id number] Date of Birth/Sex: Treating RN: 11/01/1955 (65 y.o. Male) Fonnie Mu Primary Care Sharone Picchi: Barbette Reichmann Other Clinician: Referring Seamus Warehime: Treating Georgios Kina/Extender: Valentino Saxon in Treatment: 0 Height (in): 69 Weight (lbs): 255 Body Mass Index (BMI): 37.7 Nutrition Risk Screening Items Score Screening NUTRITION RISK SCREEN: I have an illness or condition that made me change the kind and/or amount of food I eat 0 No I eat fewer than two meals per day 0 No I eat few fruits and vegetables, or milk products 0 No I have three or more drinks of beer, liquor or wine almost every day 0 No I have tooth or mouth problems that make it hard for me to eat 0 No I don't always have enough money to buy the food I need 0 No I eat alone most of the time 0 No I take three or more different prescribed or over-the-counter drugs a day 0 No Without wanting to, I have lost or gained 10 pounds in the last six months 0 No I am not always physically able to shop, cook and/or feed myself 0 No Nutrition Protocols Good Risk Protocol 0 No interventions needed Moderate Risk Protocol High Risk Proctocol Risk Level: Good Risk Score: 0 Electronic Signature(s) Signed: 11/13/2020 4:50:32 PM By: Fonnie Mu RN Entered By: Fonnie Mu on 11/13/2020 08:07:22

## 2020-11-14 NOTE — Progress Notes (Addendum)
GREIG, KROTH ([id number]) Visit Report for 11/13/2020 Chief Complaint Document Details Patient Name: Date of Service: Dale Key, Dale Key 11/13/2020 7:30 A M Medical Record Number: [id number] Patient Account Number: [id number] Date of Birth/Sex: Treating RN: [birth year] (65 y.o. Male) Zandra Abts Primary Care Provider: Barbette Reichmann Other Clinician: Referring Provider: Treating Provider/Extender: Valentino Saxon in Treatment: 0 Information Obtained from: Patient Chief Complaint patient is here for review of a wound on his left lateral calf 11/13/2020; patient is here for review of the abrasion he suffered after a fall on his upper forehead Electronic Signature(s) Signed: 11/14/2020 5:02:06 PM By: Baltazar Najjar MD Entered By: Baltazar Najjar on 11/13/2020 08:25:42 -------------------------------------------------------------------------------- HPI Details Patient Name: Date of Service: Dale Key, Dale Key 11/13/2020 7:30 A M Medical Record Number: [id number] Patient Account Number: [id number] Date of Birth/Sex: Treating RN: 1955/12/27 (65 y.o. Male) Zandra Abts Primary Care Provider: Barbette Reichmann Other Clinician: Referring Provider: Treating Provider/Extender: Valentino Saxon in Treatment: 0 History of Present Illness HPI Description: 03/15/17; this is a 65 year old man who is a type II diabetic on oral agents. On 2/27 he presented to the ED with a wound on his left lower extremity. He had a fair amount of lower extremity edema. The patient had noticed a blister on the left lateral leg which he thinks may have come from having his leg to close to a heating duct at [facility] Second-degree burn. The emergency room did not feel that this was a burn injury but this was simply a blister secondary to excessive edema. They gave him cephalexin. Patient also states that in the last 2 weeks he has had a TURP and he has had lessening of his lower extremity edema since then. He has been  using antibiotic ointment and T over the wound area. He has no history of wound related issues in his lower elfa extremities. Past medical history includes BPH with obstruction recent TURP. Vocal cord paralysis, status post tracheostomy reversed, type 2 diabetes on oral agents hypertension and hyperlipidemia anterior cervical fusion. ABIs in this clinic were0.98 on the right and 1.0 on the left 03/22/17; this is a patient we admitted to clinic last week he has a large wound on the anterior surface of his left lateral leg. Fairly extensive debridement last week which she says he tolerated very poorly afterwards due to pain. We put silver alginate on this last week. 03/29/17 He is here in follow up evaluation for LLE ulcer. He is refusing debridement today. We will continue with iodoflex and follow up next week. He states his blood sugar was 100 this morning 04/05/17; left lower extremity ulcer. He is very reluctant to allow debridement but after some coaxing I was able to get a gentle surface debridement done. His dimensions are closing in however the remaining wound has a very fibrinous adherent debris over the surface. We have been using Iodoflex 04/12/17; left lower leg. Still using Iodoflex. Wound is smaller. Still debridement [place] to be over the surface although I did not debridement this today further debridement is certainly possible. He does not have an arterial issue 04/19/17; left lower leg still using Iodoflex wound continues to get smaller. Using Iodoflex 04/26/17;possible burn injury in the setting of poorly controlled edema on the left lateral leg. This is been gradually epithelializing. Never really managed to get a completely viable surface therefore I've been using Iodoflex with frequent mechanical debridements. 05/03/17; burn injury in the setting of poorly controlled edema left leg. This is generally getting smaller.  Using Iodoflex as the primary dressing 05/13/17; the patient arrives in  clinic with his wound totally healed. He has chronic venous insufficiency and lymphedema. He tells me he has stockings at home which were new that he had previously ordered. I have not seen these and he is not aware of what compression network.When he first came to the clinic there was some thought that this might be a superficial burn injury from a household heat or although he doesn't think this was true he simply thinks the edema control was inadequate Readmission 11/13/2020 This is a now 65 year old man we had in the clinic from 03/15/2017 through 05/13/2017 with wounds on his left calf in the setting of lymphedema. We are able to heal him out and recommended stockings. We felt he had chronic venous insufficiency and lymphedema. He came in the clinic today with no recurrence on his legs however he is not also not wearing stockings. In any case the reason that he came in was apparently 4 to 6 weeks ago he fell asleep while sitting in chair fell out of his chair and suffered an abrasion injury on his upper mid forehead. He has been using peroxide to this ever since and in spite of this this is actually closed over today. It is deep pigmented with scar tissue but is definitely fully epithelialized. Other than this he has had no ongoing wound issue. Past medical history includes type 2 diabetes with peripheral neuropathy, hypertension, multinodular goiter, open angle glaucoma, cervical fusion in 2017 again after a fall. He had vocal cord paralysis and temporary tracheostomy that was removed or came out. He also has obstructive sleep apnea with CPAP Electronic Signature(s) Signed: 11/14/2020 5:02:06 PM By: Baltazar Najjar MD Entered By: Baltazar Najjar on 11/13/2020 08:27:57 -------------------------------------------------------------------------------- Physical Exam Details Patient Name: Date of Service: Dale Key, Dale Key 11/13/2020 7:30 A M Medical Record Number: [id number] Patient Account Number:  [id number] Date of Birth/Sex: Treating RN: 12-02-1955 (65 y.o. Male) Zandra Abts Primary Care Provider: Barbette Reichmann Other Clinician: Referring Provider: Treating Provider/Extender: Valentino Saxon in Treatment: 0 Respiratory work of breathing is normal. Bilateral breath sounds are clear and equal in all lobes with no wheezes, rales or rhonchi.. Cardiovascular Heart rhythm and rate regular, without murmur or gallop.. Edema present in both extremities. Nonpitting. Very dry flaking skin. Notes Wound exam; upper mid forehead. Fully epithelialized wound. He has lost some pigment here which is probably scar tissue. He has been using peroxide for 4 to 6 weeks might have something to do with this. In any case he has no open wound. Electronic Signature(s) Signed: 11/14/2020 5:02:06 PM By: Baltazar Najjar MD Entered By: Baltazar Najjar on 11/13/2020 08:29:20 -------------------------------------------------------------------------------- Physician Orders Details Patient Name: Date of Service: Dale Key, Dale Key 11/13/2020 7:30 A M Medical Record Number: [id number] Patient Account Number: [id number] Date of Birth/Sex: Treating RN: 10/24/1955 (65 y.o. Male) Fonnie Mu Primary Care Provider: Barbette Reichmann Other Clinician: Referring Provider: Treating Provider/Extender: Valentino Saxon in Treatment: 0 Verbal / Phone Orders: No Diagnosis Coding Discharge From [facility] Services Discharge from Wound Care Center Electronic Signature(s) Signed: 11/13/2020 4:50:32 PM By: Fonnie Mu RN Signed: 11/14/2020 5:02:06 PM By: Baltazar Najjar MD Entered By: Fonnie Mu on 11/13/2020 08:09:15 -------------------------------------------------------------------------------- Problem List Details Patient Name: Date of Service: Dale Key, Dale Key 11/13/2020 7:30 A M Medical Record Number: [id number] Patient Account Number: [id number] Date of Birth/Sex: Treating RN: 03-Nov-1955 (65 y.o.  Male) Zandra Abts Primary Care Provider: Barbette Reichmann Other Clinician: Referring  Provider: Treating Provider/Extender: Valentino Saxon in Treatment: 0 Active Problems ICD-10 Encounter Code Description Active Date MDM Diagnosis S00.91XA Abrasion of unspecified part of head, initial encounter 11/13/2020 No Yes R26.9 Unspecified abnormalities of gait and mobility 11/13/2020 No Yes Inactive Problems Resolved Problems Electronic Signature(s) Signed: 11/14/2020 5:02:06 PM By: Baltazar Najjar MD Entered By: Baltazar Najjar on 11/13/2020 08:25:10 -------------------------------------------------------------------------------- Progress Note Details Patient Name: Date of Service: Dale Key, Dale Key 11/13/2020 7:30 A M Medical Record Number: [id number] Patient Account Number: [id number] Date of Birth/Sex: Treating RN: [birth year] (65 y.o. Male) Zandra Abts Primary Care Provider: Barbette Reichmann Other Clinician: Referring Provider: Treating Provider/Extender: Valentino Saxon in Treatment: 0 Subjective Chief Complaint Information obtained from Patient patient is here for review of a wound on his left lateral calf 11/13/2020; patient is here for review of the abrasion he suffered after a fall on his upper forehead History of Present Illness (HPI) 03/15/17; this is a 65 year old man who is a type II diabetic on oral agents. On 2/27 he presented to the ED with a wound on his left lower extremity. He had a fair amount of lower extremity edema. The patient had noticed a blister on the left lateral leg which he thinks may have come from having his leg to close to a heating duct at [facility] Second-degree burn. The emergency room did not feel that this was a burn injury but this was simply a blister secondary to excessive edema. They gave him cephalexin. Patient also states that in the last 2 weeks he has had a TURP and he has had lessening of his lower extremity edema since then. He has  been using antibiotic ointment and T over the wound area. He has no history of wound related issues in his lower extremities. elfa Past medical history includes BPH with obstruction recent TURP. Vocal cord paralysis, status post tracheostomy reversed, type 2 diabetes on oral agents hypertension and hyperlipidemia anterior cervical fusion. ABIs in this clinic were0.98 on the right and 1.0 on the left 03/22/17; this is a patient we admitted to clinic last week he has a large wound on the anterior surface of his left lateral leg. Fairly extensive debridement last week which she says he tolerated very poorly afterwards due to pain. We put silver alginate on this last week. 03/29/17 He is here in follow up evaluation for LLE ulcer. He is refusing debridement today. We will continue with iodoflex and follow up next week. He states his blood sugar was 100 this morning 04/05/17; left lower extremity ulcer. He is very reluctant to allow debridement but after some coaxing I was able to get a gentle surface debridement done. His dimensions are closing in however the remaining wound has a very fibrinous adherent debris over the surface. We have been using Iodoflex 04/12/17; left lower leg. Still using Iodoflex. Wound is smaller. Still debridement [place] to be over the surface although I did not debridement this today further debridement is certainly possible. He does not have an arterial issue 04/19/17; left lower leg still using Iodoflex wound continues to get smaller. Using Iodoflex 04/26/17;possible burn injury in the setting of poorly controlled edema on the left lateral leg. This is been gradually epithelializing. Never really managed to get a completely viable surface therefore I've been using Iodoflex with frequent mechanical debridements. 05/03/17; burn injury in the setting of poorly controlled edema left leg. This is generally getting smaller. Using Iodoflex as the primary dressing 05/13/17; the patient arrives in  clinic with  his wound totally healed. He has chronic venous insufficiency and lymphedema. He tells me he has stockings at home which were new that he had previously ordered. I have not seen these and he is not aware of what compression network.When he first came to the clinic there was some thought that this might be a superficial burn injury from a household heat or although he doesn't think this was true he simply thinks the edema control was inadequate Readmission 11/13/2020 This is a now 65 year old man we had in the clinic from 03/15/2017 through 05/13/2017 with wounds on his left calf in the setting of lymphedema. We are able to heal him out and recommended stockings. We felt he had chronic venous insufficiency and lymphedema. He came in the clinic today with no recurrence on his legs however he is not also not wearing stockings. In any case the reason that he came in was apparently 4 to 6 weeks ago he fell asleep while sitting in chair fell out of his chair and suffered an abrasion injury on his upper mid forehead. He has been using peroxide to this ever since and in spite of this this is actually closed over today. It is deep pigmented with scar tissue but is definitely fully epithelialized. Other than this he has had no ongoing wound issue. Past medical history includes type 2 diabetes with peripheral neuropathy, hypertension, multinodular goiter, open angle glaucoma, cervical fusion in 2017 again after a fall. He had vocal cord paralysis and temporary tracheostomy that was removed or came out. He also has obstructive sleep apnea with CPAP Patient History Information obtained from Patient. Allergies No Known Drug Allergies Family History Diabetes - Mother,Father, Heart Disease - Father,Siblings, Hypertension - Mother,Father,Siblings, Seizures - Siblings, Stroke - Father, No family history of Cancer, Hereditary Spherocytosis, Kidney Disease, Lung Disease, Thyroid Problems. Social  History Former smoker - quit 35 years ago, Marital Status - Single, Alcohol Use - Rarely, Drug Use - Prior History - high school, Caffeine Use - Moderate - coffee 2-3 times a week. Medical History Hematologic/Lymphatic Patient has history of Lymphedema Respiratory Patient has history of Aspiration, Sleep Apnea - not treated Cardiovascular Patient has history of Hypertension Endocrine Patient has history of Type II Diabetes Integumentary (Skin) Denies history of History of Burn Musculoskeletal Patient has history of Osteoarthritis Medical A Surgical History Notes nd Respiratory had trach until 6-8 weeks ago Endocrine enlarged thyroid Genitourinary issues with stream start/stop due to BPH Neurologic neuropathy suspected - not confirmed Review of Systems (ROS) Constitutional Symptoms (General Health) Denies complaints or symptoms of Fatigue, Fever, Chills, Marked Weight Change. Eyes Denies complaints or symptoms of Dry Eyes, Vision Changes, Glasses / Contacts. Ear/Nose/Mouth/Throat Denies complaints or symptoms of Chronic sinus problems or rhinitis. Respiratory Denies complaints or symptoms of Chronic or frequent coughs, Shortness of Breath. Cardiovascular Denies complaints or symptoms of Chest pain. Gastrointestinal Denies complaints or symptoms of Frequent diarrhea, Nausea, Vomiting. Endocrine Denies complaints or symptoms of Heat/cold intolerance. Genitourinary Denies complaints or symptoms of Frequent urination. Musculoskeletal Denies complaints or symptoms of Muscle Pain, Muscle Weakness. Neurologic Denies complaints or symptoms of Numbness/parasthesias. Psychiatric Denies complaints or symptoms of Claustrophobia, Suicidal. Objective Respiratory work of breathing is normal. Bilateral breath sounds are clear and equal in all lobes with no wheezes, rales or rhonchi.. Cardiovascular Heart rhythm and rate regular, without murmur or gallop.. Edema present in both  extremities. Nonpitting. Very dry flaking skin. General Notes: Wound exam; upper mid forehead. Fully epithelialized wound. He has lost some  pigment here which is probably scar tissue. He has been using peroxide for 4 to 6 weeks might have something to do with this. In any case he has no open wound. Assessment Active Problems ICD-10 Abrasion of unspecified part of head, initial encounter Unspecified abnormalities of gait and mobility Plan Discharge From [facility] Services: Discharge from W[facility] Care Center #1 the patient does not need to be followed here 2. I advised him that his pigment is likely to return but it may take several months. What I what I am looking at I think is scar tissue 3. He has uncontrolled lymphedema Berry damage skin in his lower extremities I advised skin lubrication and compression stockings. He says he is going to "get back into that] Electronic Signature(s) Signed: 11/14/2020 5:02:06 PM By: Baltazar Najjar MD Entered By: Baltazar Najjar on 11/13/2020 08:30:24 -------------------------------------------------------------------------------- HxROS Details Patient Name: Date of Service: Dale Key, Dale Key 11/13/2020 7:30 A M Medical Record Number: [id number] Patient Account Number: [id number] Date of Birth/Sex: Treating RN: 1955-02-07 (65 y.o. Male) Fonnie Mu Primary Care Provider: Barbette Reichmann Other Clinician: Referring Provider: Treating Provider/Extender: Valentino Saxon in Treatment: 0 Information Obtained From Patient Constitutional Symptoms (General Health) Complaints and Symptoms: Negative for: Fatigue; Fever; Chills; Marked Weight Change Eyes Complaints and Symptoms: Negative for: Dry Eyes; Vision Changes; Glasses / Contacts Ear/Nose/Mouth/Throat Complaints and Symptoms: Negative for: Chronic sinus problems or rhinitis Respiratory Complaints and Symptoms: Negative for: Chronic or frequent coughs; Shortness of Breath Medical  History: Positive for: Aspiration; Sleep Apnea - not treated Past Medical History Notes: had trach until 6-8 weeks ago Cardiovascular Complaints and Symptoms: Negative for: Chest pain Medical History: Positive for: Hypertension Gastrointestinal Complaints and Symptoms: Negative for: Frequent diarrhea; Nausea; Vomiting Endocrine Complaints and Symptoms: Negative for: Heat/cold intolerance Medical History: Positive for: Type II Diabetes Past Medical History Notes: enlarged thyroid Time with diabetes: 11 years Treated with: Oral agents Blood sugar tested every day: Yes Tested : twice daily Genitourinary Complaints and Symptoms: Negative for: Frequent urination Medical History: Past Medical History Notes: issues with stream start/stop due to BPH Musculoskeletal Complaints and Symptoms: Negative for: Muscle Pain; Muscle Weakness Medical History: Positive for: Osteoarthritis Neurologic Complaints and Symptoms: Negative for: Numbness/parasthesias Medical History: Past Medical History Notes: neuropathy suspected - not confirmed Psychiatric Complaints and Symptoms: Negative for: Claustrophobia; Suicidal Hematologic/Lymphatic Medical History: Positive for: Lymphedema Immunological Integumentary (Skin) Medical History: Negative for: History of Burn Oncologic Immunizations Pneumococcal Vaccine: Received Pneumococcal Vaccination: No Tetanus Vaccine: Last tetanus shot: 10/03/2012 Implantable Devices None Family and Social History Cancer: No; Diabetes: Yes - Mother,Father; Heart Disease: Yes - Father,Siblings; Hereditary Spherocytosis: No; Hypertension: Yes - Mother,Father,Siblings; Kidney Disease: No; Lung Disease: No; Seizures: Yes - Siblings; Stroke: Yes - Father; Thyroid Problems: No; Former smoker - quit 35 years ago; Marital Status - Single; Alcohol Use: Rarely; Drug Use: Prior History - high school; Caffeine Use: Moderate - coffee 2-3 times a week; Financial  Concerns: No; Food, Clothing or Shelter Needs: No; Support System Lacking: No; Transportation Concerns: No Electronic Signature(s) Signed: 11/13/2020 4:50:32 PM By: Fonnie Mu RN Signed: 11/14/2020 5:02:06 PM By: Baltazar Najjar MD Entered By: Fonnie Mu on 11/13/2020 08:06:09 -------------------------------------------------------------------------------- SuperBill Details Patient Name: Date of Service: Dale Key, Dale Key 11/13/2020 Medical Record Number: [id number] Patient Account Number: [id number] Date of Birth/Sex: Treating RN: 01/24/1955 (65 y.o. Male) Zandra Abts Primary Care Provider: Barbette Reichmann Other Clinician: Referring Provider: Treating Provider/Extender: Valentino Saxon in Treatment: 0 Diagnosis Coding ICD-10 Codes Code Description S00.91XA Abrasion  of unspecified part of head, initial encounter R26.9 Unspecified abnormalities of gait and mobility Facility Procedures CPT4 Code: [id number] Description: [id number] - WOUND CARE VISIT-LEV 2 EST PT Modifier: Quantity: 1 Physician Procedures : CPT4 Code Description Modifier [id number] - WC PHYS LEVEL 2 - NEW PT ICD-10 Diagnosis Description S00.91XA Abrasion of unspecified part of head, initial encounter R26.9 Unspecified abnormalities of gait and mobility Quantity: 1 Electronic Signature(s) Signed: 12/11/2020 4:53:46 PM By: Baltazar Najjar MD Signed: 01/30/2021 12:38:30 PM By: Fonnie Mu RN Previous Signature: 11/14/2020 5:02:06 PM Version By: Baltazar Najjar MD Entered By: Fonnie Mu on 12/03/2020 13:49:06

## 2021-02-03 ENCOUNTER — Ambulatory Visit (INDEPENDENT_AMBULATORY_CARE_PROVIDER_SITE_OTHER): Payer: Self-pay | Admitting: Podiatry

## 2021-02-03 DIAGNOSIS — Z91199 Patient's noncompliance with other medical treatment and regimen due to unspecified reason: Secondary | ICD-10-CM

## 2021-02-06 NOTE — Progress Notes (Signed)
   Complete physical exam  Patient: Dale Key   DOB: 11/01/1998   66 y.o. Male  MRN: [id number]  Subjective:    No chief complaint on file.   Dale Key is a 66 y.o. male who presents today for a complete physical exam. She reports consuming a {diet types:17450} diet. {types:19826} She generally feels {DESC; WELL/FAIRLY WELL/POORLY:18703}. She reports sleeping {DESC; WELL/FAIRLY WELL/POORLY:18703}. She {does/does not:200015} have additional problems to discuss today.    Most recent fall risk assessment:    07/09/2021   10:42 AM  Fall Risk   Falls in the past year? 0  Number falls in past yr: 0  Injury with Fall? 0  Risk for fall due to : No Fall Risks  Follow up Falls evaluation completed     Most recent depression screenings:    07/09/2021   10:42 AM 05/30/2020   10:46 AM  PHQ 2/9 Scores  PHQ - 2 Score 0 0  PHQ- 9 Score 5     {VISON DENTAL STD PSA (Optional):27386}  {History (Optional):23778}  Patient Care Team: Jessup, Joy, NP as PCP - General (Nurse Practitioner)   Outpatient Medications Prior to Visit  Medication Sig   fluticasone (FLONASE) 50 MCG/ACT nasal spray Place 2 sprays into both nostrils in the morning and at bedtime. After 7 days, reduce to once daily.   norgestimate-ethinyl estradiol (SPRINTEC 28) 0.25-35 MG-MCG tablet Take 1 tablet by mouth daily.   Nystatin POWD Apply liberally to affected area 2 times per day   spironolactone (ALDACTONE) 100 MG tablet Take 1 tablet (100 mg total) by mouth daily.   No facility-administered medications prior to visit.    ROS        Objective:     There were no vitals taken for this visit. {Vitals History (Optional):23777}  Physical Exam   No results found for any visits on 08/14/21. {Show previous labs (optional):23779}    Assessment & Plan:    Routine Health Maintenance and Physical Exam  Immunization History  Administered Date(s) Administered   DTaP 01/15/1999, 03/13/1999,  05/22/1999, 02/05/2000, 08/21/2003   Hepatitis A 06/17/2007, 06/22/2008   Hepatitis B 11/02/1998, 12/10/1998, 05/22/1999   HiB (PRP-OMP) 01/15/1999, 03/13/1999, 05/22/1999, 02/05/2000   IPV 01/15/1999, 03/13/1999, 11/10/1999, 08/21/2003   Influenza,inj,Quad PF,6+ Mos 09/22/2013   Influenza-Unspecified 12/23/2011   MMR 11/09/2000, 08/21/2003   Meningococcal Polysaccharide 06/22/2011   Pneumococcal Conjugate-13 02/05/2000   Pneumococcal-Unspecified 05/22/1999, 08/05/1999   Tdap 06/22/2011   Varicella 11/10/1999, 06/17/2007    Health Maintenance  Topic Date Due   HIV Screening  Never done   Hepatitis C Screening  Never done   INFLUENZA VACCINE  08/12/2021   PAP-Cervical Cytology Screening  08/14/2021 (Originally 11/01/2019)   PAP SMEAR-Modifier  08/14/2021 (Originally 11/01/2019)   TETANUS/TDAP  08/14/2021 (Originally 06/21/2021)   HPV VACCINES  Discontinued   COVID-19 Vaccine  Discontinued    Discussed health benefits of physical activity, and encouraged her to engage in regular exercise appropriate for her age and condition.  Problem List Items Addressed This Visit   None Visit Diagnoses     Annual physical exam    -  Primary   Cervical cancer screening       Need for Tdap vaccination          No follow-ups on file.     Joy Jessup, NP   

## 2021-02-17 ENCOUNTER — Other Ambulatory Visit: Payer: Self-pay | Admitting: Internal Medicine

## 2021-02-17 DIAGNOSIS — R600 Localized edema: Secondary | ICD-10-CM

## 2021-02-18 ENCOUNTER — Encounter: Payer: Self-pay | Admitting: Podiatry

## 2021-02-18 ENCOUNTER — Other Ambulatory Visit: Payer: Self-pay

## 2021-02-18 ENCOUNTER — Ambulatory Visit
Admission: RE | Admit: 2021-02-18 | Discharge: 2021-02-18 | Disposition: A | Discharge status: Home / Self Care | Payer: Medicare Other | Source: Ambulatory Visit | Attending: Internal Medicine | Admitting: Internal Medicine

## 2021-02-18 ENCOUNTER — Ambulatory Visit (INDEPENDENT_AMBULATORY_CARE_PROVIDER_SITE_OTHER): Payer: Medicare Other | Admitting: Podiatry

## 2021-02-18 DIAGNOSIS — R600 Localized edema: Secondary | ICD-10-CM | POA: Insufficient documentation

## 2021-02-18 DIAGNOSIS — B351 Tinea unguium: Secondary | ICD-10-CM | POA: Diagnosis not present

## 2021-02-18 DIAGNOSIS — E1151 Type 2 diabetes mellitus with diabetic peripheral angiopathy without gangrene: Secondary | ICD-10-CM

## 2021-02-18 DIAGNOSIS — M79675 Pain in left toe(s): Secondary | ICD-10-CM | POA: Diagnosis not present

## 2021-02-18 DIAGNOSIS — M79674 Pain in right toe(s): Secondary | ICD-10-CM | POA: Diagnosis not present

## 2021-02-18 NOTE — Progress Notes (Signed)
This patient returns to my office for at risk foot care.  This patient requires this care by a professional since this patient will be at risk due to having diabetes.  This patient is unable to cut nails himself since the patient cannot reach his nails.These nails are painful walking and wearing shoes.  This patient presents for at risk foot care today.  General Appearance  Alert, conversant and in no acute stress.  Vascular  Dorsalis pedis and posterior tibial  pulses are palpable  bilaterally.  Capillary return is within normal limits  bilaterally. Temperature is within normal limits  bilaterally.  Neurologic  Senn-Weinstein monofilament wire test within normal limits  bilaterally. Muscle power within normal limits bilaterally.  Nails Thick disfigured discolored nails with subungual debris  from hallux to fifth toes bilaterally. No evidence of bacterial infection or drainage bilaterally.  Orthopedic  No limitations of motion  feet .  No crepitus or effusions noted.  No bony pathology or digital deformities noted.  Skin  dry skin with no porokeratosis noted bilaterally.  No signs of infections or ulcers noted.     Onychomycosis  Pain in right toes  Pain in left toes  Consent was obtained for treatment procedures.   Mechanical debridement of nails 1-5  bilaterally performed with a nail nipper.  Filed with dremel without incident.    Return office visit   3 months                   Told patient to return for periodic foot care and evaluation due to potential at risk complications.   Gardiner Barefoot DPM

## 2021-02-19 ENCOUNTER — Ambulatory Visit: Payer: Medicare Other

## 2021-05-20 ENCOUNTER — Ambulatory Visit (INDEPENDENT_AMBULATORY_CARE_PROVIDER_SITE_OTHER): Payer: Medicare Other | Admitting: Podiatry

## 2021-05-20 ENCOUNTER — Encounter: Payer: Self-pay | Admitting: Podiatry

## 2021-05-20 DIAGNOSIS — M2041 Other hammer toe(s) (acquired), right foot: Secondary | ICD-10-CM | POA: Diagnosis not present

## 2021-05-20 DIAGNOSIS — M79674 Pain in right toe(s): Secondary | ICD-10-CM | POA: Diagnosis not present

## 2021-05-20 DIAGNOSIS — B351 Tinea unguium: Secondary | ICD-10-CM | POA: Diagnosis not present

## 2021-05-20 DIAGNOSIS — E1151 Type 2 diabetes mellitus with diabetic peripheral angiopathy without gangrene: Secondary | ICD-10-CM | POA: Diagnosis not present

## 2021-05-20 DIAGNOSIS — M79675 Pain in left toe(s): Secondary | ICD-10-CM

## 2021-05-20 DIAGNOSIS — M2042 Other hammer toe(s) (acquired), left foot: Secondary | ICD-10-CM

## 2021-05-20 DIAGNOSIS — L84 Corns and callosities: Secondary | ICD-10-CM

## 2021-05-20 DIAGNOSIS — E119 Type 2 diabetes mellitus without complications: Secondary | ICD-10-CM | POA: Diagnosis not present

## 2021-05-29 NOTE — Progress Notes (Signed)
ANNUAL DIABETIC FOOT EXAM  Subjective: Dale Key presents today for annual diabetic foot examination.  Patient relates dx of h/o diabetes.  Patient denies any h/o foot wounds.  Patient denies any numbness, tingling, burning, or pins/needle sensation in feet.  Patient's blood sugar was 170 mg/dl last night. Last known  HgA1c was 6.7%   Risk factors: diabetes, chronic lower extremity edema, HTN, hyperlipidemia, h/o tobacco use in remission.  Dale Harrier, MD is patient's PCP. Last visit was February 17, 2021.  Patient states toe crest did not work for left 3rd digit lesion.  Past Medical History:  Diagnosis Date   Diabetes mellitus without complication ([place])    Glaucoma    Hyperlipidemia    Hypertension    Tracheostomy present [facility])    Patient Active Problem List   Diagnosis Date Noted   Abnormal weight loss 08/05/2020   Chronic idiopathic constipation 08/05/2020   Colon cancer screening 08/05/2020   Gastroesophageal reflux disease 08/05/2020   Low back pain 08/05/2020   Morbid obesity ([place]) 08/05/2020   Personal history of colonic polyps 08/05/2020   Dyspnea on effort 07/30/2020   Thyroid nodule 11/13/2019   Lymphadenopathy 11/06/2019   Glaucoma of right eye 05/11/2019   Moderate nonproliferative diabetic retinopathy 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 edema associated with type 2 diabetes mellitus ([place]) 05/11/2019   Nuclear sclerotic cataract of both eyes 05/11/2019   OSA on CPAP 05/08/2019   Poor mobility 06/24/2018   Urinary tract infection due to ESBL Klebsiella 06/08/2018   Recurrent UTI 05/10/2018   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/10/2018   BMI 40.0-44.9, adult ([place]) 11/25/2017   Cervical disc disease with myelopathy 11/15/2017   Anemia 10/20/2017   Benign essential hypertension 10/20/2017   Constipation 10/20/2017   Controlled type 2 diabetes mellitus with hyperglycemia, without long-term current use of insulin ([place]) 10/20/2017   Generalized  osteoarthritis of multiple sites 10/20/2017   Hoarseness or changing voice 10/20/2017   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   Upper extremity weakness 06/16/2017   Left arm weakness 06/16/2017   S/P cervical spinal fusion 04/29/2017   Bilateral lower extremity edema 01/09/2017   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   Bilateral vocal cord paralysis 12/22/2015   Vocal cord paralysis, unilateral complete 12/22/2015   Oropharyngeal dysphagia 12/02/2015   Low vitamin D level 11/19/2015   Hypertension 11/14/2015   Type 2 diabetes mellitus, without long-term current use of insulin ([place]) 11/14/2015   BPH (benign prostatic hyperplasia) 06/14/2014   Epididymitis, right 05/17/2014   Hematuria 05/17/2014   Swollen testicle 05/17/2014   Testis mass 05/17/2014   Gross hematuria 10/19/2013   Central cord syndrome ([place]) 10/13/2012   Past Surgical History:  Procedure Laterality Date   ANTERIOR CERVICAL DECOMP/DISCECTOMY FUSION N/A 10/07/2012   Procedure: ANTERIOR CERVICAL DECOMPRESSION/DISCECTOMY FUSION 2 LEVELS;  Surgeon: Floyce Stakes, MD;  Location: [place] [facility];  Service: Neurosurgery;  Laterality: N/A;  Anterior Cervical Three-Four/Four-Five Decompression and Fusion   EYE SURGERY     FOOT SURGERY Left    Pt states a tendon was removed to replace one in right hand.   HAND SURGERY Right    TRACHEOSTOMY     Current Outpatient Medications on File Prior to Visit  Medication Sig Dispense Refill   allopurinol (ZYLOPRIM) 300 MG tablet Take by mouth.     finasteride (PROSCAR) 5 MG tablet Take 1 tablet by mouth daily.     Lancets Misc. (ACCU-CHEK SOFTCLIX LANCET DEV) KIT Use 1  each 2 (two) times daily Use as instructed.     losartan (COZAAR) 100 MG tablet Take by mouth.     metFORMIN (GLUCOPHAGE) 500 MG tablet Take by mouth.     Semaglutide,0.25 or 0.5MG/DOS, 2 MG/1.5ML SOPN Inject into the skin.     zinc sulfate 220 (50 Zn) MG capsule Take by mouth.      ACCU-CHEK GUIDE test strip USE 1 STRIP TO CHECK BLOOD GLUCOSE 2 TIMES DAILY AS INSTRUCTED     albuterol (PROAIR HFA) 108 (90 Base) MCG/ACT inhaler INHALE 2 PUFFS BY MOUTH EVERY 6 HOURS AS NEEDED FOR WHEEZING     aspirin 81 MG tablet Take 81 mg by mouth daily.     Blood Glucose Monitoring Suppl (ACCU-CHEK GUIDE) w/Device KIT USE 1 AS DIRECTED     brimonidine (ALPHAGAN) 0.2 % ophthalmic solution Apply to eye.     brimonidine (ALPHAGAN) 0.2 % ophthalmic solution Place 1 drop into the right eye 3 (three) times daily.     cefdinir (OMNICEF) 300 MG capsule TAKE 1 CAPSULE BY MOUTH TWICE DAILY FOR 10 DAYS     cefUROXime (CEFTIN) 250 MG tablet Take 250 mg by mouth 2 (two) times daily.     Cholecalciferol (VITAMIN D-1000 MAX ST) 1000 units tablet Take 1,000 Units by mouth daily.     Coenzyme Q10 10 MG capsule Take by mouth.     cyclobenzaprine (FLEXERIL) 10 MG tablet Take 10 mg by mouth 2 (two) times daily as needed.     dorzolamide-timolol (COSOPT) 22.3-6.8 MG/ML ophthalmic solution Apply to eye.     doxycycline (VIBRA-TABS) 100 MG tablet Take 100 mg by mouth 2 (two) times daily.     FARXIGA 5 MG TABS tablet Take 5 mg by mouth daily.     fluticasone (FLONASE) 50 MCG/ACT nasal spray USE 1 SPRAY(S) IN EACH NOSTRIL TWICE DAILY     fluticasone furoate-vilanterol (BREO ELLIPTA) 100-25 MCG/INH AEPB Inhale into the lungs.     fosfomycin (MONUROL) 3 g PACK Take by mouth.     furosemide (LASIX) 40 MG tablet Take by mouth.     latanoprost (XALATAN) 0.005 % ophthalmic solution 1 drop at bedtime.     linaclotide (LINZESS) 145 MCG CAPS capsule Take 145 mcg by mouth daily.     meloxicam (MOBIC) 15 MG tablet Take 15 mg by mouth daily.     meloxicam (MOBIC) 7.5 MG tablet Take 7.5 mg by mouth daily.     metFORMIN (GLUCOPHAGE) 1000 MG tablet Take by mouth.     metoprolol succinate (TOPROL-XL) 50 MG 24 hr tablet Take 1 tablet (50 mg total) by mouth every morning. Take with or immediately following a meal. 30 tablet 1    Multiple Vitamin (MULTI-VITAMINS) TABS Take by mouth.     nitrofurantoin, macrocrystal-monohydrate, (MACROBID) 100 MG capsule TAKE 1 CAPSULE BY MOUTH TWICE DAILY FOR 5 DAYS     Omega-3 Fatty Acids (OMEGA-3 2100) 1050 MG CAPS Take by mouth.     polyethylene glycol powder (GLYCOLAX/MIRALAX) 17 GM/SCOOP powder Take by mouth.     potassium chloride SA (K-DUR,KLOR-CON) 20 MEQ tablet Take 1 tablet (20 mEq total) by mouth daily as needed (For potassium loss.). 30 tablet 0   predniSONE (DELTASONE) 20 MG tablet 3 tabs po day one, then 2 po daily x 4 days (Patient not taking: Reported on 08/17/2018) 11 tablet 0   rosuvastatin (CRESTOR) 10 MG tablet Take 10 mg by mouth daily.     sulfamethoxazole-trimethoprim (BACTRIM DS,SEPTRA DS) 800-160  MG tablet Take 1 tablet by mouth 2 (two) times daily. for 10 days     tiZANidine (ZANAFLEX) 2 MG tablet Take 2 mg by mouth 3 (three) times daily.     UNABLE TO FIND Take by mouth.     vitamin C (ASCORBIC ACID) 500 MG tablet Take by mouth.     Vitamin D, Ergocalciferol, (DRISDOL) 1.25 MG (50000 UT) CAPS capsule TAKE ONE CAPSULE BY MOUTH ONCE A WEEK. WHEN FINISHED WITH THIS PRESCRIPTION PT SHOULD START TAKING OTC VITAMIN D3 1000 UNITS DAILY FOR 8 DOS     No current facility-administered medications on file prior to visit.    Allergies  Allergen Reactions   Sulfamethoxazole-Trimethoprim Other (See Comments) and Diarrhea   Other Nausea Only   Social History   Occupational History   Not on file  Tobacco Use   Smoking status: Former    Types: Cigarettes    Quit date: 01/13/2012    Years since quitting: 9.3   Smokeless tobacco: Never  Vaping Use   Vaping Use: Never used  Substance and Sexual Activity   Alcohol use: Yes   Drug use: No   Sexual activity: Not on file   History reviewed. No pertinent family history. Immunization History  Administered Date(s) Administered   Influenza,inj,Quad PF,6+ Mos 10/15/2012   Tdap 10/03/2012     Review of Systems: Negative  except as noted in the HPI.   Objective: There were no vitals filed for this visit.  Dale Key is a pleasant 66 y.o. adult in NAD. AAO X 3.  Vascular Examination: CFT <3 seconds b/l LE. Diminished DP pulse(s) b/l LE. Diminished PT pulse(s) b/l LE. Pedal hair absent. No pain with calf compression b/l. Lower extremity skin temperature gradient within normal limits. +2 pitting edema BLE. Evidence of chronic venous insufficiency b/l LE. No ischemia or gangrene noted b/l LE. No cyanosis or clubbing noted b/l LE.  Dermatological Examination: No open wounds b/l LE. No interdigital macerations noted b/l LE. Toenails 1-5 b/l elongated, discolored, dystrophic, thickened, crumbly with subungual debris and tenderness to dorsal palpation. Hyperkeratotic lesion(s) bilateral great toes and 1st metatarsal head b/l lower extremities.  No erythema, no edema, no drainage, no fluctuance. Preulcerative lesion noted L 3rd toe. There is visible subdermal hemorrhage. There is no surrounding erythema, no edema, no drainage, no odor, no fluctuance. Hyperpigmentation consistent with findings of chronic venous insufficiency is present b/l lower extremities. Pedal skin noted to be dry b/l lower extremities.  Neurological Examination: Protective sensation intact 5/5 intact bilaterally with 10g monofilament b/l. Vibratory sensation intact b/l.  Musculoskeletal Examination: Normal muscle strength 5/5 to all lower extremity muscle groups bilaterally. Hammertoe deformity noted 2-5 b/l.Marland Kitchen No pain, crepitus or joint limitation noted with ROM b/l LE.  Patient ambulates independently without assistive aids.  Footwear Assessment: Does the patient wear appropriate shoes? Yes. Does the patient need inserts/orthotics? No.  Assessment: 1. Pain due to onychomycosis of toenails of both feet   2. Pre-ulcerative corn or callous   3. Callus   4. Acquired hammertoes of both feet   5. Type II diabetes mellitus with peripheral  circulatory disorder (HCC)   6. Encounter for diabetic foot exam ([place])     ADA Risk Categorization: High Risk  Patient has one or more of the following: Loss of protective sensation Absent pedal pulses Severe Foot deformity History of foot ulcer  Plan: -Patient was evaluated and treated. All patient's and/or POA's questions/concerns answered on today's visit. -Medicaid ABN signed. Patient  consents for services of paring of corn(s)/callus(es)/porokeratos(es) today. Copy in patient chart. -Diabetic foot examination performed today. -Continue foot and shoe inspections daily. Monitor blood glucose per PCP/Endocrinologist's recommendations. -Mycotic toenails 1-5 bilaterally were debrided in length and girth with sterile nail nippers and dremel without incident. -Callus(es) bilateral great toes and 1st metatarsal head b/l lower extremities pared utilizing sterile scalpel blade without complication or incident. Total number debrided =4. -Preulcerative lesion pared L 3rd toe. Total number pared=1. -Patient/POA to call should there be question/concern in the interim. Return in about 3 months (around 08/20/2021).  Marzetta Board, DPM

## 2021-08-20 ENCOUNTER — Encounter: Payer: Self-pay | Admitting: Podiatry

## 2021-08-20 ENCOUNTER — Ambulatory Visit (INDEPENDENT_AMBULATORY_CARE_PROVIDER_SITE_OTHER): Payer: Medicare Other | Admitting: Podiatry

## 2021-08-20 DIAGNOSIS — L84 Corns and callosities: Secondary | ICD-10-CM | POA: Diagnosis not present

## 2021-08-20 DIAGNOSIS — B351 Tinea unguium: Secondary | ICD-10-CM | POA: Diagnosis not present

## 2021-08-20 DIAGNOSIS — M79675 Pain in left toe(s): Secondary | ICD-10-CM

## 2021-08-20 DIAGNOSIS — M79674 Pain in right toe(s): Secondary | ICD-10-CM | POA: Diagnosis not present

## 2021-08-20 DIAGNOSIS — E1151 Type 2 diabetes mellitus with diabetic peripheral angiopathy without gangrene: Secondary | ICD-10-CM

## 2021-08-20 NOTE — Patient Instructions (Addendum)
Recommend [employer]chers Loafers with stretchable uppers and memory foam insoles. They can be purchased at [employer], [employer] or [employer]. Also on [email or web address].  

## 2021-08-26 NOTE — Progress Notes (Signed)
  Subjective:  Patient ID: Dale Key, adult    DOB: 1955/01/15,  MRN: [id number]  Dale Key presents to clinic today for at risk foot care. Pt has h/o NIDDM with PAD and corn(s) left lower extremity, callus(es) b/l lower extremities and painful mycotic nails.  Pain interferes with ambulation. Aggravating factors include wearing enclosed shoe gear. Painful toenails interfere with ambulation. Aggravating factors include wearing enclosed shoe gear. Pain is relieved with periodic professional debridement. Painful corns and calluses are aggravated when weightbearing with and without shoegear. Pain is relieved with periodic professional debridement.  Patient states blood glucose was 140 mg/dl today.  Last A1c was 6.5%.  New problem(s): None.   PCP is Barbette Reichmann, MD , and last visit was  June 18, 2021  Allergies  Allergen Reactions   Sulfamethoxazole-Trimethoprim Other (See Comments) and Diarrhea   Other Nausea Only    Review of Systems: Negative except as noted in the HPI.  Objective: No changes noted in today's physical examination. There were no vitals filed for this visit.  Dale Key is a pleasant 66 y.o. adult in NAD. AAO X 3.  Vascular Examination: CFT <3 seconds b/l LE. Diminished DP pulse(s) b/l LE. Diminished PT pulse(s) b/l LE. Pedal hair absent. No pain with calf compression b/l. Lower extremity skin temperature gradient within normal limits. +2 pitting edema BLE. Evidence of chronic venous insufficiency b/l LE. No ischemia or gangrene noted b/l LE. No cyanosis or clubbing noted b/l LE.  Dermatological Examination: No open wounds b/l LE. No interdigital macerations noted b/l LE. Toenails 1-5 b/l elongated, discolored, dystrophic, thickened, crumbly with subungual debris and tenderness to dorsal palpation. Hyperkeratotic lesion(s) bilateral great toes and 1st metatarsal head b/l lower extremities.  No erythema, no edema, no drainage, no fluctuance. Preulcerative lesion  noted L 3rd toe. There is visible subdermal hemorrhage. There is no surrounding erythema, no edema, no drainage, no odor, no fluctuance. Hyperkeratotic lesion(s) medial IPJ of right great toe, plantar IPJ of left great toe, submet head 5 right foot, and 1st metatarsal head b/l lower extremities. No erythema, no edema, no drainage, no fluctuance noted.  Hyperpigmentation consistent with findings of chronic venous insufficiency is present b/l lower extremities. Pedal skin noted to be dry b/l lower extremities.  Neurological Examination: Protective sensation intact 5/5 intact bilaterally with 10g monofilament b/l. Vibratory sensation intact b/l.  Musculoskeletal Examination: Normal muscle strength 5/5 to all lower extremity muscle groups bilaterally. Hammertoe deformity noted 2-5 b/l.Marland Kitchen No pain, crepitus or joint limitation noted with ROM b/l LE.  Patient ambulates independently without assistive aids. Assessment/Plan: 1. Pain due to onychomycosis of toenails of both feet   2. Corns and callosities   3. Type II diabetes mellitus with peripheral circulatory disorder (HCC)     -Examined patient. -No new findings. No new orders. -Medicaid ABN signed for services of paring of corn(s)/callus(es)/porokeratos(es) today. Copy in patient chart. -Toenails 1-5 b/l were debrided in length and girth with sterile nail nippers and dremel without iatrogenic bleeding.  -Corn(s) left third digit and callus(es) medial IPJ of right great toe, plantar IPJ of left great toe, submet head 5 right foot, and 1st metatarsal head b/l lower extremities were pared utilizing sterile scalpel blade without incident. Total number debrided =6. -Patient/POA to call should there be question/concern in the interim.   Return in about 3 months (around 11/20/2021).  Freddie Breech, DPM

## 2021-10-01 DIAGNOSIS — H401121 Primary open-angle glaucoma, left eye, mild stage: Secondary | ICD-10-CM | POA: Insufficient documentation

## 2021-10-01 DIAGNOSIS — H2513 Age-related nuclear cataract, bilateral: Secondary | ICD-10-CM | POA: Insufficient documentation

## 2021-12-10 ENCOUNTER — Ambulatory Visit (INDEPENDENT_AMBULATORY_CARE_PROVIDER_SITE_OTHER): Payer: Medicare Other | Admitting: Podiatry

## 2021-12-10 ENCOUNTER — Encounter: Payer: Self-pay | Admitting: Podiatry

## 2021-12-10 DIAGNOSIS — M79675 Pain in left toe(s): Secondary | ICD-10-CM | POA: Diagnosis not present

## 2021-12-10 DIAGNOSIS — B351 Tinea unguium: Secondary | ICD-10-CM

## 2021-12-10 DIAGNOSIS — L84 Corns and callosities: Secondary | ICD-10-CM

## 2021-12-10 DIAGNOSIS — M79674 Pain in right toe(s): Secondary | ICD-10-CM | POA: Diagnosis not present

## 2021-12-10 DIAGNOSIS — E1151 Type 2 diabetes mellitus with diabetic peripheral angiopathy without gangrene: Secondary | ICD-10-CM

## 2021-12-13 NOTE — Progress Notes (Signed)
  Subjective:  Patient ID: Dale Key, adult    DOB: 10-30-1955,  MRN: [id number]  Praneeth Bussey presents to clinic today for at risk foot care. Pt has h/o NIDDM with PAD  Chief Complaint  Patient presents with   Nail Problem    Diabetic foot care BS-119 yesterday A1C-6.5 PCP-Hande PCP VST-2 months ago    New problem(s): Patient c/o heel pain of left heel for the past few weeks. He denies any preceding episodes of trauma. Pain presents after periods of rest and first step in the a.m.  He has not tried anything for treatment.  PCP is Barbette Reichmann, MD.  Allergies  Allergen Reactions   Sulfamethoxazole-Trimethoprim Other (See Comments) and Diarrhea   Other Nausea Only   Review of Systems: Negative except as noted in the HPI.  Objective: No changes noted in today's physical examination. There were no vitals filed for this visit.  Dale Key is a pleasant 66 y.o. adult morbidly obese in NAD. AAO x 3.  Vascular Examination: CFT <3 seconds b/l LE. Diminished DP pulse(s) b/l LE. Diminished PT pulse(s) b/l LE. Pedal hair absent. No pain with calf compression b/l. Lower extremity skin temperature gradient within normal limits. +2 pitting edema BLE. Evidence of chronic venous insufficiency b/l LE. No ischemia or gangrene noted b/l LE. No cyanosis or clubbing noted b/l LE.  Dermatological Examination: No open wounds b/l LE. No interdigital macerations noted b/l LE. Toenails 1-5 b/l elongated, discolored, dystrophic, thickened, crumbly with subungual debris and tenderness to dorsal palpation.   Hyperkeratotic lesion(s) b/l heels.  No erythema, no edema, no drainage, no fluctuance.   Preulcerative lesion noted L 3rd toe. There is visible subdermal hemorrhage. There is no surrounding erythema, no edema, no drainage, no Key, no fluctuance.   Hyperpigmentation consistent with findings of chronic venous insufficiency is present b/l lower extremities. Pedal skin noted to be dry b/l  lower extremities.  Neurological Examination: Protective sensation intact 5/5 intact bilaterally with 10g monofilament b/l. Vibratory sensation intact b/l.  Musculoskeletal Examination: Normal muscle strength 5/5 to all lower extremity muscle groups bilaterally. Hammertoe deformity noted 2-5 b/l.Marland Kitchen No pain, crepitus or joint limitation noted with ROM b/l LE.  Patient ambulates independently without assistive aids. Pain noted on palpation medial tubercle left heel.  Assessment/Plan: 1. Pain due to onychomycosis of toenails of both feet   2. Callus   3. Type II diabetes mellitus with peripheral circulatory disorder (HCC)     No orders of the defined types were placed in this encounter.   -Consent given for treatment as described below: -Examined patient. -Continue foot and shoe inspections daily. Monitor blood glucose per PCP/Endocrinologist's recommendations. -Continue supportive shoe gear daily. -Toenails 1-5 b/l were debrided in length and girth with sterile nail nippers and dremel without iatrogenic bleeding.  -Callus(es) bilateral heels pared utilizing rotary bur without complication or incident. Total number pared =2. -Patient scheduled to see Dr. Nicholes Rough in the next week for evaluation of left heel pain. -Patient/POA to call should there be question/concern in the interim.   Return in about 3 months (around 03/12/2022).  Dale Key, DPM

## 2021-12-17 ENCOUNTER — Ambulatory Visit: Payer: Medicare Other | Admitting: Podiatry

## 2021-12-24 ENCOUNTER — Ambulatory Visit: Payer: Medicare Other | Admitting: Podiatry

## 2021-12-25 ENCOUNTER — Ambulatory Visit (INDEPENDENT_AMBULATORY_CARE_PROVIDER_SITE_OTHER): Payer: Medicare Other | Admitting: Podiatry

## 2021-12-25 ENCOUNTER — Ambulatory Visit (INDEPENDENT_AMBULATORY_CARE_PROVIDER_SITE_OTHER): Payer: Medicare Other

## 2021-12-25 ENCOUNTER — Encounter: Payer: Self-pay | Admitting: Podiatry

## 2021-12-25 VITALS — BP 124/53

## 2021-12-25 DIAGNOSIS — M722 Plantar fascial fibromatosis: Secondary | ICD-10-CM

## 2021-12-25 DIAGNOSIS — M2042 Other hammer toe(s) (acquired), left foot: Secondary | ICD-10-CM

## 2021-12-25 MED ORDER — TRIAMCINOLONE ACETONIDE 10 MG/ML IJ SUSP
10.0000 mg | Freq: Once | INTRAMUSCULAR | Status: AC
  Administered 2021-12-25: 10 mg

## 2021-12-25 NOTE — Progress Notes (Signed)
Subjective:   Patient ID: Dale Key, adult   DOB: 66 y.o.   MRN: [id number]   HPI Patient presents stating a lot of pain in the left heel for the last couple months it has been sharp and does not remember injury   ROS      Objective:  Physical Exam  Neurovascular status intact with patient found to have discomfort in the left medial and center of the fascia with thickness and pain     Assessment:  Appears to be acute fasciitis cannot rule out the dry skin being part of the problem     Plan:  Reviewed condition and x-ray sterile prep injected the plantar fascia left 3 mg Kenalog 5 mg Xylocaine advised on treating the skin and reappoint as symptoms indicate  X-rays indicate severe flatfoot deformity large plantar spur formation with arthritis no indication of stress fracture calcaneus

## 2022-03-31 ENCOUNTER — Ambulatory Visit (INDEPENDENT_AMBULATORY_CARE_PROVIDER_SITE_OTHER): Payer: 59 | Admitting: Podiatry

## 2022-03-31 VITALS — BP 103/53

## 2022-03-31 DIAGNOSIS — E1151 Type 2 diabetes mellitus with diabetic peripheral angiopathy without gangrene: Secondary | ICD-10-CM

## 2022-03-31 DIAGNOSIS — L84 Corns and callosities: Secondary | ICD-10-CM | POA: Diagnosis not present

## 2022-03-31 DIAGNOSIS — R460 Very low level of personal hygiene: Secondary | ICD-10-CM

## 2022-03-31 DIAGNOSIS — B351 Tinea unguium: Secondary | ICD-10-CM

## 2022-03-31 DIAGNOSIS — M79675 Pain in left toe(s): Secondary | ICD-10-CM

## 2022-03-31 DIAGNOSIS — B353 Tinea pedis: Secondary | ICD-10-CM | POA: Diagnosis not present

## 2022-03-31 DIAGNOSIS — M79674 Pain in right toe(s): Secondary | ICD-10-CM

## 2022-03-31 MED ORDER — KETOCONAZOLE 2 % EX CREA: TOPICAL_CREAM | CUTANEOUS | 1 refills | Status: DC

## 2022-03-31 NOTE — Patient Instructions (Signed)
WEEKLY FOOT SOAK INSTRUCTIONS FOR FOOT HYGIENE   SOAK FEET IN [place] SOAPY WATER FOR 10 MINUTES. HAVE A FAMILY MEMBER OR CAREGIVER CHECK THE WATER TEMPERATURE FOR YOU BEFORE SUBMERGING YOUR FEET IN THE WATER.  2.  DRY FEET WELL TAKING CARE TO DRY WELL BETWEEN TOES AND UNDER TOES.  3.  APPLY Aquaphor Lotion TO FEET AVOIDING APPLICATION BETWEEN TOES.

## 2022-03-31 NOTE — Progress Notes (Unsigned)
  Subjective:  Patient ID: Dale Key, adult    DOB: 06-03-1955,  MRN: [id number]  Dale Key presents to clinic today for {jgcomplaint:23593}  Chief Complaint  Patient presents with   Nail Problem    [facility] BS-did not check today A1C-6.4 PCP-Hande PCP VST-03/12/2022   New problem(s): None. {jgcomplaint:23593}  PCP is Hande, Cherlyn Labella, MD.  Allergies  Allergen Reactions   Sulfamethoxazole-Trimethoprim Other (See Comments) and Diarrhea   Other Nausea Only    Review of Systems: Negative except as noted in the HPI.  Objective: No changes noted in today's physical examination. Vitals:   03/31/22 1545  BP: (!) 103/53   Dale Key is a pleasant 67 y.o. adult {jgbodyhabitus:24098} AAO x 3.  Assessment/Plan: No diagnosis found.  No orders of the defined types were placed in this encounter.   None {Jgplan:23602::"-Patient/POA to call should there be question/concern in the interim."}   No follow-ups on file.  Marzetta Board, DPM

## 2022-04-01 ENCOUNTER — Telehealth: Payer: Self-pay | Admitting: Podiatry

## 2022-04-01 NOTE — Telephone Encounter (Signed)
Pt called and asking if anyone turned in a [employer] dept card he lost it yesterday.Dale Key

## 2022-04-02 ENCOUNTER — Encounter: Payer: Self-pay | Admitting: Podiatry

## 2022-05-21 ENCOUNTER — Other Ambulatory Visit: Payer: Self-pay

## 2022-05-21 DIAGNOSIS — M4712 Other spondylosis with myelopathy, cervical region: Secondary | ICD-10-CM

## 2022-05-21 DIAGNOSIS — M5003 Cervical disc disorder with myelopathy, cervicothoracic region: Secondary | ICD-10-CM

## 2022-06-05 ENCOUNTER — Other Ambulatory Visit: Payer: Self-pay | Admitting: Orthopedic Surgery

## 2022-06-05 DIAGNOSIS — M5003 Cervical disc disorder with myelopathy, cervicothoracic region: Secondary | ICD-10-CM

## 2022-06-05 DIAGNOSIS — M4712 Other spondylosis with myelopathy, cervical region: Secondary | ICD-10-CM

## 2022-07-06 ENCOUNTER — Encounter (INDEPENDENT_AMBULATORY_CARE_PROVIDER_SITE_OTHER): Payer: 59 | Admitting: Podiatry

## 2022-07-06 DIAGNOSIS — Z91199 Patient's noncompliance with other medical treatment and regimen due to unspecified reason: Secondary | ICD-10-CM

## 2022-07-06 NOTE — Progress Notes (Signed)
This encounter was created in error - please disregard.  Patient was a no show for scheduled appt today. 

## 2022-07-27 ENCOUNTER — Other Ambulatory Visit: Payer: Medicare Other

## 2022-08-04 ENCOUNTER — Inpatient Hospital Stay: Admission: RE | Admit: 2022-08-04 | Payer: Medicare Other | Source: Ambulatory Visit

## 2022-08-11 ENCOUNTER — Other Ambulatory Visit: Payer: Medicare Other

## 2022-08-13 ENCOUNTER — Ambulatory Visit
Admission: RE | Admit: 2022-08-13 | Discharge: 2022-08-13 | Disposition: A | Discharge status: Home / Self Care | Payer: 59 | Source: Ambulatory Visit | Attending: Orthopedic Surgery | Admitting: Orthopedic Surgery

## 2022-08-13 DIAGNOSIS — M5003 Cervical disc disorder with myelopathy, cervicothoracic region: Secondary | ICD-10-CM

## 2022-08-13 DIAGNOSIS — M4712 Other spondylosis with myelopathy, cervical region: Secondary | ICD-10-CM

## 2022-08-13 MED ORDER — GADOPICLENOL 0.5 MMOL/ML IV SOLN
10.0000 mL | Freq: Once | INTRAVENOUS | Status: AC | PRN
  Administered 2022-08-13: 10 mL via INTRAVENOUS

## 2022-10-12 ENCOUNTER — Ambulatory Visit: Payer: 59 | Attending: Internal Medicine | Admitting: Physical Therapy

## 2022-10-20 ENCOUNTER — Ambulatory Visit: Payer: 59 | Attending: Internal Medicine | Admitting: Physical Therapy

## 2022-10-20 ENCOUNTER — Other Ambulatory Visit: Payer: Self-pay

## 2022-10-20 DIAGNOSIS — R2681 Unsteadiness on feet: Secondary | ICD-10-CM | POA: Diagnosis present

## 2022-10-20 DIAGNOSIS — M6281 Muscle weakness (generalized): Secondary | ICD-10-CM | POA: Diagnosis present

## 2022-10-20 DIAGNOSIS — R2689 Other abnormalities of gait and mobility: Secondary | ICD-10-CM | POA: Insufficient documentation

## 2022-10-20 NOTE — Therapy (Signed)
OUTPATIENT PHYSICAL THERAPY NEURO EVALUATION   Patient Name: Dale Key MRN: [id number] DOB:October 19, 1955, 67 y.o., adult Today's Date: 10/21/2022   PCP: Barbette Reichmann, MD REFERRING PROVIDER: Barbette Reichmann, MD  END OF SESSION:  PT End of Session - 10/21/22 1609     Visit Number 1    Number of Visits 13    Date for PT Re-Evaluation 12/04/22    Authorization Type [employer] Medicare    Progress Note Due on Visit 10    PT Start Time 1408    PT Stop Time 1446    PT Time Calculation (min) 38 min    Activity Tolerance Patient tolerated treatment well    Behavior During Therapy WFL for tasks assessed/performed             Past Medical History:  Diagnosis Date   Diabetes mellitus without complication (HCC)    Glaucoma    Hyperlipidemia    Hypertension    Tracheostomy present [facility])    Past Surgical History:  Procedure Laterality Date   ANTERIOR CERVICAL DECOMP/DISCECTOMY FUSION N/A 10/07/2012   Procedure: ANTERIOR CERVICAL DECOMPRESSION/DISCECTOMY FUSION 2 LEVELS;  Surgeon: Karn Cassis, MD;  Location: [facility] OR;  Service: Neurosurgery;  Laterality: N/A;  Anterior Cervical Three-Four/Four-Five Decompression and Fusion   EYE SURGERY     FOOT SURGERY Left    Pt states a tendon was removed to replace one in right hand.   HAND SURGERY Right    TRACHEOSTOMY     Patient Active Problem List   Diagnosis Date Noted   Age-related nuclear cataract of both eyes 10/01/2021   Primary open angle glaucoma of left eye, mild stage 10/01/2021   Abnormal weight loss 08/05/2020   Chronic idiopathic constipation 08/05/2020   Colon cancer screening 08/05/2020   Gastroesophageal reflux disease 08/05/2020   Low back pain 08/05/2020   Morbid obesity (HCC) 08/05/2020   History of colonic polyps 08/05/2020   Dyspnea on effort 07/30/2020   Thyroid nodule 11/13/2019   Lymphadenopathy 11/06/2019   Glaucoma of right eye 05/11/2019   Moderate nonproliferative diabetic retinopathy of both eyes  without macular edema associated with type 2 diabetes mellitus (HCC) 05/11/2019   Nuclear sclerotic cataract of both eyes 05/11/2019   OSA on CPAP 05/08/2019   Poor mobility 06/24/2018   Urinary tract infection due to ESBL Klebsiella 06/08/2018   Recurrent UTI 05/10/2018   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/10/2018   BMI 40.0-44.9, adult (HCC) 11/25/2017   Cervical disc disease with myelopathy 11/15/2017   Anemia 10/20/2017   Benign essential hypertension 10/20/2017   Constipation 10/20/2017   Controlled type 2 diabetes mellitus with hyperglycemia, without long-term current use of insulin (HCC) 10/20/2017   Generalized osteoarthritis of multiple sites 10/20/2017   Hoarseness or changing voice 10/20/2017   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   Upper extremity weakness 06/16/2017   Left arm weakness 06/16/2017   S/P cervical spinal fusion 04/29/2017   Bilateral lower extremity edema 01/09/2017   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   Bilateral vocal cord paralysis 12/22/2015   Vocal cord paralysis, unilateral complete 12/22/2015   Oropharyngeal dysphagia 12/02/2015   Low vitamin D level 11/19/2015   Elevated troponin level 11/19/2015   Hypertension 11/14/2015   Type 2 diabetes mellitus, without long-term current use of insulin (HCC) 11/14/2015   Type 2 diabetes mellitus (HCC) 11/14/2015   BPH (benign prostatic hyperplasia) 06/14/2014   Epididymitis, right 05/17/2014   Hematuria 05/17/2014   Swollen testicle 05/17/2014  Testis mass 05/17/2014   Gross hematuria 10/19/2013   Central cord syndrome (HCC) 10/13/2012    ONSET DATE: 09/28/2022 (MD referral)  REFERRING DIAG: Z74.09 (ICD-10-CM) - Poor mobility   THERAPY DIAG:  Muscle weakness (generalized)  Unsteadiness on feet  Other abnormalities of gait and mobility  Rationale for Evaluation and Treatment: Rehabilitation  SUBJECTIVE:                                                                                                                                                                                              SUBJECTIVE STATEMENT: Was doing good, but I need more strength in my legs.  Been a struggle ever since Covid, especially over the past 4-5 months, really haven't been doing much.  Having trouble with L shoulder and elbow Pt accompanied by: self  PERTINENT HISTORY: PMH of HTN, DM 2, COPD/asthma, cervical spine fusion, constipation, peripheral edema, OSA, vitamin D deficiency, gout who presents for hospital follow-up. Was seen in the emergency room on 06/24/2022 with shortness of breath, AKI/dehydration.   PAIN:  Are you having pain? Yes: NPRS scale: 7/10 Pain location: L knee Pain description: occasional Aggravating factors: unsure Relieving factors: cream  PRECAUTIONS: Fall  RED FLAGS: None   WEIGHT BEARING RESTRICTIONS: No  FALLS: Has patient fallen in last 6 months? Yes. 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  DIAGNOSTIC FINDINGS: NA for this episode  COGNITION: Overall cognitive status: Within functional limits for tasks assessed   SENSATION: Light touch: WFL  EDEMA:  BLE edema noted appears similar to lymphedema; reports he has had BLEs wrapped in the past and plans to have this done coming up.  POSTURE: rounded shoulders, forward head, posterior pelvic tilt, and flexed trunk   LOWER EXTREMITY ROM:   full ankle ROM limited due to edema at ankles/feet    LOWER EXTREMITY MMT:    MMT Right Eval  Left Eval  Hip flexion 3+ 3+  Hip extension    Hip abduction 4 4  Hip adduction 4 4  Hip internal rotation    Hip external rotation    Knee flexion 4+ 4+  Knee extension 4 4  Ankle dorsiflexion 4 4  Ankle plantarflexion    Ankle inversion  Ankle eversion    (Blank rows = not tested)    TRANSFERS: Assistive device utilized: None  Sit to stand: SBA BUE support due to knee pain Stand to sit: SBA BUE support   GAIT: Gait pattern:  LLE externally rotated, step through pattern, poor foot clearance- Right, and poor foot clearance- Left Distance walked: 170 ft Assistive device utilized: Single point cane Level of assistance: SBA Comments: forward flexed posture  FUNCTIONAL TESTS:  5 times sit to stand: 17.63 sec with BUE support Timed up and go (TUG): 21.4 sec 2 minute walk test: 166 ft 10 meter walk test: 23.09 sec ( 1.42 ft/sec)   TODAY'S TREATMENT:                                                                                                                              DATE: 10/20/2022    PATIENT EDUCATION: Education details: Eval results, POC, HEP  Person educated: Patient Education method: Explanation and Handouts Education comprehension: verbalized understanding  HOME EXERCISE PROGRAM: Access Code: [email or web address] URL: [email or web address] Date: 10/20/2022 Prepared by: [facility] - Outpatient  Rehab - [facility]  Exercises - Seated March  - 1 x daily - 7 x weekly - 3 sets - 10 reps - Seated Long Arc Quad  - 1 x daily - 7 x weekly - 3 sets - 10 reps - Seated Ankle Dorsiflexion AROM  - 1 x daily - 7 x weekly - 3 sets - 10 reps  GOALS: Goals reviewed with patient? Yes  SHORT TERM GOALS: Target date: 11/20/2022  Pt will be independent with HEP for improved strength, balance, gait. Baseline: Goal status: INITIAL  2.  Pt will improve 5x sit<>stand to less than or equal to 12 sec to demonstrate improved functional strength and transfer  efficiency. Baseline: 17.63 sec BUE support Goal status: INITIAL  3.  Pt will improve TUG score to less than or equal to 17 sec for decreased fall risk. Baseline: 21.63 sec Goal status: INITIAL  LONG TERM GOALS: Target date: 12/04/2022  Pt will be independent with HEP for improved balance, strength, gait. Baseline:  Goal status: INITIAL  2.  Pt will improve gait velocity to at least 1.8 ft/sec for improved gait efficiency and safety. Baseline: 1.42 ft/sec Goal status: INITIAL  3.  Pt will improve TUG score to less than or equal to 13 sec for decreased fall risk. Baseline: 21.4 sec Goal status: INITIAL  4.  Pt will ambulate 200 ft in 2 minute walk for improved gait endurance and efficiency. Baseline:  Goal status: INITIAL  5.  Pt will verbalize plans for continued community fitness upon d/c from PT. Baseline:  Goal status: INITIAL  ASSESSMENT:  CLINICAL IMPRESSION: Patient is a 67 y.o. male who was seen today for physical therapy evaluation and treatment for poor mobility.  He has PMH of central cord syndrome, cervical spine fusion, LUE and BLE weakness.  He presents with decreased strength, decreased  balance, decreased safety/independence with gait.  He has had one fall in the past 6 months.  He is at increased fall risk with FTSTS, TUG, and gait velocity scores.  He endorses general decreased strength and mobility over the past 4-5 months.  He will benefit from skilled PT to address the above stated deficits to improve functional mobility and decreased fall risk.  OBJECTIVE IMPAIRMENTS: Abnormal gait, decreased balance, decreased mobility, difficulty walking, decreased strength, impaired flexibility, and postural dysfunction.   ACTIVITY LIMITATIONS: standing, transfers, and locomotion level  PARTICIPATION LIMITATIONS: shopping, community activity, and occupation  PERSONAL FACTORS: 3+ comorbidities: see PMH  are also affecting patient's functional outcome.   REHAB  POTENTIAL: Good  CLINICAL DECISION MAKING: Evolving/moderate complexity  EVALUATION COMPLEXITY: Moderate  PLAN:  PT FREQUENCY: 1-2x/week  PT DURATION: 6 weeks  PLANNED INTERVENTIONS: 97110-Therapeutic exercises, 97530- Therapeutic activity, 97112- Neuromuscular re-education, 97535- Self Care, 40981- Manual therapy, [phone]- Gait training, [phone]- Orthotic Fit/training, Patient/Family education, Balance training, Therapeutic exercises, Therapeutic activity, Neuromuscular re-education, Gait training, and Self Care  PLAN FOR NEXT SESSION: Initiate HEP for BLE strengthening, standing balance, gait activities    Viktorya Arguijo W., PT 10/21/2022, 4:23 PM  [facility] at [facility] [address] [place], [place] [address] Phone # [phone] Fax # [phone]   [facility] at [facility] [address], [address] [place], [place] [address] Phone # [phone] Fax # [phone]

## 2022-10-21 ENCOUNTER — Encounter: Payer: Self-pay | Admitting: Physical Therapy

## 2022-10-27 ENCOUNTER — Ambulatory Visit: Payer: 59 | Admitting: Physical Therapy

## 2022-10-29 ENCOUNTER — Ambulatory Visit: Payer: 59 | Admitting: Physical Therapy

## 2022-10-29 ENCOUNTER — Encounter: Payer: Self-pay | Admitting: Physical Therapy

## 2022-10-29 DIAGNOSIS — R2681 Unsteadiness on feet: Secondary | ICD-10-CM

## 2022-10-29 DIAGNOSIS — R2689 Other abnormalities of gait and mobility: Secondary | ICD-10-CM

## 2022-10-29 DIAGNOSIS — M6281 Muscle weakness (generalized): Secondary | ICD-10-CM | POA: Diagnosis not present

## 2022-10-29 NOTE — Therapy (Signed)
OUTPATIENT PHYSICAL THERAPY NEURO TREATMENT NOTE   Patient Name: Dale Key MRN: [id number] DOB:11/11/1955, 67 y.o., adult Today's Date: 10/29/2022   PCP: Barbette Reichmann, MD REFERRING PROVIDER: Barbette Reichmann, MD  END OF SESSION:  PT End of Session - 10/29/22 1533     Visit Number 2    Number of Visits 13    Date for PT Re-Evaluation 12/04/22    Authorization Type UHC Medicare    Progress Note Due on Visit 10    PT Start Time 1531    PT Stop Time 1610    PT Time Calculation (min) 39 min    Activity Tolerance Patient tolerated treatment well    Behavior During Therapy WFL for tasks assessed/performed              Past Medical History:  Diagnosis Date   Diabetes mellitus without complication (HCC)    Glaucoma    Hyperlipidemia    Hypertension    Tracheostomy present [facility])    Past Surgical History:  Procedure Laterality Date   ANTERIOR CERVICAL DECOMP/DISCECTOMY FUSION N/A 10/07/2012   Procedure: ANTERIOR CERVICAL DECOMPRESSION/DISCECTOMY FUSION 2 LEVELS;  Surgeon: Karn Cassis, MD;  Location: [facility] OR;  Service: Neurosurgery;  Laterality: N/A;  Anterior Cervical Three-Four/Four-Five Decompression and Fusion   EYE SURGERY     FOOT SURGERY Left    Pt states a tendon was removed to replace one in right hand.   HAND SURGERY Right    TRACHEOSTOMY     Patient Active Problem List   Diagnosis Date Noted   Age-related nuclear cataract of both eyes 10/01/2021   Primary open angle glaucoma of left eye, mild stage 10/01/2021   Abnormal weight loss 08/05/2020   Chronic idiopathic constipation 08/05/2020   Colon cancer screening 08/05/2020   Gastroesophageal reflux disease 08/05/2020   Low back pain 08/05/2020   Morbid obesity (HCC) 08/05/2020   History of colonic polyps 08/05/2020   Dyspnea on effort 07/30/2020   Thyroid nodule 11/13/2019   Lymphadenopathy 11/06/2019   Glaucoma of right eye 05/11/2019   Moderate nonproliferative diabetic retinopathy of both  eyes without macular edema associated with type 2 diabetes mellitus (HCC) 05/11/2019   Nuclear sclerotic cataract of both eyes 05/11/2019   OSA on CPAP 05/08/2019   Poor mobility 06/24/2018   Urinary tract infection due to ESBL Klebsiella 06/08/2018   Recurrent UTI 05/10/2018   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/10/2018   BMI 40.0-44.9, adult (HCC) 11/25/2017   Cervical disc disease with myelopathy 11/15/2017   Anemia 10/20/2017   Benign essential hypertension 10/20/2017   Constipation 10/20/2017   Controlled type 2 diabetes mellitus with hyperglycemia, without long-term current use of insulin (HCC) 10/20/2017   Generalized osteoarthritis of multiple sites 10/20/2017   Hoarseness or changing voice 10/20/2017   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   Upper extremity weakness 06/16/2017   Left arm weakness 06/16/2017   S/P cervical spinal fusion 04/29/2017   Bilateral lower extremity edema 01/09/2017   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   Bilateral vocal cord paralysis 12/22/2015   Vocal cord paralysis, unilateral complete 12/22/2015   Oropharyngeal dysphagia 12/02/2015   Low vitamin D level 11/19/2015   Elevated troponin level 11/19/2015   Hypertension 11/14/2015   Type 2 diabetes mellitus, without long-term current use of insulin (HCC) 11/14/2015   Type 2 diabetes mellitus (HCC) 11/14/2015   BPH (benign prostatic hyperplasia) 06/14/2014   Epididymitis, right 05/17/2014   Hematuria 05/17/2014   Swollen  testicle 05/17/2014   Testis mass 05/17/2014   Gross hematuria 10/19/2013   Central cord syndrome (HCC) 10/13/2012    ONSET DATE: 09/28/2022 (MD referral)  REFERRING DIAG: Z74.09 (ICD-10-CM) - Poor mobility   THERAPY DIAG:  Muscle weakness (generalized)  Unsteadiness on feet  Other abnormalities of gait and mobility  Rationale for Evaluation and Treatment: Rehabilitation  SUBJECTIVE:                                                                                                                                                                                              SUBJECTIVE STATEMENT: No changes since last visit. Pt accompanied by: self  PERTINENT HISTORY: PMH of HTN, DM 2, COPD/asthma, cervical spine fusion, constipation, peripheral edema, OSA, vitamin D deficiency, gout who presents for hospital follow-up. Was seen in the emergency room on 06/24/2022 with shortness of breath, AKI/dehydration.   PAIN:  Are you having pain? Yes: NPRS scale: 7/10 Pain location: L knee Pain description: occasional Aggravating factors: unsure Relieving factors: cream  PRECAUTIONS: Fall  RED FLAGS: None   WEIGHT BEARING RESTRICTIONS: No  FALLS: Has patient fallen in last 6 months? Yes. 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 Date: 10/29/2022 Prepared by: [facility] - Outpatient  Rehab - [facility]  Exercises - Seated March  - 1 x daily - 7 x weekly - 3 sets - 10 reps - Seated Long Arc Quad  - 1 x daily - 7 x weekly - 3 sets - 10  reps - Seated Ankle Dorsiflexion AROM  - 1 x daily - 7 x weekly - 3 sets - 10 reps - Seated Hamstring Stretch  - 1 x daily - 7 x weekly -  1 sets - 3 reps - 30 sec hold - Seated Hip Adduction Isometrics with Ball  - 1 x daily - 7 x weekly - 3 sets - 10 reps - 3 sec hold - Standing Gastroc Stretch at Counter  - 1 x daily - 7 x weekly - 1 sets - 3 reps - 30 sec hold - Mini Squat with Counter Support  - 1 x daily - 7 x weekly - 3 sets - 3 reps  PATIENT EDUCATION: Education details: HEP additions Person educated: Patient Education method: Explanation, Demonstration, and Handouts Education comprehension: verbalized understanding, returned demonstration, and needs further education   --------------------------------------------------------------- Note: Objective measures below were completed at Evaluation unless otherwise noted.  DIAGNOSTIC FINDINGS: NA for this episode  COGNITION: Overall cognitive status: Within functional limits for tasks assessed   SENSATION: Light touch: WFL  EDEMA:  BLE edema noted appears similar to lymphedema; reports he has had BLEs wrapped in the past and plans to have this done coming up.  POSTURE: rounded shoulders, forward head, posterior pelvic tilt, and flexed trunk   LOWER EXTREMITY ROM:   full ankle ROM limited due to edema at ankles/feet    LOWER EXTREMITY MMT:    MMT Right Eval Left Eval  Hip flexion 3+ 3+  Hip extension    Hip abduction 4 4  Hip adduction 4 4  Hip internal rotation    Hip external rotation    Knee flexion 4+ 4+  Knee extension 4 4  Ankle dorsiflexion 4 4  Ankle plantarflexion    Ankle inversion    Ankle eversion    (Blank rows = not tested)    TRANSFERS: Assistive device utilized: None  Sit to stand: SBA BUE support due to knee pain Stand to sit: SBA BUE support   GAIT: Gait pattern:  LLE externally rotated, step through pattern, poor foot clearance- Right, and poor foot clearance- Left Distance walked: 170  ft Assistive device utilized: Single point cane Level of assistance: SBA Comments: forward flexed posture  FUNCTIONAL TESTS:  5 times sit to stand: 17.63 sec with BUE support Timed up and go (TUG): 21.4 sec 2 minute walk test: 166 ft 10 meter walk test: 23.09 sec ( 1.42 ft/sec)   TODAY'S TREATMENT:                                                                                                                              DATE: 10/20/2022    PATIENT EDUCATION: Education details: Eval results, POC, HEP  Person educated: Patient Education method: Explanation and Handouts Education comprehension: verbalized understanding  HOME EXERCISE PROGRAM: Access Code: [email or web address] URL: [email or web address] Date: 10/20/2022 Prepared by: [facility] - Outpatient  Rehab - [facility]  Exercises - Seated March  - 1 x daily - 7 x weekly - 3 sets - 10 reps - Seated Long Arc Quad  - 1 x daily - 7 x weekly - 3 sets - 10 reps -  Seated Ankle Dorsiflexion AROM  - 1 x daily - 7 x weekly - 3 sets - 10 reps  GOALS: Goals reviewed with patient? Yes  SHORT TERM GOALS: Target date: 11/20/2022  Pt will be independent with HEP for improved strength, balance, gait. Baseline: Goal status: INITIAL  2.  Pt will improve 5x sit<>stand to less than or equal to 12 sec to demonstrate improved functional strength and transfer efficiency. Baseline: 17.63 sec BUE support Goal status: INITIAL  3.  Pt will improve TUG score to less than or equal to 17 sec for decreased fall risk. Baseline: 21.63 sec Goal status: INITIAL  LONG TERM GOALS: Target date: 12/04/2022  Pt will be independent with HEP for improved balance, strength, gait. Baseline:  Goal status: INITIAL  2.  Pt will improve gait velocity to at least 1.8 ft/sec for improved gait efficiency and safety. Baseline: 1.42 ft/sec Goal status: INITIAL  3.  Pt will improve TUG score to less than or equal to 13 sec for decreased fall  risk. Baseline: 21.4 sec Goal status: INITIAL  4.  Pt will ambulate 200 ft in 2 minute walk for improved gait endurance and efficiency. Baseline:  Goal status: INITIAL  5.  Pt will verbalize plans for continued community fitness upon d/c from PT. Baseline:  Goal status: INITIAL  ASSESSMENT:  CLINICAL IMPRESSION: Skilled PT session today focused on review of HEP and progression of HEP.  He is able to perform seated exercises with addition of 2# weight, and discussed getting ankle weights for home.  Focused on hamstring and gastroc stretches as well as seated aerobic stepper. He continues to demo some increased tone with gait pattern.  Pt has some type of stepper at home and pt will likely benefit from education on use of that for more flexibility and strength.  He will continue to benefit from skilled PT to further address balance and strength for improved gait.    OBJECTIVE IMPAIRMENTS: Abnormal gait, decreased balance, decreased mobility, difficulty walking, decreased strength, impaired flexibility, and postural dysfunction.   ACTIVITY LIMITATIONS: standing, transfers, and locomotion level  PARTICIPATION LIMITATIONS: shopping, community activity, and occupation  PERSONAL FACTORS: 3+ comorbidities: see PMH  are also affecting patient's functional outcome.   REHAB POTENTIAL: Good  CLINICAL DECISION MAKING: Evolving/moderate complexity  EVALUATION COMPLEXITY: Moderate  PLAN:  PT FREQUENCY: 1-2x/week  PT DURATION: 6 weeks  PLANNED INTERVENTIONS: 97110-Therapeutic exercises, 97530- Therapeutic activity, 97112- Neuromuscular re-education, 97535- Self Care, 91478- Manual therapy, [phone]- Gait training, [phone]- Orthotic Fit/training, Patient/Family education, Balance training, Therapeutic exercises, Therapeutic activity, Neuromuscular re-education, Gait training, and Self Care  PLAN FOR NEXT SESSION: Review HEP for BLE strengthening, standing balance, gait activities.  Pt to bring in  picture of his stepper at home and would benefit from education on more consistent use.   Gean Maidens., PT 10/29/2022, 4:13 PM  [facility] [facility] at [facility] [address] [place], [address] [place], [place] [address] Phone # [phone] Fax # [phone]   [facility] at [facility] [address] [place], [address] [place], [place] [address] Phone # [phone] Fax # [phone]

## 2022-11-03 ENCOUNTER — Ambulatory Visit: Payer: 59 | Admitting: Physical Therapy

## 2022-11-05 ENCOUNTER — Ambulatory Visit: Payer: 59 | Admitting: Physical Therapy

## 2022-11-05 ENCOUNTER — Encounter: Payer: Self-pay | Admitting: Physical Therapy

## 2022-11-05 DIAGNOSIS — M6281 Muscle weakness (generalized): Secondary | ICD-10-CM | POA: Diagnosis not present

## 2022-11-05 DIAGNOSIS — R2681 Unsteadiness on feet: Secondary | ICD-10-CM

## 2022-11-05 DIAGNOSIS — R2689 Other abnormalities of gait and mobility: Secondary | ICD-10-CM

## 2022-11-05 NOTE — Therapy (Signed)
OUTPATIENT PHYSICAL THERAPY NEURO TREATMENT NOTE   Patient Name: Dale Key MRN: [id number] DOB:[birth year], 67 y.o., adult Today's Date: 11/05/2022   PCP: Barbette Reichmann, MD REFERRING PROVIDER: Barbette Reichmann, MD  END OF SESSION:  PT End of Session - 11/05/22 1418     Visit Number 3    Number of Visits 13    Date for PT Re-Evaluation 12/04/22    Authorization Type [employer]    Progress Note Due on Visit 10    PT Start Time 1415   pt arrives late   PT Stop Time 1455    PT Time Calculation (min) 40 min    Activity Tolerance Patient tolerated treatment well    Behavior During Therapy WFL for tasks assessed/performed               Past Medical History:  Diagnosis Date   Diabetes mellitus without complication (HCC)    Glaucoma    Hyperlipidemia    Hypertension    Tracheostomy present [facility])    Past Surgical History:  Procedure Laterality Date   ANTERIOR CERVICAL DECOMP/DISCECTOMY FUSION N/A 10/07/2012   Procedure: ANTERIOR CERVICAL DECOMPRESSION/DISCECTOMY FUSION 2 LEVELS;  Surgeon: Karn Cassis, MD;  Location: [facility] OR;  Service: Neurosurgery;  Laterality: N/A;  Anterior Cervical Three-Four/Four-Five Decompression and Fusion   EYE SURGERY     FOOT SURGERY Left    Pt states a tendon was removed to replace one in right hand.   HAND SURGERY Right    TRACHEOSTOMY     Patient Active Problem List   Diagnosis Date Noted   Age-related nuclear cataract of both eyes 10/01/2021   Primary open angle glaucoma of left eye, mild stage 10/01/2021   Abnormal weight loss 08/05/2020   Chronic idiopathic constipation 08/05/2020   Colon cancer screening 08/05/2020   Gastroesophageal reflux disease 08/05/2020   Low back pain 08/05/2020   Morbid obesity (HCC) 08/05/2020   History of colonic polyps 08/05/2020   Dyspnea on effort 07/30/2020   Thyroid nodule 11/13/2019   Lymphadenopathy 11/06/2019   Glaucoma of right eye 05/11/2019   Moderate nonproliferative diabetic  retinopathy 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 edema associated with type 2 diabetes mellitus (HCC) 05/11/2019   Nuclear sclerotic cataract of both eyes 05/11/2019   OSA on CPAP 05/08/2019   Poor mobility 06/24/2018   Urinary tract infection due to ESBL Klebsiella 06/08/2018   Recurrent UTI 05/10/2018   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/10/2018   BMI 40.0-44.9, adult (HCC) 11/25/2017   Cervical disc disease with myelopathy 11/15/2017   Anemia 10/20/2017   Benign essential hypertension 10/20/2017   Constipation 10/20/2017   Controlled type 2 diabetes mellitus with hyperglycemia, without long-term current use of insulin (HCC) 10/20/2017   Generalized osteoarthritis of multiple sites 10/20/2017   Hoarseness or changing voice 10/20/2017   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   Upper extremity weakness 06/16/2017   Left arm weakness 06/16/2017   S/P cervical spinal fusion 04/29/2017   Bilateral lower extremity edema 01/09/2017   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   Bilateral vocal cord paralysis 12/22/2015   Vocal cord paralysis, unilateral complete 12/22/2015   Oropharyngeal dysphagia 12/02/2015   Low vitamin D level 11/19/2015   Elevated troponin level 11/19/2015   Hypertension 11/14/2015   Type 2 diabetes mellitus, without long-term current use of insulin (HCC) 11/14/2015   Type 2 diabetes mellitus (HCC) 11/14/2015   BPH (benign prostatic hyperplasia) 06/14/2014   Epididymitis, right 05/17/2014  Hematuria 05/17/2014   Swollen testicle 05/17/2014   Testis mass 05/17/2014   Gross hematuria 10/19/2013   Central cord syndrome (HCC) 10/13/2012    ONSET DATE: 09/28/2022 (MD referral)  REFERRING DIAG: Z74.09 (ICD-10-CM) - Poor mobility   THERAPY DIAG:  Muscle weakness (generalized)  Unsteadiness on feet  Other abnormalities of gait and mobility  Rationale for Evaluation and Treatment: Rehabilitation  SUBJECTIVE:                                                                                                                                                                                              SUBJECTIVE STATEMENT: Got my Covid shot and that made me have a hard time walking.  Saw a new MD and he changed medication to try to address swelling in legs; haven't picked it up yet. Pt accompanied by: self  PERTINENT HISTORY: PMH of HTN, DM 2, COPD/asthma, cervical spine fusion, constipation, peripheral edema, OSA, vitamin D deficiency, gout who presents for hospital follow-up. Was seen in the emergency room on 06/24/2022 with shortness of breath, AKI/dehydration.   PAIN:  Are you having pain? Yes: NPRS scale: 7/10 Pain location: L knee Pain description: occasional Aggravating factors: unsure Relieving factors: cream  PRECAUTIONS: Fall  RED FLAGS: None   WEIGHT BEARING RESTRICTIONS: No  FALLS: Has patient fallen in last 6 months? Yes. Number of falls 1  LIVING ENVIRONMENT: Lives with: lives alone Lives in: House/apartment Stairs: Ramp Has following equipment at home: Single point cane  PLOF: Independent with household mobility with device and Independent with community mobility with device Has gone to [employer] in the past (>1 year); works some as Curator PATIENT GOALS: Get better leg strength  OBJECTIVE:     TODAY'S TREATMENT: 11/05/2022 Activity Comments  Seated march 2 x 10 Seated LAQ 2 x 10 2#  Seated hip abduction, 2 x 10 blue theraband   Seated hip adduction 2 x 10, 3" hold   Seated hamstring stretch, 2 x 30 sec, foot propped on step Good form  Standing gastroc (runner's stretch) 1 x 30 sec At counter-good form  Minisquats at counter x 10 Cues for upright posture-glut and quad set  Standing balance:   Alt hip extension 2 x 10 Alt hip/knee flexion 2 x 10 Alt hip abduction 2 x 10  Alt step taps to 4" step, 2 x 10 Cues for posture   HOME EXERCISE PROGRAM: Access Code: [email or web address] URL:  [email or web address] Date: 11/05/2022 Prepared by: [facility] - Outpatient  Rehab - [facility]  Exercises - Seated March  - 1 x daily - 7 x weekly - 3  sets - 10 reps - Seated Long Arc Quad  - 1 x daily - 7 x weekly - 3 sets - 10 reps - Seated Ankle Dorsiflexion AROM  - 1 x daily - 7 x weekly - 3 sets - 10 reps - Seated Hamstring Stretch  - 1 x daily - 7 x weekly - 1 sets - 3 reps - 30 sec hold - Seated Hip Adduction Isometrics with Ball  - 1 x daily - 7 x weekly - 3 sets - 10 reps - 3 sec hold - Standing Gastroc Stretch at Counter  - 1 x daily - 7 x weekly - 1 sets - 3 reps - 30 sec hold - Mini Squat with Counter Support  - 1 x daily - 7 x weekly - 3 sets - 3 reps - Standing Hip Abduction with Counter Support  - 1 x daily - 7 x weekly - 3 sets - 10 reps - Standing Hip Extension with Counter Support  - 1 x daily - 7 x weekly - 3 sets - 10 reps - Alternating Step Taps with Counter Support  - 1 x daily - 7 x weekly - 3 sets - 10 reps    PATIENT EDUCATION: Education details: HEP additions; began discussing walking program for home-walking 3-4 minutes, 2-3x/day in home Person educated: Patient Education method: Explanation, Demonstration, and Handouts Education comprehension: verbalized understanding, returned demonstration, and needs further education   --------------------------------------------------------------- Note: Objective measures below were completed at Evaluation unless otherwise noted.  DIAGNOSTIC FINDINGS: NA for this episode  COGNITION: Overall cognitive status: Within functional limits for tasks assessed   SENSATION: Light touch: WFL  EDEMA:  BLE edema noted appears similar to lymphedema; reports he has had BLEs wrapped in the past and plans to have this done coming up.  POSTURE: rounded shoulders, forward head, posterior pelvic tilt, and flexed trunk   LOWER EXTREMITY ROM:   full ankle ROM limited due to edema at ankles/feet    LOWER  EXTREMITY MMT:    MMT Right Eval Left Eval  Hip flexion 3+ 3+  Hip extension    Hip abduction 4 4  Hip adduction 4 4  Hip internal rotation    Hip external rotation    Knee flexion 4+ 4+  Knee extension 4 4  Ankle dorsiflexion 4 4  Ankle plantarflexion    Ankle inversion    Ankle eversion    (Blank rows = not tested)    TRANSFERS: Assistive device utilized: None  Sit to stand: SBA BUE support due to knee pain Stand to sit: SBA BUE support   GAIT: Gait pattern:  LLE externally rotated, step through pattern, poor foot clearance- Right, and poor foot clearance- Left Distance walked: 170 ft Assistive device utilized: Single point cane Level of assistance: SBA Comments: forward flexed posture  FUNCTIONAL TESTS:  5 times sit to stand: 17.63 sec with BUE support Timed up and go (TUG): 21.4 sec 2 minute walk test: 166 ft 10 meter walk test: 23.09 sec ( 1.42 ft/sec)   TODAY'S TREATMENT:  DATE: 10/20/2022    PATIENT EDUCATION: Education details: Eval results, POC, HEP  Person educated: Patient Education method: Explanation and Handouts Education comprehension: verbalized understanding  HOME EXERCISE PROGRAM: Access Code: [email or web address] URL: [email or web address] Date: 10/20/2022 Prepared by: [facility] - Outpatient  Rehab - [facility]  Exercises - Seated March  - 1 x daily - 7 x weekly - 3 sets - 10 reps - Seated Long Arc Quad  - 1 x daily - 7 x weekly - 3 sets - 10 reps - Seated Ankle Dorsiflexion AROM  - 1 x daily - 7 x weekly - 3 sets - 10 reps  GOALS: Goals reviewed with patient? Yes  SHORT TERM GOALS: Target date: 11/20/2022  Pt will be independent with HEP for improved strength, balance, gait. Baseline: Goal status: INITIAL  2.  Pt will improve 5x sit<>stand to less than or equal to 12 sec to demonstrate improved  functional strength and transfer efficiency. Baseline: 17.63 sec BUE support Goal status: INITIAL  3.  Pt will improve TUG score to less than or equal to 17 sec for decreased fall risk. Baseline: 21.63 sec Goal status: INITIAL  LONG TERM GOALS: Target date: 12/04/2022  Pt will be independent with HEP for improved balance, strength, gait. Baseline:  Goal status: INITIAL  2.  Pt will improve gait velocity to at least 1.8 ft/sec for improved gait efficiency and safety. Baseline: 1.42 ft/sec Goal status: INITIAL  3.  Pt will improve TUG score to less than or equal to 13 sec for decreased fall risk. Baseline: 21.4 sec Goal status: INITIAL  4.  Pt will ambulate 200 ft in 2 minute walk for improved gait endurance and efficiency. Baseline:  Goal status: INITIAL  5.  Pt will verbalize plans for continued community fitness upon d/c from PT. Baseline:  Goal status: INITIAL  ASSESSMENT:  CLINICAL IMPRESSION: Skilled PT session today focused on review of HEP additions as well as standing balance/strengthening.  Used 2# ankle weights for seated strengthening, with pt tolerating well.  For standing balance, pt uses 1-2 UE support and maintains balance with UE support; he does need cues for posture.  Added to HEP.  He does report increased BLE swelling and that MD is addressing with new medication (he has not gotten it yet).  He will continue to benefit from skilled PT to further address balance and strength for improved gait.    OBJECTIVE IMPAIRMENTS: Abnormal gait, decreased balance, decreased mobility, difficulty walking, decreased strength, impaired flexibility, and postural dysfunction.   ACTIVITY LIMITATIONS: standing, transfers, and locomotion level  PARTICIPATION LIMITATIONS: shopping, community activity, and occupation  PERSONAL FACTORS: 3+ comorbidities: see PMH  are also affecting patient's functional outcome.   REHAB POTENTIAL: Good  CLINICAL DECISION MAKING:  Evolving/moderate complexity  EVALUATION COMPLEXITY: Moderate  PLAN:  PT FREQUENCY: 1-2x/week  PT DURATION: 6 weeks  PLANNED INTERVENTIONS: 97110-Therapeutic exercises, 97530- Therapeutic activity, 97112- Neuromuscular re-education, 97535- Self Care, 10932- Manual therapy, [phone]- Gait training, [phone]- Orthotic Fit/training, Patient/Family education, Balance training, Therapeutic exercises, Therapeutic activity, Neuromuscular re-education, Gait training, and Self Care  PLAN FOR NEXT SESSION: Review HEP additions and continue to progress for BLE strengthening, standing balance, gait activities.  Pt to bring in picture of his stepper at home and would benefit from education on more consistent use.   Gean Maidens., PT 11/05/2022, 2:59 PM  [facility] [address] [place], [address] [place], [place] [address] Phone # [phone] Fax # [phone]

## 2022-11-09 NOTE — Therapy (Signed)
OUTPATIENT PHYSICAL THERAPY NEURO TREATMENT NOTE   Patient Name: Dale Key MRN: [id number] DOB:01/08/1956, 67 y.o., adult Today's Date: 11/10/2022   PCP: Barbette Reichmann, MD REFERRING PROVIDER: Barbette Reichmann, MD  END OF SESSION:  PT End of Session - 11/10/22 1357     Visit Number 4    Number of Visits 13    Date for PT Re-Evaluation 12/04/22    Authorization Type [employer] Medicare    Progress Note Due on Visit 10    PT Start Time 1334   pt late   PT Stop Time 1357    PT Time Calculation (min) 23 min    Activity Tolerance Patient tolerated treatment well    Behavior During Therapy WFL for tasks assessed/performed                Past Medical History:  Diagnosis Date   Diabetes mellitus without complication (HCC)    Glaucoma    Hyperlipidemia    Hypertension    Tracheostomy present [facility])    Past Surgical History:  Procedure Laterality Date   ANTERIOR CERVICAL DECOMP/DISCECTOMY FUSION N/A 10/07/2012   Procedure: ANTERIOR CERVICAL DECOMPRESSION/DISCECTOMY FUSION 2 LEVELS;  Surgeon: Karn Cassis, MD;  Location: [facility];  Service: Neurosurgery;  Laterality: N/A;  Anterior Cervical Three-Four/Four-Five Decompression and Fusion   EYE SURGERY     FOOT SURGERY Left    Pt states a tendon was removed to replace one in right hand.   HAND SURGERY Right    TRACHEOSTOMY     Patient Active Problem List   Diagnosis Date Noted   Age-related nuclear cataract of both eyes 10/01/2021   Primary open angle glaucoma of left eye, mild stage 10/01/2021   Abnormal weight loss 08/05/2020   Chronic idiopathic constipation 08/05/2020   Colon cancer screening 08/05/2020   Gastroesophageal reflux disease 08/05/2020   Low back pain 08/05/2020   Morbid obesity (HCC) 08/05/2020   History of colonic polyps 08/05/2020   Dyspnea on effort 07/30/2020   Thyroid nodule 11/13/2019   Lymphadenopathy 11/06/2019   Glaucoma of right eye 05/11/2019   Moderate nonproliferative diabetic  retinopathy 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 edema associated with type 2 diabetes mellitus (HCC) 05/11/2019   Nuclear sclerotic cataract of both eyes 05/11/2019   OSA on CPAP 05/08/2019   Poor mobility 06/24/2018   Urinary tract infection due to ESBL Klebsiella 06/08/2018   Recurrent UTI 05/10/2018   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/10/2018   BMI 40.0-44.9, adult (HCC) 11/25/2017   Cervical disc disease with myelopathy 11/15/2017   Anemia 10/20/2017   Benign essential hypertension 10/20/2017   Constipation 10/20/2017   Controlled type 2 diabetes mellitus with hyperglycemia, without long-term current use of insulin (HCC) 10/20/2017   Generalized osteoarthritis of multiple sites 10/20/2017   Hoarseness or changing voice 10/20/2017   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   Upper extremity weakness 06/16/2017   Left arm weakness 06/16/2017   S/P cervical spinal fusion 04/29/2017   Bilateral lower extremity edema 01/09/2017   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   Bilateral vocal cord paralysis 12/22/2015   Vocal cord paralysis, unilateral complete 12/22/2015   Oropharyngeal dysphagia 12/02/2015   Low vitamin D level 11/19/2015   Elevated troponin level 11/19/2015   Hypertension 11/14/2015   Type 2 diabetes mellitus, without long-term current use of insulin (HCC) 11/14/2015   Type 2 diabetes mellitus (HCC) 11/14/2015   BPH (benign prostatic hyperplasia) 06/14/2014   Epididymitis, right 05/17/2014  Hematuria 05/17/2014   Swollen testicle 05/17/2014   Testis mass 05/17/2014   Gross hematuria 10/19/2013   Central cord syndrome (HCC) 10/13/2012    ONSET DATE: 09/28/2022 (MD referral)  REFERRING DIAG: Z74.09 (ICD-10-CM) - Poor mobility   THERAPY DIAG:  Muscle weakness (generalized)  Unsteadiness on feet  Other abnormalities of gait and mobility  Rationale for Evaluation and Treatment: Rehabilitation  SUBJECTIVE:                                                                                                                                                                                              SUBJECTIVE STATEMENT: Apologizes for being late.   Pt accompanied by: self  PERTINENT HISTORY: PMH of HTN, DM 2, COPD/asthma, cervical spine fusion, constipation, peripheral edema, OSA, vitamin D deficiency, gout who presents for hospital follow-up. Was seen in the emergency room on 06/24/2022 with shortness of breath, AKI/dehydration.   PAIN:  Are you having pain? Yes: NPRS scale: 4-5/10 Pain location: L knee Pain description: occasional Aggravating factors: unsure Relieving factors: cream  PRECAUTIONS: Fall  RED FLAGS: None   WEIGHT BEARING RESTRICTIONS: No  FALLS: Has patient fallen in last 6 months? Yes. Number of falls 1  LIVING ENVIRONMENT: Lives with: lives alone Lives in: House/apartment Stairs: Ramp Has following equipment at home: Single point cane  PLOF: Independent with household mobility with device and Independent with community mobility with device Has gone to [employer] in the past (>1 year); works some as Curator PATIENT GOALS: Get better leg strength  OBJECTIVE:    TODAY'S TREATMENT: 11/10/22 Activity Comments  review of HEP updates:  Standing Hip Abduction with Counter Support 10x each  Standing Hip Extension with Counter Support 10x each  Alternating Step Taps with Counter Support 10x   Cues to look straight ahead for improved posture   alt side step 2x10x Able to reduce to no UE support   alt posterior step 10x Able to reduce to no UE support   romberg EO/EC 2x30" Unable to get feet fully together but good stability   Sidestepping over beanbags  Heavy UE support on II bars , especially with L LE stabilizing          HOME EXERCISE PROGRAM: Access Code: [email or web address] URL: [email or web address] Date: 11/10/2022 Prepared by: [facility]  Exercises - Seated March  - 1 x daily - 7 x weekly - 3 sets - 10 reps - Seated Long Arc Quad  - 1 x daily - 7 x weekly - 3 sets - 10 reps - Seated Ankle Dorsiflexion AROM  -  1 x daily - 7 x weekly - 3 sets - 10 reps - Seated Hamstring Stretch  - 1 x daily - 7 x weekly - 1 sets - 3 reps - 30 sec hold - Seated Hip Adduction Isometrics with Ball  - 1 x daily - 7 x weekly - 3 sets - 10 reps - 3 sec hold - Standing Gastroc Stretch at Counter  - 1 x daily - 7 x weekly - 1 sets - 3 reps - 30 sec hold - Mini Squat with Counter Support  - 1 x daily - 7 x weekly - 3 sets - 3 reps - Standing Hip Abduction with Counter Support  - 1 x daily - 7 x weekly - 3 sets - 10 reps - Standing Hip Extension with Counter Support  - 1 x daily - 7 x weekly - 3 sets - 10 reps - Alternating Step Taps with Counter Support  - 1 x daily - 7 x weekly - 3 sets - 10 reps - Side Step Overs with Cones and Unilateral Counter Support  - 1 x daily - 5 x weekly - 2 sets - 10 reps    PATIENT EDUCATION: Education details: HEP update with edu on safety  Person educated: Patient Education method: Explanation, Demonstration, Tactile cues, Verbal cues, and Handouts Education comprehension: verbalized understanding and returned demonstration '    --------------------------------------------------------------- Note: Objective measures below were completed at Evaluation unless otherwise noted.  DIAGNOSTIC FINDINGS: NA for this episode  COGNITION: Overall cognitive status: Within functional limits for tasks assessed   SENSATION: Light touch: WFL  EDEMA:  BLE edema noted appears similar to lymphedema; reports he has had BLEs wrapped in the past and plans to have this done coming up.  POSTURE: rounded shoulders, forward head, posterior pelvic tilt, and flexed trunk   LOWER EXTREMITY ROM:   full ankle ROM limited due to edema at ankles/feet    LOWER EXTREMITY MMT:    MMT Right Eval Left Eval  Hip flexion  3+ 3+  Hip extension    Hip abduction 4 4  Hip adduction 4 4  Hip internal rotation    Hip external rotation    Knee flexion 4+ 4+  Knee extension 4 4  Ankle dorsiflexion 4 4  Ankle plantarflexion    Ankle inversion    Ankle eversion    (Blank rows = not tested)    TRANSFERS: Assistive device utilized: None  Sit to stand: SBA BUE support due to knee pain Stand to sit: SBA BUE support   GAIT: Gait pattern:  LLE externally rotated, step through pattern, poor foot clearance- Right, and poor foot clearance- Left Distance walked: 170 ft Assistive device utilized: Single point cane Level of assistance: SBA Comments: forward flexed posture  FUNCTIONAL TESTS:  5 times sit to stand: 17.63 sec with BUE support Timed up and go (TUG): 21.4 sec 2 minute walk test: 166 ft 10 meter walk test: 23.09 sec ( 1.42 ft/sec)   TODAY'S TREATMENT:  DATE: 10/20/2022    PATIENT EDUCATION: Education details: Eval results, POC, HEP  Person educated: Patient Education method: Explanation and Handouts Education comprehension: verbalized understanding  HOME EXERCISE PROGRAM: Access Code: [email or web address] URL: [email or web address] Date: 10/20/2022 Prepared by: [facility] - [facility]  Exercises - Seated March  - 1 x daily - 7 x weekly - 3 sets - 10 reps - Seated Long Arc Quad  - 1 x daily - 7 x weekly - 3 sets - 10 reps - Seated Ankle Dorsiflexion AROM  - 1 x daily - 7 x weekly - 3 sets - 10 reps  GOALS: Goals reviewed with patient? Yes  SHORT TERM GOALS: Target date: 11/20/2022  Pt will be independent with HEP for improved strength, balance, gait. Baseline: Goal status: INITIAL  2.  Pt will improve 5x sit<>stand to less than or equal to 12 sec to demonstrate improved functional strength and transfer efficiency. Baseline: 17.63 sec  BUE support Goal status: INITIAL  3.  Pt will improve TUG score to less than or equal to 17 sec for decreased fall risk. Baseline: 21.63 sec Goal status: INITIAL  LONG TERM GOALS: Target date: 12/04/2022  Pt will be independent with HEP for improved balance, strength, gait. Baseline:  Goal status: INITIAL  2.  Pt will improve gait velocity to at least 1.8 ft/sec for improved gait efficiency and safety. Baseline: 1.42 ft/sec Goal status: INITIAL  3.  Pt will improve TUG score to less than or equal to 13 sec for decreased fall risk. Baseline: 21.4 sec Goal status: INITIAL  4.  Pt will ambulate 200 ft in 2 minute walk for improved gait endurance and efficiency. Baseline:  Goal status: INITIAL  5.  Pt will verbalize plans for continued community fitness upon d/c from PT. Baseline:  Goal status: INITIAL  ASSESSMENT:  CLINICAL IMPRESSION: Patient arrived to session without new complaints. Reviewed HEP update which revealed good understanding and carryover. Proceeded with progression of balance activities with focus on stepping strategy and stepping over obstacles. Demonstrated good reduction in UE support with stepping however did have more reliance on UEs with stepping over tasks. Patient reported understanding and without complaints upon leaving.   OBJECTIVE IMPAIRMENTS: Abnormal gait, decreased balance, decreased mobility, difficulty walking, decreased strength, impaired flexibility, and postural dysfunction.   ACTIVITY LIMITATIONS: standing, transfers, and locomotion level  PARTICIPATION LIMITATIONS: shopping, community activity, and occupation  PERSONAL FACTORS: 3+ comorbidities: see PMH  are also affecting patient's functional outcome.   REHAB POTENTIAL: Good  CLINICAL DECISION MAKING: Evolving/moderate complexity  EVALUATION COMPLEXITY: Moderate  PLAN:  PT FREQUENCY: 1-2x/week  PT DURATION: 6 weeks  PLANNED INTERVENTIONS: 97110-Therapeutic exercises, 97530-  Therapeutic activity, 97112- Neuromuscular re-education, 97535- Self Care, [id number]- Manual therapy, [phone]- Gait training, [phone]- Orthotic Fit/training, Patient/Family education, Balance training, Therapeutic exercises, Therapeutic activity, Neuromuscular re-education, Gait training, and Self Care  PLAN FOR NEXT SESSION: Review HEP additions and continue to progress for BLE strengthening, standing balance, gait activities.  Pt to bring in picture of his stepper at home and would benefit from education on more consistent use.   Anette Guarneri, PT, DPT 11/10/22 1:59 PM  [facility] at [facility] [address] [place], [address] [place], [place] [address] Phone # [phone] Fax # [phone]

## 2022-11-10 ENCOUNTER — Ambulatory Visit: Payer: 59 | Admitting: Physical Therapy

## 2022-11-10 ENCOUNTER — Encounter: Payer: Self-pay | Admitting: Physical Therapy

## 2022-11-10 DIAGNOSIS — M6281 Muscle weakness (generalized): Secondary | ICD-10-CM | POA: Diagnosis not present

## 2022-11-10 DIAGNOSIS — R2681 Unsteadiness on feet: Secondary | ICD-10-CM

## 2022-11-10 DIAGNOSIS — R2689 Other abnormalities of gait and mobility: Secondary | ICD-10-CM

## 2022-11-12 ENCOUNTER — Ambulatory Visit: Payer: 59 | Admitting: Physical Therapy

## 2022-11-12 ENCOUNTER — Encounter: Payer: Self-pay | Admitting: Physical Therapy

## 2022-11-12 DIAGNOSIS — M6281 Muscle weakness (generalized): Secondary | ICD-10-CM

## 2022-11-12 DIAGNOSIS — R2689 Other abnormalities of gait and mobility: Secondary | ICD-10-CM

## 2022-11-12 DIAGNOSIS — R2681 Unsteadiness on feet: Secondary | ICD-10-CM

## 2022-11-12 NOTE — Therapy (Signed)
OUTPATIENT PHYSICAL THERAPY NEURO TREATMENT NOTE   Patient Name: Dale Key MRN: [id number] DOB:March 30, 1955, 67 y.o., adult Today's Date: 11/12/2022   PCP: Barbette Reichmann, MD REFERRING PROVIDER: Barbette Reichmann, MD  END OF SESSION:  PT End of Session - 11/12/22 1401     Visit Number 5    Number of Visits 13    Date for PT Re-Evaluation 12/04/22    Authorization Type [employer] Medicare    Progress Note Due on Visit 10    PT Start Time 1406    PT Stop Time 1445    PT Time Calculation (min) 39 min    Activity Tolerance Patient tolerated treatment well    Behavior During Therapy WFL for tasks assessed/performed                Past Medical History:  Diagnosis Date   Diabetes mellitus without complication (HCC)    Glaucoma    Hyperlipidemia    Hypertension    Tracheostomy present [facility])    Past Surgical History:  Procedure Laterality Date   ANTERIOR CERVICAL DECOMP/DISCECTOMY FUSION N/A 10/07/2012   Procedure: ANTERIOR CERVICAL DECOMPRESSION/DISCECTOMY FUSION 2 LEVELS;  Surgeon: Karn Cassis, MD;  Location: [facility] OR;  Service: Neurosurgery;  Laterality: N/A;  Anterior Cervical Three-Four/Four-Five Decompression and Fusion   EYE SURGERY     FOOT SURGERY Left    Pt states a tendon was removed to replace one in right hand.   HAND SURGERY Right    TRACHEOSTOMY     Patient Active Problem List   Diagnosis Date Noted   Age-related nuclear cataract of both eyes 10/01/2021   Primary open angle glaucoma of left eye, mild stage 10/01/2021   Abnormal weight loss 08/05/2020   Chronic idiopathic constipation 08/05/2020   Colon cancer screening 08/05/2020   Gastroesophageal reflux disease 08/05/2020   Low back pain 08/05/2020   Morbid obesity (HCC) 08/05/2020   History of colonic polyps 08/05/2020   Dyspnea on effort 07/30/2020   Thyroid nodule 11/13/2019   Lymphadenopathy 11/06/2019   Glaucoma of right eye 05/11/2019   Moderate nonproliferative diabetic retinopathy of  both eyes without macular edema associated with type 2 diabetes mellitus (HCC) 05/11/2019   Nuclear sclerotic cataract of both eyes 05/11/2019   OSA on CPAP 05/08/2019   Poor mobility 06/24/2018   Urinary tract infection due to ESBL Klebsiella 06/08/2018   Recurrent UTI 05/10/2018   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/10/2018   BMI 40.0-44.9, adult (HCC) 11/25/2017   Cervical disc disease with myelopathy 11/15/2017   Anemia 10/20/2017   Benign essential hypertension 10/20/2017   Constipation 10/20/2017   Controlled type 2 diabetes mellitus with hyperglycemia, without long-term current use of insulin (HCC) 10/20/2017   Generalized osteoarthritis of multiple sites 10/20/2017   Hoarseness or changing voice 10/20/2017   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   Upper extremity weakness 06/16/2017   Left arm weakness 06/16/2017   S/P cervical spinal fusion 04/29/2017   Bilateral lower extremity edema 01/09/2017   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   Bilateral vocal cord paralysis 12/22/2015   Vocal cord paralysis, unilateral complete 12/22/2015   Oropharyngeal dysphagia 12/02/2015   Low vitamin D level 11/19/2015   Elevated troponin level 11/19/2015   Hypertension 11/14/2015   Type 2 diabetes mellitus, without long-term current use of insulin (HCC) 11/14/2015   Type 2 diabetes mellitus (HCC) 11/14/2015   BPH (benign prostatic hyperplasia) 06/14/2014   Epididymitis, right 05/17/2014   Hematuria 05/17/2014  Swollen testicle 05/17/2014   Testis mass 05/17/2014   Gross hematuria 10/19/2013   Central cord syndrome (HCC) 10/13/2012    ONSET DATE: 09/28/2022 (MD referral)  REFERRING DIAG: Z74.09 (ICD-10-CM) - Poor mobility   THERAPY DIAG:  Muscle weakness (generalized)  Unsteadiness on feet  Other abnormalities of gait and mobility  Rationale for Evaluation and Treatment: Rehabilitation  SUBJECTIVE:                                                                                                                                                                                              SUBJECTIVE STATEMENT: Nothing new today.  Pt accompanied by: self  PERTINENT HISTORY: PMH of HTN, DM 2, COPD/asthma, cervical spine fusion, constipation, peripheral edema, OSA, vitamin D deficiency, gout who presents for hospital follow-up. Was seen in the emergency room on 06/24/2022 with shortness of breath, AKI/dehydration.   PAIN:  Are you having pain? Yes: NPRS scale: 4-5/10 Pain location: L knee Pain description: occasional Aggravating factors: unsure Relieving factors: cream  PRECAUTIONS: Fall  RED FLAGS: None   WEIGHT BEARING RESTRICTIONS: No  FALLS: Has patient fallen in last 6 months? Yes. Number of falls 1  LIVING ENVIRONMENT: Lives with: lives alone Lives in: House/apartment Stairs: Ramp Has following equipment at home: Single point cane  PLOF: Independent with household mobility with device and Independent with community mobility with device Has gone to [employer] in the past (>1 year); works some as Curator PATIENT GOALS: Get better leg strength  OBJECTIVE:    TODAY'S TREATMENT: 11/12/2022 Activity Comments  Sit<>stand, 3 x 5 reps Cues for eccentric control  Reviewed step taps and side step over obstacle Pt reports L knee pain, so modified with single leg step out and in  Forward step over obstacles, 10 reps LLE as standing-removed obstacle  Forward/back walking 1 min in parallel bars  Sidestepping R and L in parallel bars  2 minutes   LAQ 2 x 10 BLE 2#  Resisted hamstring curls 2 x 10, BLE Green theraband  Minisquats x 10, then mini squats with lateral weightshift          HOME EXERCISE PROGRAM: Access Code: [email or web address] URL: [email or web address] Date: 11/10/2022 Prepared by: [facility] - Outpatient  Rehab - [facility]  Exercises - Seated March  - 1 x daily - 7 x  weekly - 3 sets - 10 reps - Seated Long Arc Quad  - 1 x daily - 7 x weekly - 3 sets - 10 reps - Seated Ankle Dorsiflexion AROM  - 1 x daily - 7 x weekly - 3 sets - 10 reps -  Seated Hamstring Stretch  - 1 x daily - 7 x weekly - 1 sets - 3 reps - 30 sec hold - Seated Hip Adduction Isometrics with Ball  - 1 x daily - 7 x weekly - 3 sets - 10 reps - 3 sec hold - Standing Gastroc Stretch at Counter  - 1 x daily - 7 x weekly - 1 sets - 3 reps - 30 sec hold - Mini Squat with Counter Support  - 1 x daily - 7 x weekly - 3 sets - 3 reps - Standing Hip Abduction with Counter Support  - 1 x daily - 7 x weekly - 3 sets - 10 reps - Standing Hip Extension with Counter Support  - 1 x daily - 7 x weekly - 3 sets - 10 reps - Alternating Step Taps with Counter Support  - 1 x daily - 7 x weekly - 3 sets - 10 reps - Side Step Overs with Cones and Unilateral Counter Support  - 1 x daily - 5 x weekly - 2 sets - 10 reps    PATIENT EDUCATION: Education details: HEP update with edu on safety  Person educated: Patient Education method: Explanation, Demonstration, Tactile cues, Verbal cues, and Handouts Education comprehension: verbalized understanding and returned demonstration '    --------------------------------------------------------------- Note: Objective measures below were completed at Evaluation unless otherwise noted.  DIAGNOSTIC FINDINGS: NA for this episode  COGNITION: Overall cognitive status: Within functional limits for tasks assessed   SENSATION: Light touch: WFL  EDEMA:  BLE edema noted appears similar to lymphedema; reports he has had BLEs wrapped in the past and plans to have this done coming up.  POSTURE: rounded shoulders, forward head, posterior pelvic tilt, and flexed trunk   LOWER EXTREMITY ROM:   full ankle ROM limited due to edema at ankles/feet    LOWER EXTREMITY MMT:    MMT Right Eval Left Eval  Hip flexion 3+ 3+  Hip extension    Hip abduction 4 4  Hip adduction 4  4  Hip internal rotation    Hip external rotation    Knee flexion 4+ 4+  Knee extension 4 4  Ankle dorsiflexion 4 4  Ankle plantarflexion    Ankle inversion    Ankle eversion    (Blank rows = not tested)    TRANSFERS: Assistive device utilized: None  Sit to stand: SBA BUE support due to knee pain Stand to sit: SBA BUE support   GAIT: Gait pattern:  LLE externally rotated, step through pattern, poor foot clearance- Right, and poor foot clearance- Left Distance walked: 170 ft Assistive device utilized: Single point cane Level of assistance: SBA Comments: forward flexed posture  FUNCTIONAL TESTS:  5 times sit to stand: 17.63 sec with BUE support Timed up and go (TUG): 21.4 sec 2 minute walk test: 166 ft 10 meter walk test: 23.09 sec ( 1.42 ft/sec)   TODAY'S TREATMENT:  DATE: 10/20/2022    PATIENT EDUCATION: Education details: Eval results, POC, HEP  Person educated: Patient Education method: Explanation and Handouts Education comprehension: verbalized understanding  HOME EXERCISE PROGRAM: Access Code: [email or web address] URL: [email or web address] Date: 10/20/2022 Prepared by: [facility] - Outpatient  Rehab - [facility]  Exercises - Seated March  - 1 x daily - 7 x weekly - 3 sets - 10 reps - Seated Long Arc Quad  - 1 x daily - 7 x weekly - 3 sets - 10 reps - Seated Ankle Dorsiflexion AROM  - 1 x daily - 7 x weekly - 3 sets - 10 reps  GOALS: Goals reviewed with patient? Yes  SHORT TERM GOALS: Target date: 11/20/2022  Pt will be independent with HEP for improved strength, balance, gait. Baseline: Goal status: IN PROGRESS  2.  Pt will improve 5x sit<>stand to less than or equal to 12 sec to demonstrate improved functional strength and transfer efficiency. Baseline: 17.63 sec BUE support Goal status: IN PROGRESS  3.  Pt will  improve TUG score to less than or equal to 17 sec for decreased fall risk. Baseline: 21.63 sec Goal status: IN PROGRESS  LONG TERM GOALS: Target date: 12/04/2022  Pt will be independent with HEP for improved balance, strength, gait. Baseline:  Goal status: IN PROGRESS  2.  Pt will improve gait velocity to at least 1.8 ft/sec for improved gait efficiency and safety. Baseline: 1.42 ft/sec Goal status: IN PROGRESS  3.  Pt will improve TUG score to less than or equal to 13 sec for decreased fall risk. Baseline: 21.4 sec Goal status: IN PROGRESS  4.  Pt will ambulate 200 ft in 2 minute walk for improved gait endurance and efficiency. Baseline:  Goal status: IN PROGRESS  5.  Pt will verbalize plans for continued community fitness upon d/c from PT. Baseline:  Goal status: IN PROGRESS  ASSESSMENT:  CLINICAL IMPRESSION: Skilled PT session focused on strengthening exercises.  Reviewed HEP additions from last visit; pt's L knee demonstrates decreased stability with L step over from obstacle, so worked to modify this exercise for performing with one leg stepping over, not both.  Worked in standing to address lateral weigthshifting for more equal step length, which pt is able to sustain with BUE support.  However, with 1 UE support with cane, he demo decreased step length, decreased SLS time.  He will continue to benefit from skilled PT towards goals for improved functional mobility and decreased fall risk.  OBJECTIVE IMPAIRMENTS: Abnormal gait, decreased balance, decreased mobility, difficulty walking, decreased strength, impaired flexibility, and postural dysfunction.   ACTIVITY LIMITATIONS: standing, transfers, and locomotion level  PARTICIPATION LIMITATIONS: shopping, community activity, and occupation  PERSONAL FACTORS: 3+ comorbidities: see PMH  are also affecting patient's functional outcome.   REHAB POTENTIAL: Good  CLINICAL DECISION MAKING: Evolving/moderate  complexity  EVALUATION COMPLEXITY: Moderate  PLAN:  PT FREQUENCY: 1-2x/week  PT DURATION: 6 weeks  PLANNED INTERVENTIONS: 97110-Therapeutic exercises, 97530- Therapeutic activity, 97112- Neuromuscular re-education, 97535- Self Care, [id number]- Manual therapy, [phone]- Gait training, [phone]- Orthotic Fit/training, Patient/Family education, Balance training, Therapeutic exercises, Therapeutic activity, Neuromuscular re-education, Gait training, and Self Care  PLAN FOR NEXT SESSION: Check STGs.  Continue to progress for BLE strengthening, standing balance, gait activities.  Pt to bring in picture of his stepper at home and would benefit from education on more consistent use.   Lonia Blood, PT 11/12/22 2:58 PM Phone: [phone] Fax: [phone]  [facility] [address], [address] [place], [place] [address] Phone # [phone] Fax # [phone]

## 2022-11-17 ENCOUNTER — Ambulatory Visit: Payer: 59 | Attending: Internal Medicine | Admitting: Physical Therapy

## 2022-11-17 ENCOUNTER — Encounter: Payer: Self-pay | Admitting: Physical Therapy

## 2022-11-17 DIAGNOSIS — R2689 Other abnormalities of gait and mobility: Secondary | ICD-10-CM | POA: Insufficient documentation

## 2022-11-17 DIAGNOSIS — M6281 Muscle weakness (generalized): Secondary | ICD-10-CM | POA: Diagnosis present

## 2022-11-17 DIAGNOSIS — R2681 Unsteadiness on feet: Secondary | ICD-10-CM | POA: Diagnosis present

## 2022-11-17 NOTE — Therapy (Signed)
OUTPATIENT PHYSICAL THERAPY NEURO TREATMENT NOTE   Patient Name: Dale Key MRN: [id number] DOB:Jan 16, 1955, 67 y.o., adult Today's Date: 11/17/2022   PCP: Barbette Reichmann, MD REFERRING PROVIDER: Barbette Reichmann, MD  END OF SESSION:  PT End of Session - 11/17/22 1405     Visit Number 6    Number of Visits 13    Date for PT Re-Evaluation 12/04/22    Authorization Type [employer] Medicare    Progress Note Due on Visit 10    PT Start Time 1405    PT Stop Time 1445    PT Time Calculation (min) 40 min    Activity Tolerance Patient tolerated treatment well    Behavior During Therapy WFL for tasks assessed/performed                 Past Medical History:  Diagnosis Date   Diabetes mellitus without complication (HCC)    Glaucoma    Hyperlipidemia    Hypertension    Tracheostomy present [facility])    Past Surgical History:  Procedure Laterality Date   ANTERIOR CERVICAL DECOMP/DISCECTOMY FUSION N/A 10/07/2012   Procedure: ANTERIOR CERVICAL DECOMPRESSION/DISCECTOMY FUSION 2 LEVELS;  Surgeon: Karn Cassis, MD;  Location: [facility] OR;  Service: Neurosurgery;  Laterality: N/A;  Anterior Cervical Three-Four/Four-Five Decompression and Fusion   EYE SURGERY     FOOT SURGERY Left    Pt states a tendon was removed to replace one in right hand.   HAND SURGERY Right    TRACHEOSTOMY     Patient Active Problem List   Diagnosis Date Noted   Age-related nuclear cataract of both eyes 10/01/2021   Primary open angle glaucoma of left eye, mild stage 10/01/2021   Abnormal weight loss 08/05/2020   Chronic idiopathic constipation 08/05/2020   Colon cancer screening 08/05/2020   Gastroesophageal reflux disease 08/05/2020   Low back pain 08/05/2020   Morbid obesity (HCC) 08/05/2020   History of colonic polyps 08/05/2020   Dyspnea on effort 07/30/2020   Thyroid nodule 11/13/2019   Lymphadenopathy 11/06/2019   Glaucoma of right eye 05/11/2019   Moderate nonproliferative diabetic retinopathy of  both eyes without macular edema associated with type 2 diabetes mellitus (HCC) 05/11/2019   Nuclear sclerotic cataract of both eyes 05/11/2019   OSA on CPAP 05/08/2019   Poor mobility 06/24/2018   Urinary tract infection due to ESBL Klebsiella 06/08/2018   Recurrent UTI 05/10/2018   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/10/2018   BMI 40.0-44.9, adult (HCC) 11/25/2017   Cervical disc disease with myelopathy 11/15/2017   Anemia 10/20/2017   Benign essential hypertension 10/20/2017   Constipation 10/20/2017   Controlled type 2 diabetes mellitus with hyperglycemia, without long-term current use of insulin (HCC) 10/20/2017   Generalized osteoarthritis of multiple sites 10/20/2017   Hoarseness or changing voice 10/20/2017   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   Upper extremity weakness 06/16/2017   Left arm weakness 06/16/2017   S/P cervical spinal fusion 04/29/2017   Bilateral lower extremity edema 01/09/2017   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   Bilateral vocal cord paralysis 12/22/2015   Vocal cord paralysis, unilateral complete 12/22/2015   Oropharyngeal dysphagia 12/02/2015   Low vitamin D level 11/19/2015   Elevated troponin level 11/19/2015   Hypertension 11/14/2015   Type 2 diabetes mellitus, without long-term current use of insulin (HCC) 11/14/2015   Type 2 diabetes mellitus (HCC) 11/14/2015   BPH (benign prostatic hyperplasia) 06/14/2014   Epididymitis, right 05/17/2014   Hematuria 05/17/2014  Swollen testicle 05/17/2014   Testis mass 05/17/2014   Gross hematuria 10/19/2013   Central cord syndrome (HCC) 10/13/2012    ONSET DATE: 09/28/2022 (MD referral)  REFERRING DIAG: Z74.09 (ICD-10-CM) - Poor mobility   THERAPY DIAG:  Muscle weakness (generalized)  Unsteadiness on feet  Other abnormalities of gait and mobility  Rationale for Evaluation and Treatment: Rehabilitation  SUBJECTIVE:                                                                                                                                                                                              SUBJECTIVE STATEMENT: Moving a little better, due to less swelling.  Working out (doing exercises at home).    Pt accompanied by: self  PERTINENT HISTORY: PMH of HTN, DM 2, COPD/asthma, cervical spine fusion, constipation, peripheral edema, OSA, vitamin D deficiency, gout who presents for hospital follow-up. Was seen in the emergency room on 06/24/2022 with shortness of breath, AKI/dehydration.   PAIN:  Are you having pain? Yes: NPRS scale: 4-5/10 Pain location: L knee Pain description: occasional Aggravating factors: unsure Relieving factors: cream  PRECAUTIONS: Fall  RED FLAGS: None   WEIGHT BEARING RESTRICTIONS: No  FALLS: Has patient fallen in last 6 months? Yes. Number of falls 1  LIVING ENVIRONMENT: Lives with: lives alone Lives in: House/apartment Stairs: Ramp Has following equipment at home: Single point cane  PLOF: Independent with household mobility with device and Independent with community mobility with device Has gone to [employer] in the past (>1 year); works some as Curator PATIENT GOALS: Get better leg strength  OBJECTIVE:    TODAY'S TREATMENT: 11/17/2022 Activity Comments  NuStep, Level 4, 4 extremities x 8 minutes for strengthening flexibility SPM 55-60  FTSTS:  16.91 sec with UE support, then 13.75 sec with UE support FTSTS hands at knees:  16.37 sec min guard Improved from eval 17.63 sec BUE support  TUG:  20.31 sec Improved from 21.4 sec at eval  Marching in place 2 x 10  Target for high knees  Forward kicks 2 x 10 Target for high kick  Forward/backwalking in parallel bars   Forward step over low hurdle 2 x 5 reps each leg Decreased foot clearance several times  Gait 2 minutes with cane:  155 ft Min guard, good foot clearance      HOME EXERCISE PROGRAM: Access Code: [email or web address] URL:  [email or web address] Date: 11/10/2022 Prepared by: [facility] - Outpatient  Rehab - [facility]  Exercises - Seated March  - 1 x daily - 7 x weekly - 3 sets - 10 reps - Seated Long Arc Quad  -  1 x daily - 7 x weekly - 3 sets - 10 reps - Seated Ankle Dorsiflexion AROM  - 1 x daily - 7 x weekly - 3 sets - 10 reps - Seated Hamstring Stretch  - 1 x daily - 7 x weekly - 1 sets - 3 reps - 30 sec hold - Seated Hip Adduction Isometrics with Ball  - 1 x daily - 7 x weekly - 3 sets - 10 reps - 3 sec hold - Standing Gastroc Stretch at Counter  - 1 x daily - 7 x weekly - 1 sets - 3 reps - 30 sec hold - Mini Squat with Counter Support  - 1 x daily - 7 x weekly - 3 sets - 3 reps - Standing Hip Abduction with Counter Support  - 1 x daily - 7 x weekly - 3 sets - 10 reps - Standing Hip Extension with Counter Support  - 1 x daily - 7 x weekly - 3 sets - 10 reps - Alternating Step Taps with Counter Support  - 1 x daily - 7 x weekly - 3 sets - 10 reps - Side Step Overs with Cones and Unilateral Counter Support  - 1 x daily - 5 x weekly - 2 sets - 10 reps    PATIENT EDUCATION: Education details: HEP update with edu on safety  Person educated: Patient Education method: Explanation, Demonstration, Tactile cues, Verbal cues, and Handouts Education comprehension: verbalized understanding and returned demonstration     --------------------------------------------------------------- Note: Objective measures below were completed at Evaluation unless otherwise noted.  DIAGNOSTIC FINDINGS: NA for this episode  COGNITION: Overall cognitive status: Within functional limits for tasks assessed   SENSATION: Light touch: WFL  EDEMA:  BLE edema noted appears similar to lymphedema; reports he has had BLEs wrapped in the past and plans to have this done coming up.  POSTURE: rounded shoulders, forward head, posterior pelvic tilt, and flexed trunk   LOWER EXTREMITY ROM:   full ankle ROM limited  due to edema at ankles/feet    LOWER EXTREMITY MMT:    MMT Right Eval Left Eval  Hip flexion 3+ 3+  Hip extension    Hip abduction 4 4  Hip adduction 4 4  Hip internal rotation    Hip external rotation    Knee flexion 4+ 4+  Knee extension 4 4  Ankle dorsiflexion 4 4  Ankle plantarflexion    Ankle inversion    Ankle eversion    (Blank rows = not tested)    TRANSFERS: Assistive device utilized: None  Sit to stand: SBA BUE support due to knee pain Stand to sit: SBA BUE support   GAIT: Gait pattern:  LLE externally rotated, step through pattern, poor foot clearance- Right, and poor foot clearance- Left Distance walked: 170 ft Assistive device utilized: Single point cane Level of assistance: SBA Comments: forward flexed posture  FUNCTIONAL TESTS:  5 times sit to stand: 17.63 sec with BUE support Timed up and go (TUG): 21.4 sec 2 minute walk test: 166 ft 10 meter walk test: 23.09 sec ( 1.42 ft/sec)   TODAY'S TREATMENT:  DATE: 10/20/2022    PATIENT EDUCATION: Education details: Eval results, POC, HEP  Person educated: Patient Education method: Explanation and Handouts Education comprehension: verbalized understanding  HOME EXERCISE PROGRAM: Access Code: [email or web address] URL: [email or web address] Date: 10/20/2022 Prepared by: [facility] - Outpatient  Rehab - [facility]  Exercises - Seated March  - 1 x daily - 7 x weekly - 3 sets - 10 reps - Seated Long Arc Quad  - 1 x daily - 7 x weekly - 3 sets - 10 reps - Seated Ankle Dorsiflexion AROM  - 1 x daily - 7 x weekly - 3 sets - 10 reps  GOALS: Goals reviewed with patient? Yes  SHORT TERM GOALS: Target date: 11/20/2022  Pt will be independent with HEP for improved strength, balance, gait. Baseline: Goal status: IN PROGRESS  2.  Pt will improve 5x sit<>stand to less than or  equal to 12 sec to demonstrate improved functional strength and transfer efficiency. Baseline: 17.63 sec BUE support>13.75 sec 11/17/2022 Goal status: PARTIALLY MET 11/17/2022  3.  Pt will improve TUG score to less than or equal to 17 sec for decreased fall risk. Baseline: 21.63 sec>20.31 sec 11/17/2022 Goal status: IN PROGRESS  LONG TERM GOALS: Target date: 12/04/2022  Pt will be independent with HEP for improved balance, strength, gait. Baseline:  Goal status: IN PROGRESS  2.  Pt will improve gait velocity to at least 1.8 ft/sec for improved gait efficiency and safety. Baseline: 1.42 ft/sec Goal status: IN PROGRESS  3.  Pt will improve TUG score to less than or equal to 13 sec for decreased fall risk. Baseline: 21.4 sec Goal status: IN PROGRESS  4.  Pt will ambulate 200 ft in 2 minute walk for improved gait endurance and efficiency. Baseline:  Goal status: IN PROGRESS  5.  Pt will verbalize plans for continued community fitness upon d/c from PT. Baseline:  Goal status: IN PROGRESS  ASSESSMENT:  CLINICAL IMPRESSION: Began looking at STGs this visit, with pt making some improvements in FTSTS and TUG score.  He is demo improved functional strength with sit to stand with UE support as well as without UE support (hands at knees).   Pt reports he is having difficulty with foot clearance stepping high into his vehicle, so worked in standing with hip flexion motion and hip extension for flexibility.  He does ambulate with better foot clearance after this repeated practice for hip flexion motion.  He will continue to benefit from skilled PT towards goals for improved functional mobility and decreased fall risk.  OBJECTIVE IMPAIRMENTS: Abnormal gait, decreased balance, decreased mobility, difficulty walking, decreased strength, impaired flexibility, and postural dysfunction.   ACTIVITY LIMITATIONS: standing, transfers, and locomotion level  PARTICIPATION LIMITATIONS: shopping, community  activity, and occupation  PERSONAL FACTORS: 3+ comorbidities: see PMH  are also affecting patient's functional outcome.   REHAB POTENTIAL: Good  CLINICAL DECISION MAKING: Evolving/moderate complexity  EVALUATION COMPLEXITY: Moderate  PLAN:  PT FREQUENCY: 1-2x/week  PT DURATION: 6 weeks  PLANNED INTERVENTIONS: 97110-Therapeutic exercises, 97530- Therapeutic activity, 97112- Neuromuscular re-education, 97535- Self Care, 16109- Manual therapy, [phone]- Gait training, [phone]- Orthotic Fit/training, Patient/Family education, Balance training, Therapeutic exercises, Therapeutic activity, Neuromuscular re-education, Gait training, and Self Care  PLAN FOR NEXT SESSION: Check HEP and remaining STGs.  Continue to progress for BLE strengthening (hip/knee flexion), standing balance, gait activities.  Pt to bring in picture of his stepper at home and would benefit from education on more consistent use.   Lonia Blood, PT 11/17/22 2:50  PM Phone: [phone] Fax: [phone]  [facility] at [facility] [address] [place], [place] [address] Phone # [phone] Fax # [phone]

## 2022-11-19 ENCOUNTER — Ambulatory Visit: Payer: 59 | Admitting: Physical Therapy

## 2022-11-23 NOTE — Therapy (Signed)
OUTPATIENT PHYSICAL THERAPY NEURO TREATMENT NOTE   Patient Name: Dale Key MRN: [id number] DOB:April 15, 1955, 67 y.o., adult Today's Date: 11/24/2022   PCP: Barbette Reichmann, MD REFERRING PROVIDER: Barbette Reichmann, MD  END OF SESSION:  PT End of Session - 11/24/22 1626     Visit Number 7    Number of Visits 13    Date for PT Re-Evaluation 12/04/22    Authorization Type [employer] Medicare    Progress Note Due on Visit 10    PT Start Time 1543   pt late   PT Stop Time 1613    PT Time Calculation (min) 30 min    Equipment Utilized During Treatment Gait belt    Activity Tolerance Patient tolerated treatment well    Behavior During Therapy WFL for tasks assessed/performed                  Past Medical History:  Diagnosis Date   Diabetes mellitus without complication (HCC)    Glaucoma    Hyperlipidemia    Hypertension    Tracheostomy present [facility])    Past Surgical History:  Procedure Laterality Date   ANTERIOR CERVICAL DECOMP/DISCECTOMY FUSION N/A 10/07/2012   Procedure: ANTERIOR CERVICAL DECOMPRESSION/DISCECTOMY FUSION 2 LEVELS;  Surgeon: Karn Cassis, MD;  Location: [facility] OR;  Service: Neurosurgery;  Laterality: N/A;  Anterior Cervical Three-Four/Four-Five Decompression and Fusion   EYE SURGERY     FOOT SURGERY Left    Pt states a tendon was removed to replace one in right hand.   HAND SURGERY Right    TRACHEOSTOMY     Patient Active Problem List   Diagnosis Date Noted   Age-related nuclear cataract of both eyes 10/01/2021   Primary open angle glaucoma of left eye, mild stage 10/01/2021   Abnormal weight loss 08/05/2020   Chronic idiopathic constipation 08/05/2020   Colon cancer screening 08/05/2020   Gastroesophageal reflux disease 08/05/2020   Low back pain 08/05/2020   Morbid obesity (HCC) 08/05/2020   History of colonic polyps 08/05/2020   Dyspnea on effort 07/30/2020   Thyroid nodule 11/13/2019   Lymphadenopathy 11/06/2019   Glaucoma of right eye  05/11/2019   Moderate nonproliferative diabetic retinopathy 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 edema associated with type 2 diabetes mellitus (HCC) 05/11/2019   Nuclear sclerotic cataract of both eyes 05/11/2019   OSA on CPAP 05/08/2019   Poor mobility 06/24/2018   Urinary tract infection due to ESBL Klebsiella 06/08/2018   Recurrent UTI 05/10/2018   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/10/2018   BMI 40.0-44.9, adult (HCC) 11/25/2017   Cervical disc disease with myelopathy 11/15/2017   Anemia 10/20/2017   Benign essential hypertension 10/20/2017   Constipation 10/20/2017   Controlled type 2 diabetes mellitus with hyperglycemia, without long-term current use of insulin (HCC) 10/20/2017   Generalized osteoarthritis of multiple sites 10/20/2017   Hoarseness or changing voice 10/20/2017   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   Upper extremity weakness 06/16/2017   Left arm weakness 06/16/2017   S/P cervical spinal fusion 04/29/2017   Bilateral lower extremity edema 01/09/2017   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   Bilateral vocal cord paralysis 12/22/2015   Vocal cord paralysis, unilateral complete 12/22/2015   Oropharyngeal dysphagia 12/02/2015   Low vitamin D level 11/19/2015   Elevated troponin level 11/19/2015   Hypertension 11/14/2015   Type 2 diabetes mellitus, without long-term current use of insulin (HCC) 11/14/2015   Type 2 diabetes mellitus (HCC) 11/14/2015   BPH (  benign prostatic hyperplasia) 06/14/2014   Epididymitis, right 05/17/2014   Hematuria 05/17/2014   Swollen testicle 05/17/2014   Testis mass 05/17/2014   Gross hematuria 10/19/2013   Central cord syndrome (HCC) 10/13/2012    ONSET DATE: 09/28/2022 (MD referral)  REFERRING DIAG: Z74.09 (ICD-10-CM) - Poor mobility   THERAPY DIAG:  Muscle weakness (generalized)  Unsteadiness on feet  Other abnormalities of gait and mobility  Rationale for Evaluation and  Treatment: Rehabilitation  SUBJECTIVE:                                                                                                                                                                                             SUBJECTIVE STATEMENT: Going pretty good today.   Pt accompanied by: self  PERTINENT HISTORY: PMH of HTN, DM 2, COPD/asthma, cervical spine fusion, constipation, peripheral edema, OSA, vitamin D deficiency, gout who presents for hospital follow-up. Was seen in the emergency room on 06/24/2022 with shortness of breath, AKI/dehydration.   PAIN:  Are you having pain? Yes: NPRS scale: 5-6/10 Pain location: L knee Pain description: occasional Aggravating factors: unsure Relieving factors: cream  PRECAUTIONS: Fall  RED FLAGS: None   WEIGHT BEARING RESTRICTIONS: No  FALLS: Has patient fallen in last 6 months? Yes. Number of falls 1  LIVING ENVIRONMENT: Lives with: lives alone Lives in: House/apartment Stairs: Ramp Has following equipment at home: Single point cane  PLOF: Independent with household mobility with device and Independent with community mobility with device Has gone to [employer] in the past (>1 year); works some as Curator PATIENT GOALS: Get better leg strength  OBJECTIVE:     TODAY'S TREATMENT: 11/24/22 Activity Comments  review of HEP: - Seated March 20x  - Seated Long Arc Quad 10x each  - Seated Ankle Dorsiflexion AROM 10x  - Seated Hamstring Stretch 30" each  - Seated Hip Adduction Isometrics with Ball 10x5" - Standing Gastroc Stretch at [employer] 30" each - Mini Squat with Counter Support 10x  - Standing Hip Abduction with Counter Support 10x each - Standing Hip Extension with Counter Support 10x each - Alternating Step Taps with Counter Support 2x10 - Side Step Overs with Cones and Unilateral Counter Support Tendency to lean back with seated exercises d/t HS length. Cueing to extend L knee with gastroc stretch. Cueing for form  with squats, upright posture with hip strengthening. Last 2 exercises appeared to be most challenging.   step ups anterior 5x each 6" B UE support; pt reports "I surprised my self on my left" d/t good performance  PATIENT EDUCATION: Education details: review and discussion with pt on his HEP as he reports not being consistent- encouraged dividing HEP in thirds to improve success  Person educated: Patient Education method: Explanation, Demonstration, Tactile cues, Verbal cues, and Handouts Education comprehension: verbalized understanding and returned demonstration    HOME EXERCISE PROGRAM: Access Code: [email or web address] URL: [email or web address] Date: 11/10/2022 Prepared by: [facility] - Outpatient  Rehab - [facility]  Exercises - Seated March  - 1 x daily - 7 x weekly - 3 sets - 10 reps - Seated Long Arc Quad  - 1 x daily - 7 x weekly - 3 sets - 10 reps - Seated Ankle Dorsiflexion AROM  - 1 x daily - 7 x weekly - 3 sets - 10 reps - Seated Hamstring Stretch  - 1 x daily - 7 x weekly - 1 sets - 3 reps - 30 sec hold - Seated Hip Adduction Isometrics with Ball  - 1 x daily - 7 x weekly - 3 sets - 10 reps - 3 sec hold - Standing Gastroc Stretch at Counter  - 1 x daily - 7 x weekly - 1 sets - 3 reps - 30 sec hold - Mini Squat with Counter Support  - 1 x daily - 7 x weekly - 3 sets - 3 reps - Standing Hip Abduction with Counter Support  - 1 x daily - 7 x weekly - 3 sets - 10 reps - Standing Hip Extension with Counter Support  - 1 x daily - 7 x weekly - 3 sets - 10 reps - Alternating Step Taps with Counter Support  - 1 x daily - 7 x weekly - 3 sets - 10 reps - Side Step Overs with Cones and Unilateral Counter Support  - 1 x daily - 5 x weekly - 2 sets - 10 reps    --------------------------------------------------------------- Note: Objective measures below were completed at Evaluation unless otherwise noted.  DIAGNOSTIC FINDINGS: NA for this  episode  COGNITION: Overall cognitive status: Within functional limits for tasks assessed   SENSATION: Light touch: WFL  EDEMA:  BLE edema noted appears similar to lymphedema; reports he has had BLEs wrapped in the past and plans to have this done coming up.  POSTURE: rounded shoulders, forward head, posterior pelvic tilt, and flexed trunk   LOWER EXTREMITY ROM:   full ankle ROM limited due to edema at ankles/feet    LOWER EXTREMITY MMT:    MMT Right Eval Left Eval  Hip flexion 3+ 3+  Hip extension    Hip abduction 4 4  Hip adduction 4 4  Hip internal rotation    Hip external rotation    Knee flexion 4+ 4+  Knee extension 4 4  Ankle dorsiflexion 4 4  Ankle plantarflexion    Ankle inversion    Ankle eversion    (Blank rows = not tested)    TRANSFERS: Assistive device utilized: None  Sit to stand: SBA BUE support due to knee pain Stand to sit: SBA BUE support   GAIT: Gait pattern:  LLE externally rotated, step through pattern, poor foot clearance- Right, and poor foot clearance- Left Distance walked: 170 ft Assistive device utilized: Single point cane Level of assistance: SBA Comments: forward flexed posture  FUNCTIONAL TESTS:  5 times sit to stand: 17.63 sec with BUE support Timed up and go (TUG): 21.4 sec 2 minute walk test: 166 ft 10 meter walk test: 23.09 sec ( 1.42 ft/sec)   TODAY'S TREATMENT:  DATE: 10/20/2022    PATIENT EDUCATION: Education details: Eval results, POC, HEP  Person educated: Patient Education method: Explanation and Handouts Education comprehension: verbalized understanding  HOME EXERCISE PROGRAM: Access Code: [email or web address] URL: [email or web address] Date: 10/20/2022 Prepared by: [facility] - Outpatient  Rehab - [facility]  Exercises - Seated March  - 1 x daily - 7 x weekly - 3 sets - 10  reps - Seated Long Arc Quad  - 1 x daily - 7 x weekly - 3 sets - 10 reps - Seated Ankle Dorsiflexion AROM  - 1 x daily - 7 x weekly - 3 sets - 10 reps  GOALS: Goals reviewed with patient? Yes  SHORT TERM GOALS: Target date: 11/20/2022  Pt will be independent with HEP for improved strength, balance, gait. Baseline: Goal status: MET 11/24/22  2.  Pt will improve 5x sit<>stand to less than or equal to 12 sec to demonstrate improved functional strength and transfer efficiency. Baseline: 17.63 sec BUE support>13.75 sec 11/17/2022 Goal status: PARTIALLY MET 11/17/2022  3.  Pt will improve TUG score to less than or equal to 17 sec for decreased fall risk. Baseline: 21.63 sec>20.31 sec 11/17/2022 Goal status: IN PROGRESS  LONG TERM GOALS: Target date: 12/04/2022  Pt will be independent with HEP for improved balance, strength, gait. Baseline:  Goal status: IN PROGRESS  2.  Pt will improve gait velocity to at least 1.8 ft/sec for improved gait efficiency and safety. Baseline: 1.42 ft/sec Goal status: IN PROGRESS  3.  Pt will improve TUG score to less than or equal to 13 sec for decreased fall risk. Baseline: 21.4 sec Goal status: IN PROGRESS  4.  Pt will ambulate 200 ft in 2 minute walk for improved gait endurance and efficiency. Baseline:  Goal status: IN PROGRESS  5.  Pt will verbalize plans for continued community fitness upon d/c from PT. Baseline:  Goal status: IN PROGRESS  ASSESSMENT:  CLINICAL IMPRESSION: Patient arrived to session without new complaints. Reviewed HEP in detail and updated for max benefit. Patient with tendency to lean posterior with seated exercises d/t limited HS length.  Standing balance activities requiring stepping over obstacle appeared challenged d/t limited hip flexion ROM vs. weakness. Patient reported surprise at ability to perform step ups with L LE leading. Adjusted HEP to allow for less exercises assigned to him each day to improve success and  compliance. Patient without complaints at end of session.   OBJECTIVE IMPAIRMENTS: Abnormal gait, decreased balance, decreased mobility, difficulty walking, decreased strength, impaired flexibility, and postural dysfunction.   ACTIVITY LIMITATIONS: standing, transfers, and locomotion level  PARTICIPATION LIMITATIONS: shopping, community activity, and occupation  PERSONAL FACTORS: 3+ comorbidities: see PMH  are also affecting patient's functional outcome.   REHAB POTENTIAL: Good  CLINICAL DECISION MAKING: Evolving/moderate complexity  EVALUATION COMPLEXITY: Moderate  PLAN:  PT FREQUENCY: 1-2x/week  PT DURATION: 6 weeks  PLANNED INTERVENTIONS: 97110-Therapeutic exercises, 97530- Therapeutic activity, 97112- Neuromuscular re-education, 97535- Self Care, [id number]- Manual therapy, [phone]- Gait training, [phone]- Orthotic Fit/training, Patient/Family education, Balance training, Therapeutic exercises, Therapeutic activity, Neuromuscular re-education, Gait training, and Self Care  PLAN FOR NEXT SESSION:  Continue to progress for BLE strengthening (hip/knee flexion), standing balance, gait activities.  Pt to bring in picture of his stepper at home and would benefit from education on more consistent use.    Anette Guarneri, PT, DPT 11/24/22 4:28 PM  [facility] at [facility] [address] [place], [address] [place], [place] [address] Phone # [phone]  Fax # [phone]

## 2022-11-24 ENCOUNTER — Ambulatory Visit: Payer: 59 | Admitting: Physical Therapy

## 2022-11-24 ENCOUNTER — Encounter: Payer: Self-pay | Admitting: Physical Therapy

## 2022-11-24 DIAGNOSIS — R2681 Unsteadiness on feet: Secondary | ICD-10-CM

## 2022-11-24 DIAGNOSIS — M6281 Muscle weakness (generalized): Secondary | ICD-10-CM | POA: Diagnosis not present

## 2022-11-24 DIAGNOSIS — R2689 Other abnormalities of gait and mobility: Secondary | ICD-10-CM

## 2022-11-26 ENCOUNTER — Ambulatory Visit: Payer: 59 | Admitting: Physical Therapy

## 2022-11-30 NOTE — Therapy (Signed)
OUTPATIENT PHYSICAL THERAPY NEURO TREATMENT NOTE   Patient Name: Dale Key MRN: [id number] DOB:1956/01/09, 67 y.o., adult Today's Date: 12/01/2022   PCP: Barbette Reichmann, MD REFERRING PROVIDER: Barbette Reichmann, MD  END OF SESSION:  PT End of Session - 12/01/22 1608     Visit Number 8    Number of Visits 13    Date for PT Re-Evaluation 12/04/22    Authorization Type [employer] Medicare    Progress Note Due on Visit 10    PT Start Time 1537   pt late   PT Stop Time 1612    PT Time Calculation (min) 35 min    Equipment Utilized During Treatment Gait belt    Activity Tolerance Patient tolerated treatment well;Patient limited by fatigue    Behavior During Therapy WFL for tasks assessed/performed                   Past Medical History:  Diagnosis Date   Diabetes mellitus without complication (HCC)    Glaucoma    Hyperlipidemia    Hypertension    Tracheostomy present [facility])    Past Surgical History:  Procedure Laterality Date   ANTERIOR CERVICAL DECOMP/DISCECTOMY FUSION N/A 10/07/2012   Procedure: ANTERIOR CERVICAL DECOMPRESSION/DISCECTOMY FUSION 2 LEVELS;  Surgeon: Karn Cassis, MD;  Location: [facility] OR;  Service: Neurosurgery;  Laterality: N/A;  Anterior Cervical Three-Four/Four-Five Decompression and Fusion   EYE SURGERY     FOOT SURGERY Left    Pt states a tendon was removed to replace one in right hand.   HAND SURGERY Right    TRACHEOSTOMY     Patient Active Problem List   Diagnosis Date Noted   Age-related nuclear cataract of both eyes 10/01/2021   Primary open angle glaucoma of left eye, mild stage 10/01/2021   Abnormal weight loss 08/05/2020   Chronic idiopathic constipation 08/05/2020   Colon cancer screening 08/05/2020   Gastroesophageal reflux disease 08/05/2020   Low back pain 08/05/2020   Morbid obesity (HCC) 08/05/2020   History of colonic polyps 08/05/2020   Dyspnea on effort 07/30/2020   Thyroid nodule 11/13/2019   Lymphadenopathy  11/06/2019   Glaucoma of right eye 05/11/2019   Moderate nonproliferative diabetic retinopathy 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 edema associated with type 2 diabetes mellitus (HCC) 05/11/2019   Nuclear sclerotic cataract of both eyes 05/11/2019   OSA on CPAP 05/08/2019   Poor mobility 06/24/2018   Urinary tract infection due to ESBL Klebsiella 06/08/2018   Recurrent UTI 05/10/2018   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/10/2018   BMI 40.0-44.9, adult (HCC) 11/25/2017   Cervical disc disease with myelopathy 11/15/2017   Anemia 10/20/2017   Benign essential hypertension 10/20/2017   Constipation 10/20/2017   Controlled type 2 diabetes mellitus with hyperglycemia, without long-term current use of insulin (HCC) 10/20/2017   Generalized osteoarthritis of multiple sites 10/20/2017   Hoarseness or changing voice 10/20/2017   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   Upper extremity weakness 06/16/2017   Left arm weakness 06/16/2017   S/P cervical spinal fusion 04/29/2017   Bilateral lower extremity edema 01/09/2017   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   Bilateral vocal cord paralysis 12/22/2015   Vocal cord paralysis, unilateral complete 12/22/2015   Oropharyngeal dysphagia 12/02/2015   Low vitamin D level 11/19/2015   Elevated troponin level 11/19/2015   Hypertension 11/14/2015   Type 2 diabetes mellitus, without long-term current use of insulin (HCC) 11/14/2015   Type 2 diabetes mellitus (HCC)  11/14/2015   BPH (benign prostatic hyperplasia) 06/14/2014   Epididymitis, right 05/17/2014   Hematuria 05/17/2014   Swollen testicle 05/17/2014   Testis mass 05/17/2014   Gross hematuria 10/19/2013   Central cord syndrome (HCC) 10/13/2012    ONSET DATE: 09/28/2022 (MD referral)  REFERRING DIAG: Z74.09 (ICD-10-CM) - Poor mobility   THERAPY DIAG:  Muscle weakness (generalized)  Unsteadiness on feet  Other abnormalities of gait and  mobility  Rationale for Evaluation and Treatment: Rehabilitation  SUBJECTIVE:                                                                                                                                                                                             SUBJECTIVE STATEMENT: Same old. Slipped when trying to move some stuff but "didn't hit the ground or anything like that." Was on his feet all day yesterday and "overdid it." Reports L ankle pain as a result.   Pt accompanied by: self  PERTINENT HISTORY: PMH of HTN, DM 2, COPD/asthma, cervical spine fusion, constipation, peripheral edema, OSA, vitamin D deficiency, gout who presents for hospital follow-up. Was seen in the emergency room on 06/24/2022 with shortness of breath, AKI/dehydration.   PAIN:  Are you having pain? Yes: NPRS scale:  /10 Pain location: L medial ankle Pain description: weak Aggravating factors: unsure Relieving factors: cream  PRECAUTIONS: Fall  RED FLAGS: None   WEIGHT BEARING RESTRICTIONS: No  FALLS: Has patient fallen in last 6 months? Yes. Number of falls 1  LIVING ENVIRONMENT: Lives with: lives alone Lives in: House/apartment Stairs: Ramp Has following equipment at home: Single point cane  PLOF: Independent with household mobility with device and Independent with community mobility with device Has gone to [employer] in the past (>1 year); works some as Curator PATIENT GOALS: Get better leg strength  OBJECTIVE:    TODAY'S TREATMENT: 12/01/2022 Activity Comments  Runner's stretch 30" each   Bridge 2x10 Cues to isolate glutes; limited ROM  LTR x20  Cues to isolate upper body for max ROM within comfort. This appeared challenging as patient is rigid through trunk  hooklying march 2x10 Cueing for rhythmic breathing; reduced ROM on R LE  SLR 5x each Good ability to achieve quad contraction; hip flexion slightly limited   sidelying hip ABD/ADD 10x each Unable to lift into adduction;  manual assist to roll hips anteriorly with hip ABD  Nustep L3 x 3.5 min UEs/LEs For ROM and stretching     HOME EXERCISE PROGRAM: Access Code: [email or web address] URL: [email or web address] Date: 11/10/2022 Prepared by: [facility] - Outpatient  Rehab - [facility]  Exercises - Seated March  -  1 x daily - 7 x weekly - 3 sets - 10 reps - Seated Long Arc Quad  - 1 x daily - 7 x weekly - 3 sets - 10 reps - Seated Ankle Dorsiflexion AROM  - 1 x daily - 7 x weekly - 3 sets - 10 reps - Seated Hamstring Stretch  - 1 x daily - 7 x weekly - 1 sets - 3 reps - 30 sec hold - Seated Hip Adduction Isometrics with Ball  - 1 x daily - 7 x weekly - 3 sets - 10 reps - 3 sec hold - Standing Gastroc Stretch at Counter  - 1 x daily - 7 x weekly - 1 sets - 3 reps - 30 sec hold - Mini Squat with Counter Support  - 1 x daily - 7 x weekly - 3 sets - 3 reps - Standing Hip Abduction with Counter Support  - 1 x daily - 7 x weekly - 3 sets - 10 reps - Standing Hip Extension with Counter Support  - 1 x daily - 7 x weekly - 3 sets - 10 reps - Alternating Step Taps with Counter Support  - 1 x daily - 7 x weekly - 3 sets - 10 reps - Side Step Overs with Cones and Unilateral Counter Support  - 1 x daily - 5 x weekly - 2 sets - 10 reps    --------------------------------------------------------------- Note: Objective measures below were completed at Evaluation unless otherwise noted.  DIAGNOSTIC FINDINGS: NA for this episode  COGNITION: Overall cognitive status: Within functional limits for tasks assessed   SENSATION: Light touch: WFL  EDEMA:  BLE edema noted appears similar to lymphedema; reports he has had BLEs wrapped in the past and plans to have this done coming up.  POSTURE: rounded shoulders, forward head, posterior pelvic tilt, and flexed trunk   LOWER EXTREMITY ROM:   full ankle ROM limited due to edema at ankles/feet    LOWER EXTREMITY MMT:    MMT Right Eval Left Eval  Hip flexion 3+  3+  Hip extension    Hip abduction 4 4  Hip adduction 4 4  Hip internal rotation    Hip external rotation    Knee flexion 4+ 4+  Knee extension 4 4  Ankle dorsiflexion 4 4  Ankle plantarflexion    Ankle inversion    Ankle eversion    (Blank rows = not tested)    TRANSFERS: Assistive device utilized: None  Sit to stand: SBA BUE support due to knee pain Stand to sit: SBA BUE support   GAIT: Gait pattern:  LLE externally rotated, step through pattern, poor foot clearance- Right, and poor foot clearance- Left Distance walked: 170 ft Assistive device utilized: Single point cane Level of assistance: SBA Comments: forward flexed posture  FUNCTIONAL TESTS:  5 times sit to stand: 17.63 sec with BUE support Timed up and go (TUG): 21.4 sec 2 minute walk test: 166 ft 10 meter walk test: 23.09 sec ( 1.42 ft/sec)   TODAY'S TREATMENT:  DATE: 10/20/2022    PATIENT EDUCATION: Education details: Eval results, POC, HEP  Person educated: Patient Education method: Explanation and Handouts Education comprehension: verbalized understanding  HOME EXERCISE PROGRAM: Access Code: [email or web address] URL: [email or web address] Date: 10/20/2022 Prepared by: [facility] - Outpatient  Rehab - [facility]  Exercises - Seated March  - 1 x daily - 7 x weekly - 3 sets - 10 reps - Seated Long Arc Quad  - 1 x daily - 7 x weekly - 3 sets - 10 reps - Seated Ankle Dorsiflexion AROM  - 1 x daily - 7 x weekly - 3 sets - 10 reps  GOALS: Goals reviewed with patient? Yes  SHORT TERM GOALS: Target date: 11/20/2022  Pt will be independent with HEP for improved strength, balance, gait. Baseline: Goal status: MET 11/24/22  2.  Pt will improve 5x sit<>stand to less than or equal to 12 sec to demonstrate improved functional strength and transfer efficiency. Baseline: 17.63  sec BUE support>13.75 sec 11/17/2022 Goal status: PARTIALLY MET 11/17/2022  3.  Pt will improve TUG score to less than or equal to 17 sec for decreased fall risk. Baseline: 21.63 sec>20.31 sec 11/17/2022 Goal status: IN PROGRESS  LONG TERM GOALS: Target date: 12/04/2022  Pt will be independent with HEP for improved balance, strength, gait. Baseline:  Goal status: IN PROGRESS  2.  Pt will improve gait velocity to at least 1.8 ft/sec for improved gait efficiency and safety. Baseline: 1.42 ft/sec Goal status: IN PROGRESS  3.  Pt will improve TUG score to less than or equal to 13 sec for decreased fall risk. Baseline: 21.4 sec Goal status: IN PROGRESS  4.  Pt will ambulate 200 ft in 2 minute walk for improved gait endurance and efficiency. Baseline:  Goal status: IN PROGRESS  5.  Pt will verbalize plans for continued community fitness upon d/c from PT. Baseline:  Goal status: IN PROGRESS  ASSESSMENT:  CLINICAL IMPRESSION: Patient arrived to session with report of being on his feet all day yesterday and reports L medial ankle discomfort as a result. Worked on stretching to address this, then initiated max ther-ex for trunk ROM and hip strengthening. Patient with significant weakness in B hip adductors and unable to lift against gravity, thus educated on importance of continuing seated ball squeeze to address this muscle group. Patient reported fatigue at end of session, thus ended session early. Patient declined offer to be walked to his car to ensure safety.   OBJECTIVE IMPAIRMENTS: Abnormal gait, decreased balance, decreased mobility, difficulty walking, decreased strength, impaired flexibility, and postural dysfunction.   ACTIVITY LIMITATIONS: standing, transfers, and locomotion level  PARTICIPATION LIMITATIONS: shopping, community activity, and occupation  PERSONAL FACTORS: 3+ comorbidities: see PMH  are also affecting patient's functional outcome.   REHAB POTENTIAL:  Good  CLINICAL DECISION MAKING: Evolving/moderate complexity  EVALUATION COMPLEXITY: Moderate  PLAN:  PT FREQUENCY: 1-2x/week  PT DURATION: 6 weeks  PLANNED INTERVENTIONS: 97110-Therapeutic exercises, 97530- Therapeutic activity, 97112- Neuromuscular re-education, 97535- Self Care, [address]- Manual therapy, [phone]- Gait training, [phone]- Orthotic Fit/training, Patient/Family education, Balance training, Therapeutic exercises, Therapeutic activity, Neuromuscular re-education, Gait training, and Self Care  PLAN FOR NEXT SESSION:  Continue to progress for BLE strengthening (hip/knee flexion), standing balance, gait activities.  Pt to bring in picture of his stepper at home and would benefit from education on more consistent use.    Anette Guarneri, PT, DPT 12/01/22 4:14 PM  [facility] [address] [place], [address]  [place], [place] [address] Phone # [phone] Fax # [phone]

## 2022-12-01 ENCOUNTER — Ambulatory Visit: Payer: 59 | Admitting: Physical Therapy

## 2022-12-01 ENCOUNTER — Encounter: Payer: Self-pay | Admitting: Physical Therapy

## 2022-12-01 DIAGNOSIS — M6281 Muscle weakness (generalized): Secondary | ICD-10-CM

## 2022-12-01 DIAGNOSIS — R2681 Unsteadiness on feet: Secondary | ICD-10-CM

## 2022-12-01 DIAGNOSIS — R2689 Other abnormalities of gait and mobility: Secondary | ICD-10-CM

## 2022-12-03 ENCOUNTER — Ambulatory Visit: Payer: 59 | Admitting: Physical Therapy

## 2022-12-03 ENCOUNTER — Encounter: Payer: Self-pay | Admitting: Physical Therapy

## 2022-12-03 DIAGNOSIS — R2689 Other abnormalities of gait and mobility: Secondary | ICD-10-CM

## 2022-12-03 DIAGNOSIS — M6281 Muscle weakness (generalized): Secondary | ICD-10-CM | POA: Diagnosis not present

## 2022-12-03 DIAGNOSIS — R2681 Unsteadiness on feet: Secondary | ICD-10-CM

## 2022-12-03 NOTE — Therapy (Signed)
OUTPATIENT PHYSICAL THERAPY NEURO TREATMENT NOTE/DISCHARGE SUMMARY   Patient Name: Dale Key MRN: [id number] DOB:10/25/1955, 67 y.o., adult Today's Date: 12/03/2022   PCP: Barbette Reichmann, MD REFERRING PROVIDER: Barbette Reichmann, MD  PHYSICAL THERAPY DISCHARGE SUMMARY  Visits from Start of Care: 9  Current functional level related to goals / functional outcomes: Pt has met 4 of 5 LTGs   Remaining deficits: BLE weakness, balance-improving   Education / Equipment: HEP, community fitness   Patient agrees to discharge. Patient goals were partially met. Patient is being discharged due to being pleased with the current functional level.  END OF SESSION:  PT End of Session - 12/03/22 1456     Visit Number 9    Number of Visits 13    Date for PT Re-Evaluation 12/04/22    Authorization Type [employer] Medicare    Progress Note Due on Visit 10    PT Start Time 1500   Late arrival and in restroom   PT Stop Time 1526    PT Time Calculation (min) 26 min    Equipment Utilized During Treatment Gait belt    Activity Tolerance Patient tolerated treatment well    Behavior During Therapy WFL for tasks assessed/performed                   Past Medical History:  Diagnosis Date   Diabetes mellitus without complication (HCC)    Glaucoma    Hyperlipidemia    Hypertension    Tracheostomy present [facility])    Past Surgical History:  Procedure Laterality Date   ANTERIOR CERVICAL DECOMP/DISCECTOMY FUSION N/A 10/07/2012   Procedure: ANTERIOR CERVICAL DECOMPRESSION/DISCECTOMY FUSION 2 LEVELS;  Surgeon: Karn Cassis, MD;  Location: [facility] OR;  Service: Neurosurgery;  Laterality: N/A;  Anterior Cervical Three-Four/Four-Five Decompression and Fusion   EYE SURGERY     FOOT SURGERY Left    Pt states a tendon was removed to replace one in right hand.   HAND SURGERY Right    TRACHEOSTOMY     Patient Active Problem List   Diagnosis Date Noted   Age-related nuclear cataract of both eyes  10/01/2021   Primary open angle glaucoma of left eye, mild stage 10/01/2021   Abnormal weight loss 08/05/2020   Chronic idiopathic constipation 08/05/2020   Colon cancer screening 08/05/2020   Gastroesophageal reflux disease 08/05/2020   Low back pain 08/05/2020   Morbid obesity (HCC) 08/05/2020   History of colonic polyps 08/05/2020   Dyspnea on effort 07/30/2020   Thyroid nodule 11/13/2019   Lymphadenopathy 11/06/2019   Glaucoma of right eye 05/11/2019   Moderate nonproliferative diabetic retinopathy 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 edema associated with type 2 diabetes mellitus (HCC) 05/11/2019   Nuclear sclerotic cataract of both eyes 05/11/2019   OSA on CPAP 05/08/2019   Poor mobility 06/24/2018   Urinary tract infection due to ESBL Klebsiella 06/08/2018   Recurrent UTI 05/10/2018   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/10/2018   BMI 40.0-44.9, adult (HCC) 11/25/2017   Cervical disc disease with myelopathy 11/15/2017   Anemia 10/20/2017   Benign essential hypertension 10/20/2017   Constipation 10/20/2017   Controlled type 2 diabetes mellitus with hyperglycemia, without long-term current use of insulin (HCC) 10/20/2017   Generalized osteoarthritis of multiple sites 10/20/2017   Hoarseness or changing voice 10/20/2017   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   Upper extremity weakness 06/16/2017   Left arm weakness 06/16/2017   S/P cervical spinal fusion 04/29/2017   Bilateral lower extremity edema 01/09/2017  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   Bilateral vocal cord paralysis 12/22/2015   Vocal cord paralysis, unilateral complete 12/22/2015   Oropharyngeal dysphagia 12/02/2015   Low vitamin D level 11/19/2015   Elevated troponin level 11/19/2015   Hypertension 11/14/2015   Type 2 diabetes mellitus, without long-term current use of insulin (HCC) 11/14/2015   Type 2 diabetes mellitus (HCC) 11/14/2015   BPH (benign prostatic  hyperplasia) 06/14/2014   Epididymitis, right 05/17/2014   Hematuria 05/17/2014   Swollen testicle 05/17/2014   Testis mass 05/17/2014   Gross hematuria 10/19/2013   Central cord syndrome (HCC) 10/13/2012    ONSET DATE: 09/28/2022 (MD referral)  REFERRING DIAG: Z74.09 (ICD-10-CM) - Poor mobility   THERAPY DIAG:  Muscle weakness (generalized)  Unsteadiness on feet  Other abnormalities of gait and mobility  Rationale for Evaluation and Treatment: Rehabilitation  SUBJECTIVE:                                                                                                                                                                                             SUBJECTIVE STATEMENT: I've learned that my hips are weak.  I've been pleased that I can go up the steps one leg at a time now.  Plan to go to [employer]  Pt accompanied by: self  PERTINENT HISTORY: PMH of HTN, DM 2, COPD/asthma, cervical spine fusion, constipation, peripheral edema, OSA, vitamin D deficiency, gout who presents for hospital follow-up. Was seen in the emergency room on 06/24/2022 with shortness of breath, AKI/dehydration.   PAIN:  Are you having pain? Yes: NPRS scale: no/10 Pain location: L medial ankle Pain description: weak Aggravating factors: unsure Relieving factors: cream  PRECAUTIONS: Fall  RED FLAGS: None   WEIGHT BEARING RESTRICTIONS: No  FALLS: Has patient fallen in last 6 months? Yes. Number of falls 1  LIVING ENVIRONMENT: Lives with: lives alone Lives in: House/apartment Stairs: Ramp Has following equipment at home: Single point cane  PLOF: Independent with household mobility with device and Independent with community mobility with device Has gone to [employer] in the past (>1 year); works some as Curator PATIENT GOALS: Get better leg strength  OBJECTIVE:    TODAY'S TREATMENT: 12/03/2022 Activity Comments  TUG:  16.03 sec Improved from 20 sec  Gait velocity 15.91 sec  (2.06 ft/sec) Improved from 1.4 ft/sec  212 ft Improved from 166 ft  Verbally reveiwed HEP   LAQ x 10 Seated march x 10 Green theraband       PATIENT EDUCATION: Education details: Progress towards goals, POC and plans for d/c this visit, progression of  seated ex with red and green theraband; discussed optimal frequency for [employer] to use machines for continued strengthening Person educated: Patient Education method: Medical illustrator Education comprehension: verbalized understanding and returned demonstration    HOME EXERCISE PROGRAM: Access Code: [email or web address] URL: [email or web address] Date: 11/10/2022 Prepared by: [facility] - Outpatient  Rehab - [facility]  Exercises - Seated March  - 1 x daily - 7 x weekly - 3 sets - 10 reps - Seated Long Arc Quad  - 1 x daily - 7 x weekly - 3 sets - 10 reps - Seated Ankle Dorsiflexion AROM  - 1 x daily - 7 x weekly - 3 sets - 10 reps - Seated Hamstring Stretch  - 1 x daily - 7 x weekly - 1 sets - 3 reps - 30 sec hold - Seated Hip Adduction Isometrics with Ball  - 1 x daily - 7 x weekly - 3 sets - 10 reps - 3 sec hold - Standing Gastroc Stretch at Counter  - 1 x daily - 7 x weekly - 1 sets - 3 reps - 30 sec hold - Mini Squat with Counter Support  - 1 x daily - 7 x weekly - 3 sets - 3 reps - Standing Hip Abduction with Counter Support  - 1 x daily - 7 x weekly - 3 sets - 10 reps - Standing Hip Extension with Counter Support  - 1 x daily - 7 x weekly - 3 sets - 10 reps - Alternating Step Taps with Counter Support  - 1 x daily - 7 x weekly - 3 sets - 10 reps - Side Step Overs with Cones and Unilateral Counter Support  - 1 x daily - 5 x weekly - 2 sets - 10 reps    --------------------------------------------------------------- Note: Objective measures below were completed at Evaluation unless otherwise noted.  DIAGNOSTIC FINDINGS: NA for this episode  COGNITION: Overall cognitive status: Within  functional limits for tasks assessed   SENSATION: Light touch: WFL  EDEMA:  BLE edema noted appears similar to lymphedema; reports he has had BLEs wrapped in the past and plans to have this done coming up.  POSTURE: rounded shoulders, forward head, posterior pelvic tilt, and flexed trunk   LOWER EXTREMITY ROM:   full ankle ROM limited due to edema at ankles/feet    LOWER EXTREMITY MMT:    MMT Right Eval Left Eval  Hip flexion 3+ 3+  Hip extension    Hip abduction 4 4  Hip adduction 4 4  Hip internal rotation    Hip external rotation    Knee flexion 4+ 4+  Knee extension 4 4  Ankle dorsiflexion 4 4  Ankle plantarflexion    Ankle inversion    Ankle eversion    (Blank rows = not tested)    TRANSFERS: Assistive device utilized: None  Sit to stand: SBA BUE support due to knee pain Stand to sit: SBA BUE support   GAIT: Gait pattern:  LLE externally rotated, step through pattern, poor foot clearance- Right, and poor foot clearance- Left Distance walked: 170 ft Assistive device utilized: Single point cane Level of assistance: SBA Comments: forward flexed posture  FUNCTIONAL TESTS:  5 times sit to stand: 17.63 sec with BUE support Timed up and go (TUG): 21.4 sec 2 minute walk test: 166 ft 10 meter walk test: 23.09 sec ( 1.42 ft/sec)   TODAY'S TREATMENT:  DATE: 10/20/2022    PATIENT EDUCATION: Education details: Eval results, POC, HEP  Person educated: Patient Education method: Explanation and Handouts Education comprehension: verbalized understanding  HOME EXERCISE PROGRAM: Access Code: [email or web address] URL: [email or web address] Date: 10/20/2022 Prepared by: [facility] - Outpatient  Rehab - [facility]  Exercises - Seated March  - 1 x daily - 7 x weekly - 3 sets - 10 reps - Seated Long Arc Quad  - 1 x daily - 7 x weekly -  3 sets - 10 reps - Seated Ankle Dorsiflexion AROM  - 1 x daily - 7 x weekly - 3 sets - 10 reps  GOALS: Goals reviewed with patient? Yes  SHORT TERM GOALS: Target date: 11/20/2022  Pt will be independent with HEP for improved strength, balance, gait. Baseline: Goal status: MET 11/24/22  2.  Pt will improve 5x sit<>stand to less than or equal to 12 sec to demonstrate improved functional strength and transfer efficiency. Baseline: 17.63 sec BUE support>13.75 sec 11/17/2022 Goal status: PARTIALLY MET 11/17/2022  3.  Pt will improve TUG score to less than or equal to 17 sec for decreased fall risk. Baseline: 21.63 sec>20.31 sec 11/17/2022 Goal status: IN PROGRESS  LONG TERM GOALS: Target date: 12/04/2022  Pt will be independent with HEP for improved balance, strength, gait. Baseline:  Goal status: MET, 12/03/2022  2.  Pt will improve gait velocity to at least 1.8 ft/sec for improved gait efficiency and safety. Baseline: 1.42 ft/sec>2.06 ft/sec Goal status: MET 12/03/2022  3.  Pt will improve TUG score to less than or equal to 13 sec for decreased fall risk. Baseline: 21.4 sec>16.03 sec 12/03/2022 Goal status: PARTIALLY MET, 12/03/2022  4.  Pt will ambulate 200 ft in 2 minute walk for improved gait endurance and efficiency. Baseline: 212 ft in 2 MWT Goal status: MET11/21/2024  5.  Pt will verbalize plans for continued community fitness upon d/c from PT. Baseline:  Goal status: MET, 12/03/2022  ASSESSMENT:  CLINICAL IMPRESSION: Pt has met 4 of 5 LTGs.  He has improved gait velocity and gait distance in 2 MWT since eval.  He has improved TUG score, but not to goal level.  He reports understanding and consistent performance of HEP as well as plans to return to [employer] for continued community fitness.  He is agreeable to and appropriate for discharge at this time.  OBJECTIVE IMPAIRMENTS: Abnormal gait, decreased balance, decreased mobility, difficulty walking, decreased  strength, impaired flexibility, and postural dysfunction.   ACTIVITY LIMITATIONS: standing, transfers, and locomotion level  PARTICIPATION LIMITATIONS: shopping, community activity, and occupation  PERSONAL FACTORS: 3+ comorbidities: see PMH  are also affecting patient's functional outcome.   REHAB POTENTIAL: Good  CLINICAL DECISION MAKING: Evolving/moderate complexity  EVALUATION COMPLEXITY: Moderate  PLAN:  PT FREQUENCY: 1-2x/week  PT DURATION: 6 weeks  PLANNED INTERVENTIONS: 97110-Therapeutic exercises, 97530- Therapeutic activity, 97112- Neuromuscular re-education, 97535- Self Care, 16109- Manual therapy, [phone]- Gait training, [phone]- Orthotic Fit/training, Patient/Family education, Balance training, Therapeutic exercises, Therapeutic activity, Neuromuscular re-education, Gait training, and Self Care  PLAN FOR NEXT SESSION:  Discharge.   Lonia Blood, PT 12/03/22 3:30 PM Phone: [phone] Fax: [phone]  [facility] at [facility] [address] [place], [address] [place], [place] [address] Phone # [phone] Fax # [phone]

## 2023-01-07 ENCOUNTER — Encounter: Payer: Self-pay | Admitting: Podiatry

## 2023-01-07 ENCOUNTER — Ambulatory Visit (INDEPENDENT_AMBULATORY_CARE_PROVIDER_SITE_OTHER): Payer: 59 | Admitting: Podiatry

## 2023-01-07 DIAGNOSIS — B351 Tinea unguium: Secondary | ICD-10-CM | POA: Diagnosis not present

## 2023-01-07 DIAGNOSIS — M79675 Pain in left toe(s): Secondary | ICD-10-CM

## 2023-01-07 DIAGNOSIS — L84 Corns and callosities: Secondary | ICD-10-CM | POA: Diagnosis not present

## 2023-01-07 DIAGNOSIS — E1151 Type 2 diabetes mellitus with diabetic peripheral angiopathy without gangrene: Secondary | ICD-10-CM

## 2023-01-07 DIAGNOSIS — M79674 Pain in right toe(s): Secondary | ICD-10-CM | POA: Diagnosis not present

## 2023-01-07 NOTE — Progress Notes (Signed)
  Subjective:  Patient ID: Dale Key, adult    DOB: 1955-06-17,  MRN: [id number]  Dale Key presents to clinic today for at risk foot care. Pt has h/o NIDDM with PAD. He does present with left 3rd toe callus. Chief Complaint  Patient presents with   dfc    He reports he is her for [facility] Last A1c was 6.2, He reports that he has had increased swelling on on both ankles.    New problem(s): None.   Patient states he is unable to reach his feet.  PCP is Barbette Reichmann, MD.  Allergies  Allergen Reactions   Sulfamethoxazole-Trimethoprim Other (See Comments) and Diarrhea   Other Nausea Only    Review of Systems: Negative except as noted in the HPI.  Objective: No changes noted in today's physical examination. There were no vitals filed for this visit.  Dale Key is a pleasant 67 y.o. adult morbidly obese in NAD. AAO x 3.  Vascular Examination: CFT <3 seconds b/l LE. Diminished DP pulse(s) b/l LE. Diminished PT pulse(s) b/l LE. Pedal hair absent. No pain with calf compression b/l. Lower extremity skin temperature gradient within normal limits. +3 pitting edema BLE. Evidence of chronic venous insufficiency b/l LE. No ischemia or gangrene noted b/l LE. No cyanosis or clubbing noted b/l LE.  Dermatological Examination: No open wounds b/l LE. No interdigital macerations noted, dry flaky skin present b/l LE. Toenails 1-5 b/l elongated, discolored, dystrophic, thickened, crumbly with subungual debris and tenderness to dorsal palpation.   Preulcerative lesion noted L 3rd toe. There is visible subdermal hemorrhage. There is no surrounding erythema, no edema, no drainage, no odor, no fluctuance.   Hyperpigmentation consistent with findings of chronic venous insufficiency is present b/l lower extremities. Pedal skin noted to be dry b/l lower extremities.  Neurological Examination: Protective sensation intact 5/5 intact bilaterally with 10g monofilament b/l. Vibratory sensation intact  b/l.  Musculoskeletal Examination: Normal muscle strength 5/5 to all lower extremity muscle groups bilaterally. Hammertoe deformity noted 2-5 b/l. No pain, crepitus or joint limitation noted with ROM b/l LE.  Patient ambulates independently without assistive aids.   Assessment/Plan: 1. Type II diabetes mellitus with peripheral circulatory disorder (HCC)   2. Pre-ulcerative corn or callous   3. Pain due to onychomycosis of toenails of both feet     No orders of the defined types were placed in this encounter.  -Patient was evaluated and treated. All patient's and/or POA's questions/concerns answered on today's visit. -Continue foot and shoe inspections daily. Monitor blood glucose per PCP/Endocrinologist's recommendations. -Continue supportive shoe gear daily. -Toenails 1-5 b/l were debrided in length and girth with sterile nail nippers and dremel without iatrogenic bleeding.  -Preulcerative lesion pared left third digit utilizing sterile scalpel blade. Total number pared=1. - Advised patient to discuss worsening lower extremity edema with cardiologist. Could proceed with compression stockings or lymphedema pumps if cleared by cardiologist. -Patient/POA to call should there be question/concern in the interim.   Return in about 3 months (around 04/07/2023) for Diabetic Foot Care.  Barbaraann Share, DPM

## 2023-01-09 ENCOUNTER — Encounter: Payer: Self-pay | Admitting: Podiatry

## 2023-03-02 ENCOUNTER — Encounter (HOSPITAL_BASED_OUTPATIENT_CLINIC_OR_DEPARTMENT_OTHER): Payer: 59 | Attending: Internal Medicine | Admitting: Internal Medicine

## 2023-03-02 DIAGNOSIS — I87311 Chronic venous hypertension (idiopathic) with ulcer of right lower extremity: Secondary | ICD-10-CM | POA: Insufficient documentation

## 2023-03-02 DIAGNOSIS — I87312 Chronic venous hypertension (idiopathic) with ulcer of left lower extremity: Secondary | ICD-10-CM | POA: Insufficient documentation

## 2023-03-02 DIAGNOSIS — L97812 Non-pressure chronic ulcer of other part of right lower leg with fat layer exposed: Secondary | ICD-10-CM | POA: Diagnosis not present

## 2023-03-02 DIAGNOSIS — L97822 Non-pressure chronic ulcer of other part of left lower leg with fat layer exposed: Secondary | ICD-10-CM | POA: Insufficient documentation

## 2023-03-02 DIAGNOSIS — I890 Lymphedema, not elsewhere classified: Secondary | ICD-10-CM | POA: Insufficient documentation

## 2023-03-09 ENCOUNTER — Ambulatory Visit (HOSPITAL_BASED_OUTPATIENT_CLINIC_OR_DEPARTMENT_OTHER): Payer: 59 | Admitting: General Surgery

## 2023-03-12 ENCOUNTER — Encounter (HOSPITAL_BASED_OUTPATIENT_CLINIC_OR_DEPARTMENT_OTHER): Payer: 59 | Admitting: Internal Medicine

## 2023-03-12 DIAGNOSIS — I87311 Chronic venous hypertension (idiopathic) with ulcer of right lower extremity: Secondary | ICD-10-CM | POA: Diagnosis not present

## 2023-03-12 DIAGNOSIS — L97822 Non-pressure chronic ulcer of other part of left lower leg with fat layer exposed: Secondary | ICD-10-CM | POA: Diagnosis not present

## 2023-03-12 DIAGNOSIS — I87312 Chronic venous hypertension (idiopathic) with ulcer of left lower extremity: Secondary | ICD-10-CM | POA: Diagnosis not present

## 2023-03-12 DIAGNOSIS — L97812 Non-pressure chronic ulcer of other part of right lower leg with fat layer exposed: Secondary | ICD-10-CM | POA: Diagnosis not present

## 2023-03-12 DIAGNOSIS — I890 Lymphedema, not elsewhere classified: Secondary | ICD-10-CM | POA: Diagnosis not present

## 2023-03-18 ENCOUNTER — Encounter (HOSPITAL_BASED_OUTPATIENT_CLINIC_OR_DEPARTMENT_OTHER): Payer: 59 | Attending: Internal Medicine | Admitting: Internal Medicine

## 2023-03-18 DIAGNOSIS — I87312 Chronic venous hypertension (idiopathic) with ulcer of left lower extremity: Secondary | ICD-10-CM | POA: Diagnosis not present

## 2023-03-18 DIAGNOSIS — L97812 Non-pressure chronic ulcer of other part of right lower leg with fat layer exposed: Secondary | ICD-10-CM | POA: Diagnosis not present

## 2023-03-18 DIAGNOSIS — I890 Lymphedema, not elsewhere classified: Secondary | ICD-10-CM | POA: Diagnosis present

## 2023-03-18 DIAGNOSIS — I87311 Chronic venous hypertension (idiopathic) with ulcer of right lower extremity: Secondary | ICD-10-CM | POA: Insufficient documentation

## 2023-03-18 DIAGNOSIS — L97822 Non-pressure chronic ulcer of other part of left lower leg with fat layer exposed: Secondary | ICD-10-CM | POA: Diagnosis not present

## 2023-03-26 ENCOUNTER — Encounter (HOSPITAL_BASED_OUTPATIENT_CLINIC_OR_DEPARTMENT_OTHER): Payer: 59 | Admitting: Internal Medicine

## 2023-03-26 DIAGNOSIS — I87311 Chronic venous hypertension (idiopathic) with ulcer of right lower extremity: Secondary | ICD-10-CM

## 2023-03-26 DIAGNOSIS — I87312 Chronic venous hypertension (idiopathic) with ulcer of left lower extremity: Secondary | ICD-10-CM | POA: Diagnosis not present

## 2023-03-26 DIAGNOSIS — L97812 Non-pressure chronic ulcer of other part of right lower leg with fat layer exposed: Secondary | ICD-10-CM | POA: Diagnosis not present

## 2023-03-26 DIAGNOSIS — L97822 Non-pressure chronic ulcer of other part of left lower leg with fat layer exposed: Secondary | ICD-10-CM | POA: Diagnosis not present

## 2023-04-02 ENCOUNTER — Ambulatory Visit (HOSPITAL_BASED_OUTPATIENT_CLINIC_OR_DEPARTMENT_OTHER): Admitting: Internal Medicine

## 2023-04-06 ENCOUNTER — Encounter (HOSPITAL_BASED_OUTPATIENT_CLINIC_OR_DEPARTMENT_OTHER): Admitting: Internal Medicine

## 2023-04-06 DIAGNOSIS — I87312 Chronic venous hypertension (idiopathic) with ulcer of left lower extremity: Secondary | ICD-10-CM | POA: Diagnosis not present

## 2023-04-06 DIAGNOSIS — L97812 Non-pressure chronic ulcer of other part of right lower leg with fat layer exposed: Secondary | ICD-10-CM | POA: Diagnosis not present

## 2023-04-06 DIAGNOSIS — I890 Lymphedema, not elsewhere classified: Secondary | ICD-10-CM | POA: Diagnosis not present

## 2023-04-06 DIAGNOSIS — L97822 Non-pressure chronic ulcer of other part of left lower leg with fat layer exposed: Secondary | ICD-10-CM

## 2023-04-06 DIAGNOSIS — I87311 Chronic venous hypertension (idiopathic) with ulcer of right lower extremity: Secondary | ICD-10-CM | POA: Diagnosis not present

## 2023-04-08 ENCOUNTER — Ambulatory Visit: Payer: 59 | Admitting: Podiatry

## 2023-04-09 ENCOUNTER — Encounter (HOSPITAL_BASED_OUTPATIENT_CLINIC_OR_DEPARTMENT_OTHER): Admitting: Internal Medicine

## 2023-04-13 ENCOUNTER — Encounter (HOSPITAL_BASED_OUTPATIENT_CLINIC_OR_DEPARTMENT_OTHER): Attending: Internal Medicine | Admitting: Internal Medicine

## 2023-04-13 DIAGNOSIS — I87311 Chronic venous hypertension (idiopathic) with ulcer of right lower extremity: Secondary | ICD-10-CM | POA: Insufficient documentation

## 2023-04-13 DIAGNOSIS — L97822 Non-pressure chronic ulcer of other part of left lower leg with fat layer exposed: Secondary | ICD-10-CM | POA: Diagnosis not present

## 2023-04-13 DIAGNOSIS — I87312 Chronic venous hypertension (idiopathic) with ulcer of left lower extremity: Secondary | ICD-10-CM | POA: Insufficient documentation

## 2023-04-13 DIAGNOSIS — I890 Lymphedema, not elsewhere classified: Secondary | ICD-10-CM | POA: Diagnosis present

## 2023-04-13 DIAGNOSIS — L97812 Non-pressure chronic ulcer of other part of right lower leg with fat layer exposed: Secondary | ICD-10-CM | POA: Diagnosis not present

## 2023-04-15 ENCOUNTER — Ambulatory Visit: Admitting: Podiatry

## 2023-04-20 ENCOUNTER — Ambulatory Visit (HOSPITAL_BASED_OUTPATIENT_CLINIC_OR_DEPARTMENT_OTHER): Admitting: Internal Medicine

## 2023-04-21 ENCOUNTER — Encounter (HOSPITAL_BASED_OUTPATIENT_CLINIC_OR_DEPARTMENT_OTHER): Admitting: Internal Medicine

## 2023-04-21 DIAGNOSIS — L97812 Non-pressure chronic ulcer of other part of right lower leg with fat layer exposed: Secondary | ICD-10-CM | POA: Diagnosis not present

## 2023-04-21 DIAGNOSIS — L97822 Non-pressure chronic ulcer of other part of left lower leg with fat layer exposed: Secondary | ICD-10-CM

## 2023-04-21 DIAGNOSIS — I890 Lymphedema, not elsewhere classified: Secondary | ICD-10-CM | POA: Diagnosis not present

## 2023-04-21 DIAGNOSIS — I87312 Chronic venous hypertension (idiopathic) with ulcer of left lower extremity: Secondary | ICD-10-CM

## 2023-04-21 DIAGNOSIS — I87311 Chronic venous hypertension (idiopathic) with ulcer of right lower extremity: Secondary | ICD-10-CM | POA: Diagnosis not present

## 2023-04-27 ENCOUNTER — Encounter (HOSPITAL_BASED_OUTPATIENT_CLINIC_OR_DEPARTMENT_OTHER): Admitting: Internal Medicine

## 2023-04-27 DIAGNOSIS — I890 Lymphedema, not elsewhere classified: Secondary | ICD-10-CM | POA: Diagnosis not present

## 2023-05-10 ENCOUNTER — Encounter (HOSPITAL_BASED_OUTPATIENT_CLINIC_OR_DEPARTMENT_OTHER): Admitting: Internal Medicine

## 2023-05-13 ENCOUNTER — Encounter: Payer: Self-pay | Admitting: Podiatry

## 2023-05-13 ENCOUNTER — Ambulatory Visit (INDEPENDENT_AMBULATORY_CARE_PROVIDER_SITE_OTHER): Admitting: Podiatry

## 2023-05-13 DIAGNOSIS — L84 Corns and callosities: Secondary | ICD-10-CM

## 2023-05-13 DIAGNOSIS — M79675 Pain in left toe(s): Secondary | ICD-10-CM

## 2023-05-13 DIAGNOSIS — E1151 Type 2 diabetes mellitus with diabetic peripheral angiopathy without gangrene: Secondary | ICD-10-CM

## 2023-05-13 DIAGNOSIS — B351 Tinea unguium: Secondary | ICD-10-CM

## 2023-05-13 DIAGNOSIS — M79674 Pain in right toe(s): Secondary | ICD-10-CM

## 2023-05-13 NOTE — Progress Notes (Unsigned)
  Subjective:  Patient ID: Dale Key, adult    DOB: [birth year],  MRN: [id number]  Dale Key presents to clinic today for at risk foot care. Pt has h/o NIDDM with PAD. He does present with left 3rd toe callus. Chief Complaint  Patient presents with   dfc    Rm 17 diabetic foot/blood sugar 169/A1C6.5/no blood thinner   New problem(s): None.   Patient states he is unable to reach his feet.  PCP is Vevelyn Gowers, NP.  Allergies  Allergen Reactions   Sulfamethoxazole-Trimethoprim Other (See Comments) and Diarrhea   Other Nausea Only    Review of Systems: Negative except as noted in the HPI.  Objective: No changes noted in today's physical examination. There were no vitals filed for this visit.  Dale Key is a pleasant 68 y.o. adult morbidly obese in NAD. AAO x 3.  Vascular Examination: CFT <3 seconds b/l LE. Diminished DP pulse(s) b/l LE. Diminished PT pulse(s) b/l LE. Pedal hair absent. No pain with calf compression b/l. Lower extremity skin temperature gradient within normal limits. +3 pitting edema BLE. Evidence of chronic venous insufficiency b/l LE. No ischemia or gangrene noted b/l LE. No cyanosis or clubbing noted b/l LE.  Dermatological Examination: No open wounds b/l LE. No interdigital macerations noted, dry flaky skin present b/l LE. Toenails 1-5 b/l elongated, discolored, dystrophic, thickened, crumbly with subungual debris and tenderness to dorsal palpation.   Preulcerative lesion noted L 3rd toe. There is visible subdermal hemorrhage. There is no surrounding erythema, no edema, no drainage, no odor, no fluctuance.   Patient does have multilayer compression wraps applied to bilateral lower extremities.  Neurological Examination: Protective sensation intact 5/5 intact bilaterally with 10g monofilament b/l. Vibratory sensation intact b/l.  Musculoskeletal Examination: Normal muscle strength 5/5 to all lower extremity muscle groups bilaterally. Hammertoe  deformity noted 2-5 b/l. No pain, crepitus or joint limitation noted with ROM b/l LE.  Patient ambulates independently without assistive aids.   Assessment/Plan: 1. Type II diabetes mellitus with peripheral circulatory disorder (HCC)   2. Pain due to onychomycosis of toenails of both feet   3. Corns and callosities     No orders of the defined types were placed in this encounter.  -Patient was evaluated and treated. All patient's and/or POA's questions/concerns answered on today's visit. -Continue foot and shoe inspections daily. Monitor blood glucose per PCP/Endocrinologist's recommendations. -Continue supportive shoe gear daily. -Toenails 1-5 b/l were debrided in length and girth with sterile nail nippers and dremel without iatrogenic bleeding.  -Preulcerative lesion pared left third digit utilizing sterile scalpel blade. Total number pared=1. -Patient/POA to call should there be question/concern in the interim.   Return in about 3 months (around 08/13/2023) for Diabetic Foot Care.  Reina Cara, DPM

## 2023-05-17 ENCOUNTER — Encounter (HOSPITAL_BASED_OUTPATIENT_CLINIC_OR_DEPARTMENT_OTHER): Attending: Internal Medicine | Admitting: Internal Medicine

## 2023-05-17 DIAGNOSIS — L97812 Non-pressure chronic ulcer of other part of right lower leg with fat layer exposed: Secondary | ICD-10-CM | POA: Insufficient documentation

## 2023-05-17 DIAGNOSIS — I87312 Chronic venous hypertension (idiopathic) with ulcer of left lower extremity: Secondary | ICD-10-CM

## 2023-05-17 DIAGNOSIS — L97822 Non-pressure chronic ulcer of other part of left lower leg with fat layer exposed: Secondary | ICD-10-CM | POA: Diagnosis not present

## 2023-05-17 DIAGNOSIS — I87313 Chronic venous hypertension (idiopathic) with ulcer of bilateral lower extremity: Secondary | ICD-10-CM | POA: Insufficient documentation

## 2023-05-17 DIAGNOSIS — I890 Lymphedema, not elsewhere classified: Secondary | ICD-10-CM | POA: Diagnosis present

## 2023-05-17 DIAGNOSIS — I87311 Chronic venous hypertension (idiopathic) with ulcer of right lower extremity: Secondary | ICD-10-CM | POA: Diagnosis not present

## 2023-05-24 ENCOUNTER — Encounter (HOSPITAL_BASED_OUTPATIENT_CLINIC_OR_DEPARTMENT_OTHER): Admitting: Internal Medicine

## 2023-05-24 DIAGNOSIS — I87312 Chronic venous hypertension (idiopathic) with ulcer of left lower extremity: Secondary | ICD-10-CM

## 2023-05-24 DIAGNOSIS — I87311 Chronic venous hypertension (idiopathic) with ulcer of right lower extremity: Secondary | ICD-10-CM

## 2023-05-24 DIAGNOSIS — I890 Lymphedema, not elsewhere classified: Secondary | ICD-10-CM | POA: Diagnosis not present

## 2023-05-24 DIAGNOSIS — L97822 Non-pressure chronic ulcer of other part of left lower leg with fat layer exposed: Secondary | ICD-10-CM

## 2023-05-24 DIAGNOSIS — L97812 Non-pressure chronic ulcer of other part of right lower leg with fat layer exposed: Secondary | ICD-10-CM

## 2023-05-31 ENCOUNTER — Ambulatory Visit (HOSPITAL_BASED_OUTPATIENT_CLINIC_OR_DEPARTMENT_OTHER): Admitting: Internal Medicine

## 2023-06-01 ENCOUNTER — Encounter (HOSPITAL_BASED_OUTPATIENT_CLINIC_OR_DEPARTMENT_OTHER): Admitting: Internal Medicine

## 2023-06-04 ENCOUNTER — Encounter (HOSPITAL_BASED_OUTPATIENT_CLINIC_OR_DEPARTMENT_OTHER): Admitting: Internal Medicine

## 2023-06-04 DIAGNOSIS — I87312 Chronic venous hypertension (idiopathic) with ulcer of left lower extremity: Secondary | ICD-10-CM | POA: Diagnosis not present

## 2023-06-04 DIAGNOSIS — L97812 Non-pressure chronic ulcer of other part of right lower leg with fat layer exposed: Secondary | ICD-10-CM

## 2023-06-04 DIAGNOSIS — I87311 Chronic venous hypertension (idiopathic) with ulcer of right lower extremity: Secondary | ICD-10-CM | POA: Diagnosis not present

## 2023-06-04 DIAGNOSIS — L97822 Non-pressure chronic ulcer of other part of left lower leg with fat layer exposed: Secondary | ICD-10-CM

## 2023-08-13 ENCOUNTER — Ambulatory Visit: Admitting: Podiatry

## 2023-09-09 ENCOUNTER — Ambulatory Visit
Admission: RE | Admit: 2023-09-09 | Discharge: 2023-09-09 | Disposition: A | Discharge status: Home / Self Care | Source: Ambulatory Visit | Attending: Nurse Practitioner | Admitting: Nurse Practitioner

## 2023-09-09 ENCOUNTER — Other Ambulatory Visit: Payer: Self-pay | Admitting: Nurse Practitioner

## 2023-09-09 DIAGNOSIS — M5382 Other specified dorsopathies, cervical region: Secondary | ICD-10-CM

## 2023-09-09 DIAGNOSIS — W19XXXA Unspecified fall, initial encounter: Secondary | ICD-10-CM

## 2023-09-27 ENCOUNTER — Encounter (HOSPITAL_BASED_OUTPATIENT_CLINIC_OR_DEPARTMENT_OTHER): Attending: Internal Medicine | Admitting: Internal Medicine

## 2023-10-14 ENCOUNTER — Encounter (HOSPITAL_BASED_OUTPATIENT_CLINIC_OR_DEPARTMENT_OTHER): Attending: Internal Medicine | Admitting: Internal Medicine

## 2023-10-14 DIAGNOSIS — I87313 Chronic venous hypertension (idiopathic) with ulcer of bilateral lower extremity: Secondary | ICD-10-CM | POA: Diagnosis not present

## 2023-10-14 DIAGNOSIS — N401 Enlarged prostate with lower urinary tract symptoms: Secondary | ICD-10-CM | POA: Diagnosis not present

## 2023-10-14 DIAGNOSIS — Z9079 Acquired absence of other genital organ(s): Secondary | ICD-10-CM | POA: Insufficient documentation

## 2023-10-14 DIAGNOSIS — I890 Lymphedema, not elsewhere classified: Secondary | ICD-10-CM | POA: Diagnosis present

## 2023-10-14 DIAGNOSIS — I87311 Chronic venous hypertension (idiopathic) with ulcer of right lower extremity: Secondary | ICD-10-CM

## 2023-10-14 DIAGNOSIS — I87312 Chronic venous hypertension (idiopathic) with ulcer of left lower extremity: Secondary | ICD-10-CM | POA: Diagnosis not present

## 2023-10-14 DIAGNOSIS — L97812 Non-pressure chronic ulcer of other part of right lower leg with fat layer exposed: Secondary | ICD-10-CM | POA: Insufficient documentation

## 2023-10-14 DIAGNOSIS — E11622 Type 2 diabetes mellitus with other skin ulcer: Secondary | ICD-10-CM | POA: Insufficient documentation

## 2023-10-14 DIAGNOSIS — E785 Hyperlipidemia, unspecified: Secondary | ICD-10-CM | POA: Insufficient documentation

## 2023-10-14 DIAGNOSIS — N138 Other obstructive and reflux uropathy: Secondary | ICD-10-CM | POA: Diagnosis not present

## 2023-10-14 DIAGNOSIS — L97822 Non-pressure chronic ulcer of other part of left lower leg with fat layer exposed: Secondary | ICD-10-CM | POA: Insufficient documentation

## 2023-10-14 DIAGNOSIS — I10 Essential (primary) hypertension: Secondary | ICD-10-CM | POA: Insufficient documentation

## 2023-10-14 DIAGNOSIS — Z7984 Long term (current) use of oral hypoglycemic drugs: Secondary | ICD-10-CM | POA: Diagnosis not present

## 2023-10-14 DIAGNOSIS — Z981 Arthrodesis status: Secondary | ICD-10-CM | POA: Diagnosis not present

## 2023-10-19 ENCOUNTER — Ambulatory Visit (INDEPENDENT_AMBULATORY_CARE_PROVIDER_SITE_OTHER): Admitting: Podiatry

## 2023-10-19 ENCOUNTER — Encounter: Payer: Self-pay | Admitting: Podiatry

## 2023-10-19 DIAGNOSIS — M2141 Flat foot [pes planus] (acquired), right foot: Secondary | ICD-10-CM | POA: Diagnosis not present

## 2023-10-19 DIAGNOSIS — M79674 Pain in right toe(s): Secondary | ICD-10-CM

## 2023-10-19 DIAGNOSIS — Z0189 Encounter for other specified special examinations: Secondary | ICD-10-CM

## 2023-10-19 DIAGNOSIS — B353 Tinea pedis: Secondary | ICD-10-CM | POA: Diagnosis not present

## 2023-10-19 DIAGNOSIS — E1151 Type 2 diabetes mellitus with diabetic peripheral angiopathy without gangrene: Secondary | ICD-10-CM

## 2023-10-19 DIAGNOSIS — B351 Tinea unguium: Secondary | ICD-10-CM

## 2023-10-19 DIAGNOSIS — M79675 Pain in left toe(s): Secondary | ICD-10-CM | POA: Diagnosis not present

## 2023-10-19 DIAGNOSIS — E119 Type 2 diabetes mellitus without complications: Secondary | ICD-10-CM

## 2023-10-19 DIAGNOSIS — M2142 Flat foot [pes planus] (acquired), left foot: Secondary | ICD-10-CM

## 2023-10-19 MED ORDER — KETOCONAZOLE 2 % EX CREA: TOPICAL_CREAM | CUTANEOUS | 1 refills | Status: AC

## 2023-10-21 ENCOUNTER — Encounter (HOSPITAL_BASED_OUTPATIENT_CLINIC_OR_DEPARTMENT_OTHER): Admitting: Internal Medicine

## 2023-10-21 DIAGNOSIS — I890 Lymphedema, not elsewhere classified: Secondary | ICD-10-CM | POA: Diagnosis not present

## 2023-10-21 DIAGNOSIS — I87312 Chronic venous hypertension (idiopathic) with ulcer of left lower extremity: Secondary | ICD-10-CM

## 2023-10-21 DIAGNOSIS — L97822 Non-pressure chronic ulcer of other part of left lower leg with fat layer exposed: Secondary | ICD-10-CM | POA: Diagnosis not present

## 2023-10-21 DIAGNOSIS — I87311 Chronic venous hypertension (idiopathic) with ulcer of right lower extremity: Secondary | ICD-10-CM | POA: Diagnosis not present

## 2023-10-21 DIAGNOSIS — L97812 Non-pressure chronic ulcer of other part of right lower leg with fat layer exposed: Secondary | ICD-10-CM | POA: Diagnosis not present

## 2023-10-24 NOTE — Progress Notes (Signed)
 Subjective:  Patient ID: Dale Key, adult    DOB: June 15, 1955,  MRN: [id number]  Dale Key presents to clinic today for for annual diabetic foot examination, at risk foot care. Pt has h/o NIDDM with PAD, and painful mycotic toenails of both feet that are difficult to trim. Pain interferes with daily activities and wearing enclosed shoe gear comfortably. He is seeing wound care service for b/l LE venous stasis ulcerations. Chief Complaint  Patient presents with   Diabetes    Patient stated that his last A1c was 6.5 and he last saw his PCP 2 weeks ago, Patient stated that his PCP name is Dale Key.   New problem(s): None.   PCP is Key Daphne, NP.  Allergies  Allergen Reactions   Sulfamethoxazole-Trimethoprim Other (See Comments) and Diarrhea   Other Nausea Only    Review of Systems: Negative except as noted in the HPI.  Objective: No changes noted in today's physical examination. There were no vitals filed for this visit. Dale Key is a pleasant 68 y.o. adult obese in NAD. AAO x 3.  Vascular Examination: CFT <3 seconds b/l LE. Diminished DP pulse(s) b/l LE. Diminished PT pulse(s) b/l LE. Pedal hair absent. No pain with calf compression b/l. Lower extremity skin temperature gradient within normal limits. Wearing bilateral LE wraps with toes exposed. No ischemia or gangrene noted b/l LE. No cyanosis or clubbing noted b/l LE.  Dermatological Examination: Toenails 1-5 b/l elongated, discolored, dystrophic, thickened, crumbly with subungual debris and tenderness to dorsal palpation.   Pedal skin noted to exhibit signs of poor pedal hygiene with noted foot odor b/l and interdigital  and dorsal scaly debris. No open wounds noted.  Minimal hyperkeratotic lesion noted  distal tip L 3rd toe. There is visible subdermal hemorrhage. There is no surrounding erythema, no edema, no drainage, no odor, no fluctuance.   Hyperpigmentation consistent with findings of chronic venous  insufficiency is present b/l lower extremities. Pedal skin noted to be dry b/l lower extremities.  Neurological Examination: Protective sensation intact 5/5 intact bilaterally with 10g monofilament b/l. Vibratory sensation intact b/l.  Musculoskeletal Examination: Normal muscle strength 5/5 to all lower extremity muscle groups bilaterally. Hammertoe deformity noted 2-5 b/l.SABRA No pain, crepitus or joint limitation noted with ROM b/l LE.  Patient ambulates independently without assistive aids.   Assessment/Plan: 1. Pain due to onychomycosis of toenails of both feet   2. Tinea pedis of both feet   3. Pes planus of both feet   4. Type II diabetes mellitus with peripheral circulatory disorder (HCC)   5. Encounter for diabetic foot exam (HCC)     Meds ordered this encounter  Medications   ketoconazole  (NIZORAL ) 2 % cream    Sig: Apply to both feet and between toes once daily.    Dispense:  60 g    Refill:  1  -Patient was evaluated today. All questions/concerns addressed on today's visit. -Diabetic foot examination performed today. -Under care of Wound Care clinic for venous stasis ulcerations b/l. -Continue diabetic foot care principles: inspect feet daily, monitor glucose as recommended by PCP and/or Endocrinologist, and follow prescribed diet per PCP, Endocrinologist and/or dietician. -Toenails 1-5 b/l were debrided in length and girth with sterile nail nippers and dremel without iatrogenic bleeding.  -For tinea pedis, Rx sent to pharmacy for Ketoconazole  Cream 2% to be applied once daily. -Patient/POA to call should there be question/concern in the interim.   Return in about 3 months (around 01/19/2024).  Dale Key, DPM  [place] LOCATION: [address] [place] [address]                   Office [phone]   [facility] LOCATION: [address] [place], [place] [address] Office [phone]

## 2023-10-28 ENCOUNTER — Encounter (HOSPITAL_BASED_OUTPATIENT_CLINIC_OR_DEPARTMENT_OTHER): Admitting: Internal Medicine

## 2023-10-28 DIAGNOSIS — I890 Lymphedema, not elsewhere classified: Secondary | ICD-10-CM

## 2023-10-28 DIAGNOSIS — I87312 Chronic venous hypertension (idiopathic) with ulcer of left lower extremity: Secondary | ICD-10-CM | POA: Diagnosis not present

## 2023-10-28 DIAGNOSIS — L97812 Non-pressure chronic ulcer of other part of right lower leg with fat layer exposed: Secondary | ICD-10-CM | POA: Diagnosis not present

## 2023-10-28 DIAGNOSIS — I87311 Chronic venous hypertension (idiopathic) with ulcer of right lower extremity: Secondary | ICD-10-CM

## 2023-10-28 DIAGNOSIS — L97822 Non-pressure chronic ulcer of other part of left lower leg with fat layer exposed: Secondary | ICD-10-CM

## 2023-11-03 ENCOUNTER — Encounter (HOSPITAL_BASED_OUTPATIENT_CLINIC_OR_DEPARTMENT_OTHER): Admitting: Internal Medicine

## 2023-11-04 ENCOUNTER — Encounter (HOSPITAL_BASED_OUTPATIENT_CLINIC_OR_DEPARTMENT_OTHER): Admitting: Internal Medicine

## 2023-11-05 ENCOUNTER — Encounter (HOSPITAL_BASED_OUTPATIENT_CLINIC_OR_DEPARTMENT_OTHER): Admitting: Internal Medicine

## 2023-11-05 DIAGNOSIS — I890 Lymphedema, not elsewhere classified: Secondary | ICD-10-CM | POA: Diagnosis not present

## 2023-11-05 DIAGNOSIS — I87312 Chronic venous hypertension (idiopathic) with ulcer of left lower extremity: Secondary | ICD-10-CM | POA: Diagnosis not present

## 2023-11-05 DIAGNOSIS — L97822 Non-pressure chronic ulcer of other part of left lower leg with fat layer exposed: Secondary | ICD-10-CM | POA: Diagnosis not present

## 2023-11-09 ENCOUNTER — Other Ambulatory Visit: Payer: Self-pay

## 2023-11-09 DIAGNOSIS — I739 Peripheral vascular disease, unspecified: Secondary | ICD-10-CM

## 2023-12-08 ENCOUNTER — Ambulatory Visit (HOSPITAL_COMMUNITY)

## 2023-12-08 ENCOUNTER — Ambulatory Visit (HOSPITAL_COMMUNITY): Attending: Vascular Surgery | Admitting: Vascular Surgery

## 2023-12-22 ENCOUNTER — Ambulatory Visit: Admitting: Vascular Surgery

## 2023-12-22 ENCOUNTER — Encounter: Payer: Self-pay | Admitting: Vascular Surgery

## 2023-12-22 ENCOUNTER — Ambulatory Visit (HOSPITAL_COMMUNITY): Admission: RE | Admit: 2023-12-22 | Discharge: 2023-12-22 | Attending: Vascular Surgery

## 2023-12-22 VITALS — BP 125/76 | HR 68 | Temp 98.1°F

## 2023-12-22 DIAGNOSIS — I739 Peripheral vascular disease, unspecified: Secondary | ICD-10-CM | POA: Diagnosis present

## 2023-12-22 DIAGNOSIS — I890 Lymphedema, not elsewhere classified: Secondary | ICD-10-CM | POA: Insufficient documentation

## 2023-12-22 LAB — VAS US ABI WITH/WO TBI
Left ABI: 1.1
Right ABI: 1.15

## 2023-12-22 NOTE — Progress Notes (Signed)
 Patient ID: Dale Key, adult   DOB: Oct 18, 1955, 68 y.o.   MRN: [id number]  Reason for Consult: New Patient (Initial Visit)   Referred by Arloa Jarvis, NP  Subjective:     HPI:  Dale Key is a 68 y.o. adult without significant vascular history but does have extensive previous history of cervical spine trauma and intervention now on disability.  Previously was a curator.  He is a diabetic with hyperlipidemia and hypertension.  He is a former smoker.  Currently takes aspirin and statin.  He walks with the help of a cane and has significant lower extremity edema and has been evaluated for lymphedema in the past.  He recently had ulcers on his legs but these have healed.  He does wear compression socks when he can tolerate them.  Past Medical History:  Diagnosis Date   Diabetes mellitus without complication (HCC)    Glaucoma    Hyperlipidemia    Hypertension    Tracheostomy present (HCC)    History reviewed. No pertinent family history. Past Surgical History:  Procedure Laterality Date   ANTERIOR CERVICAL DECOMP/DISCECTOMY FUSION N/A 10/07/2012   Procedure: ANTERIOR CERVICAL DECOMPRESSION/DISCECTOMY FUSION 2 LEVELS;  Surgeon: Catalina CHRISTELLA Stains, MD;  Location: [facility] OR;  Service: Neurosurgery;  Laterality: N/A;  Anterior Cervical Three-Four/Four-Five Decompression and Fusion   EYE SURGERY     FOOT SURGERY Left    Pt states a tendon was removed to replace one in right hand.   HAND SURGERY Right    TRACHEOSTOMY      Short Social History:  Social History   Tobacco Use   Smoking status: Former    Current packs/day: 0.00    Types: Cigarettes    Quit date: 01/13/2012    Years since quitting: 11.9   Smokeless tobacco: Never  Substance Use Topics   Alcohol use: Yes    Allergies  Allergen Reactions   Sulfamethoxazole-Trimethoprim Other (See Comments) and Diarrhea   Other Nausea Only    Current Outpatient Medications  Medication Sig Dispense Refill   ACCU-CHEK GUIDE test  strip USE 1 STRIP TO CHECK BLOOD GLUCOSE 2 TIMES DAILY AS INSTRUCTED     albuterol  (PROAIR  HFA) 108 (90 Base) MCG/ACT inhaler INHALE 2 PUFFS BY MOUTH EVERY 6 HOURS AS NEEDED FOR WHEEZING     allopurinol (ZYLOPRIM) 300 MG tablet Take by mouth.     aspirin 81 MG tablet Take 81 mg by mouth daily.     Blood Glucose Monitoring Suppl (ACCU-CHEK GUIDE) w/Device KIT USE 1 AS DIRECTED     brimonidine (ALPHAGAN) 0.2 % ophthalmic solution Apply to eye.     brimonidine (ALPHAGAN) 0.2 % ophthalmic solution Place 1 drop into the right eye 3 (three) times daily.     Budeson-Glycopyrrol-Formoterol (BREZTRI AEROSPHERE IN) Inhale into the lungs.     Cholecalciferol (VITAMIN D-1000 MAX ST) 1000 units tablet Take 1,000 Units by mouth daily.     dorzolamide-timolol (COSOPT) 22.3-6.8 MG/ML ophthalmic solution Apply to eye.     doxycycline (VIBRA-TABS) 100 MG tablet Take 100 mg by mouth 2 (two) times daily.     FARXIGA 5 MG TABS tablet Take 5 mg by mouth daily.     finasteride (PROSCAR) 5 MG tablet Take 1 tablet by mouth daily.     fluticasone (FLONASE) 50 MCG/ACT nasal spray      fluticasone furoate-vilanterol (BREO ELLIPTA) 100-25 MCG/INH AEPB Inhale into the lungs.     fosfomycin (MONUROL) 3 g PACK Take by mouth.  furosemide  (LASIX ) 40 MG tablet Take by mouth.     ketoconazole  (NIZORAL ) 2 % cream Apply to both feet and between toes once daily. 60 g 1   latanoprost  (XALATAN ) 0.005 % ophthalmic solution 1 drop at bedtime.     linaclotide (LINZESS) 145 MCG CAPS capsule Take 145 mcg by mouth daily.     losartan  (COZAAR ) 50 MG tablet Take by mouth.     meloxicam (MOBIC) 15 MG tablet Take 15 mg by mouth daily.     meloxicam (MOBIC) 7.5 MG tablet Take 7.5 mg by mouth daily.     metFORMIN  (GLUCOPHAGE ) 500 MG tablet Take by mouth.     metoprolol  succinate (TOPROL -XL) 50 MG 24 hr tablet Take 1 tablet (50 mg total) by mouth every morning. Take with or immediately following a meal. 30 tablet 1   Multiple Vitamin  (MULTI-VITAMINS) TABS Take by mouth.     nystatin powder Apply 1 Application topically 2 (two) times daily.     Omega-3 Fatty Acids (OMEGA-3 2100) 1050 MG CAPS Take by mouth.     polyethylene glycol powder (GLYCOLAX /MIRALAX ) 17 GM/SCOOP powder Take by mouth.     potassium chloride  SA (K-DUR,KLOR-CON ) 20 MEQ tablet Take 1 tablet (20 mEq total) by mouth daily as needed (For potassium loss.). 30 tablet 0   rosuvastatin (CRESTOR) 10 MG tablet Take 10 mg by mouth daily.     Semaglutide,0.25 or 0.5MG /DOS, 2 MG/1.5ML SOPN Inject into the skin.     sitaGLIPtin (JANUVIA) 50 MG tablet Take 50 mg by mouth daily.     torsemide (DEMADEX) 20 MG tablet Take 20 mg by mouth 2 (two) times daily.     vitamin C (ASCORBIC ACID) 500 MG tablet Take by mouth.     No current facility-administered medications for this visit.   Review of Systems  HENT: Negative.    Respiratory: Negative.    Cardiovascular:  Positive for leg swelling.  Gastrointestinal: Negative.   Musculoskeletal: Negative.   Skin: Negative.   Neurological: Negative.   Endo/Heme/Allergies: Negative.   Psychiatric/Behavioral: Negative.           Objective:  Objective   Vitals:   12/22/23 1440  BP: 125/76  Pulse: 68  Temp: 98.1 F (36.7 C)  SpO2: 92%   There is no height or weight on file to calculate BMI.  Physical Exam HENT:     Head: Normocephalic.     Mouth/Throat:     Mouth: Mucous membranes are moist.  Eyes:     Pupils: Pupils are equal, round, and reactive to light.  Cardiovascular:     Pulses:          Radial pulses are 2+ on the right side and 2+ on the left side.     Comments: Pulse exam precluded by significant lower extremity edema Pulmonary:     Effort: Pulmonary effort is normal.  Abdominal:     General: Abdomen is flat.  Musculoskeletal:     Right lower leg: Edema present.     Left lower leg: Edema present.  Skin:    Capillary Refill: Capillary refill takes less than 2 seconds.  Neurological:      General: No focal deficit present.     Mental Status: He is alert.      Data: BI Findings:  +---------+------------------+-----+-----------+--------+  Right   Rt Pressure (mmHg)IndexWaveform   Comment   +---------+------------------+-----+-----------+--------+  Brachial 136                                         +---------+------------------+-----+-----------+--------+  PTA     154               1.13 multiphasic          +---------+------------------+-----+-----------+--------+  DP      156               1.15 multiphasic          +---------+------------------+-----+-----------+--------+  Great Toe85                0.62 Abnormal             +---------+------------------+-----+-----------+--------+   +---------+------------------+-----+---------+-------+  Left    Lt Pressure (mmHg)IndexWaveform Comment  +---------+------------------+-----+---------+-------+  Brachial 136                                      +---------+------------------+-----+---------+-------+  PTA     149               1.10 biphasic          +---------+------------------+-----+---------+-------+  DP      150               1.10 triphasic         +---------+------------------+-----+---------+-------+  Great Toe104               0.76 Normal            +---------+------------------+-----+---------+-------+   +-------+-----------+-----------+------------+------------+  ABI/TBIToday's ABIToday's TBIPrevious ABIPrevious TBI  +-------+-----------+-----------+------------+------------+  Right 1.15       0.62                                 +-------+-----------+-----------+------------+------------+  Left  1.10       0.76                                 +-------+-----------+-----------+------------+------------+         Summary:  Right: Resting right ankle-brachial index is within normal range. The  right toe-brachial index is  abnormal.  Right toe pressure is >60 mmHg which suggests adequate perfusion for  healing.  Left: Resting left ankle-brachial index is within normal range. The left  toe-brachial index is abnormal.      Assessment/Plan:     68 year old male sent for ulcerations of his bilateral lower extremities which have healed.  Appears to have lymphedema and we have made referral to lymphedema clinic which patient will need to contact.  Have discussed the need for continued compression stockings in the interim.  He can follow-up with me on an as-needed basis.     Penne Lonni Colorado MD [facility]

## 2024-01-17 ENCOUNTER — Ambulatory Visit: Attending: Nurse Practitioner | Admitting: Physical Therapy

## 2024-01-17 NOTE — Therapy (Incomplete)
 " OUTPATIENT PHYSICAL THERAPY NEURO EVALUATION   Patient Name: Dale Key MRN: [id number] DOB:Sep 15, 1955, 69 y.o., adult Today's Date: 01/17/2024   PCP: Arloa Jarvis, NP  REFERRING PROVIDER: Arloa Jarvis, NP   END OF SESSION:   Past Medical History:  Diagnosis Date   Diabetes mellitus without complication (HCC)    Glaucoma    Hyperlipidemia    Hypertension    Tracheostomy present [facility])    Past Surgical History:  Procedure Laterality Date   ANTERIOR CERVICAL DECOMP/DISCECTOMY FUSION N/A 10/07/2012   Procedure: ANTERIOR CERVICAL DECOMPRESSION/DISCECTOMY FUSION 2 LEVELS;  Surgeon: Catalina CHRISTELLA Stains, MD;  Location: [facility] OR;  Service: Neurosurgery;  Laterality: N/A;  Anterior Cervical Three-Four/Four-Five Decompression and Fusion   EYE SURGERY     FOOT SURGERY Left    Pt states a tendon was removed to replace one in right hand.   HAND SURGERY Right    TRACHEOSTOMY     Patient Active Problem List   Diagnosis Date Noted   Age-related nuclear cataract of both eyes 10/01/2021   Primary open angle glaucoma of left eye, mild stage 10/01/2021   Abnormal weight loss 08/05/2020   Chronic idiopathic constipation 08/05/2020   Colon cancer screening 08/05/2020   Gastroesophageal reflux disease 08/05/2020   Low back pain 08/05/2020   Morbid obesity (HCC) 08/05/2020   History of colonic polyps 08/05/2020   Dyspnea on effort 07/30/2020   Thyroid  nodule 11/13/2019   Lymphadenopathy 11/06/2019   Glaucoma of right eye 05/11/2019   Moderate nonproliferative diabetic retinopathy 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 edema associated with type 2 diabetes mellitus (HCC) 05/11/2019   Nuclear sclerotic cataract of both eyes 05/11/2019   OSA on CPAP 05/08/2019   Poor mobility 06/24/2018   Urinary tract infection due to ESBL Klebsiella 06/08/2018   Recurrent UTI 05/10/2018   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/10/2018   BMI 40.0-44.9, adult (HCC) 11/25/2017   Cervical disc disease with  myelopathy 11/15/2017   Anemia 10/20/2017   Benign essential hypertension 10/20/2017   Constipation 10/20/2017   Controlled type 2 diabetes mellitus with hyperglycemia, without long-term current use of insulin  (HCC) 10/20/2017   Generalized osteoarthritis of multiple sites 10/20/2017   Hoarseness or changing voice 10/20/2017   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   Upper extremity weakness 06/16/2017   Left arm weakness 06/16/2017   S/P cervical spinal fusion 04/29/2017   Bilateral lower extremity edema 01/09/2017   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   Bilateral vocal cord paralysis 12/22/2015   Vocal cord paralysis, unilateral complete 12/22/2015   Oropharyngeal dysphagia 12/02/2015   Low vitamin D level 11/19/2015   Elevated troponin level 11/19/2015   Hypertension 11/14/2015   Type 2 diabetes mellitus, without long-term current use of insulin  (HCC) 11/14/2015   Type 2 diabetes mellitus (HCC) 11/14/2015   BPH (benign prostatic hyperplasia) 06/14/2014   Epididymitis, right 05/17/2014   Hematuria 05/17/2014   Swollen testicle 05/17/2014   Testis mass 05/17/2014   Gross hematuria 10/19/2013   Central cord syndrome (HCC) 10/13/2012    ONSET DATE: 12/16/2023 (MD referral)  REFERRING DIAG: R26.81 (ICD-10-CM) - Unsteadiness on feet   THERAPY DIAG:  No diagnosis found.  Rationale for Evaluation and Treatment: Rehabilitation  SUBJECTIVE:  SUBJECTIVE STATEMENT: *** Pt accompanied by: {accompnied:27141}  PERTINENT HISTORY: UTI, CHF, COPD, GERD, HLD, HTN, OSA on CPAP, lymphedema, venous ulcer BLEs, PAD, DM with neuropathy, hx of fall  PAIN:  Are you having pain? {OPRCPAIN:27236}  PRECAUTIONS: {Therapy precautions:24002}  RED FLAGS: {PT Red  Flags:29287}   WEIGHT BEARING RESTRICTIONS: {Yes ***/No:24003}  FALLS: Has patient fallen in last 6 months? {fallsyesno:27318}  LIVING ENVIRONMENT: Lives with: {OPRC lives with:25569::lives with their family} Lives in: {Lives in:25570} Stairs: {opstairs:27293} Has following equipment at home: {Assistive devices:23999}  PLOF: {PLOF:24004}  PATIENT GOALS: ***  OBJECTIVE:  Note: Objective measures were completed at Evaluation unless otherwise noted.  DIAGNOSTIC FINDINGS: ***  COGNITION: Overall cognitive status: {cognition:24006}   SENSATION: {sensation:27233}  COORDINATION: ***  EDEMA:  {edema:24020}  MUSCLE TONE: {LE tone:25568}  MUSCLE LENGTH: Hamstrings: Right *** deg; Left *** deg Debby test: Right *** deg; Left *** deg  DTRs:  {DTR SITE:24025}  POSTURE: {posture:25561}  LOWER EXTREMITY ROM:     {AROM/PROM:27142}  Right Eval Left Eval  Hip flexion    Hip extension    Hip abduction    Hip adduction    Hip internal rotation    Hip external rotation    Knee flexion    Knee extension    Ankle dorsiflexion    Ankle plantarflexion    Ankle inversion    Ankle eversion     (Blank rows = not tested)  LOWER EXTREMITY MMT:    MMT Right Eval Left Eval  Hip flexion    Hip extension    Hip abduction    Hip adduction    Hip internal rotation    Hip external rotation    Knee flexion    Knee extension    Ankle dorsiflexion    Ankle plantarflexion    Ankle inversion    Ankle eversion    (Blank rows = not tested)  BED MOBILITY:  {bed mobility:32615:p}  TRANSFERS: {transfers eval:32620}  STAIRS: {stairs eval:32618} GAIT: Findings: {GaitneuroPT:32644::Distance walked: ***,Comments: ***}  FUNCTIONAL TESTS:  {Functional tests:24029}  PATIENT SURVEYS:  {rehab surveys:24030}                                                                                                                              TREATMENT DATE: 01/17/2024     PATIENT EDUCATION: Education details: PT eval results, POC*** Person educated: {Person educated:25204} Education method: {Education Method:25205} Education comprehension: {Education Comprehension:25206}  HOME EXERCISE PROGRAM: ***  GOALS: Goals reviewed with patient? {yes/no:20286}  SHORT TERM GOALS: Target date: ***  *** Baseline: Goal status: INITIAL  2.  *** Baseline:  Goal status: INITIAL  3.  *** Baseline:  Goal status: INITIAL  4.  *** Baseline:  Goal status: INITIAL  5.  *** Baseline:  Goal status: INITIAL  6.  *** Baseline:  Goal status: INITIAL  LONG TERM GOALS: Target date: ***  Pt will be *** with HEP for improved ***. Baseline:  Goal status:  INITIAL  2.  *** Baseline:  Goal status: INITIAL  3.  *** Baseline:  Goal status: INITIAL  4.  *** Baseline:  Goal status: INITIAL  5.  *** Baseline:  Goal status: INITIAL  6.  *** Baseline:  Goal status: INITIAL  ASSESSMENT:  CLINICAL IMPRESSION: Patient is a 69 y.o. male who was seen today for physical therapy evaluation and treatment for unsteadiness on feet. ***  OBJECTIVE IMPAIRMENTS: {opptimpairments:25111}.   ACTIVITY LIMITATIONS: {activitylimitations:27494}  PARTICIPATION LIMITATIONS: {participationrestrictions:25113}  PERSONAL FACTORS: {Personal factors:25162} are also affecting patient's functional outcome.   REHAB POTENTIAL: {rehabpotential:25112}  CLINICAL DECISION MAKING: {clinical decision making:25114}  EVALUATION COMPLEXITY: {Evaluation complexity:25115}  PLAN:  PT FREQUENCY: {rehab frequency:25116}  PT DURATION: {rehab duration:25117}  PLANNED INTERVENTIONS: {rehab planned interventions:25118::97110-Therapeutic exercises,97530- Therapeutic [phone]- Neuromuscular re-education,97535- Self Rjmz,02859- Manual therapy,Patient/Family education}  PLAN FOR NEXT SESSION: PIERRETTE STARLET GREIG LELON., PT 01/17/2024, 12:15 PM  [facility] [address] [place], [place] [address] Phone # [phone] Fax # [phone]       "

## 2024-01-18 ENCOUNTER — Ambulatory Visit

## 2024-01-31 NOTE — Therapy (Incomplete)
 " OUTPATIENT PHYSICAL THERAPY NEURO EVALUATION   Patient Name: Dale Key MRN: [id number] DOB:1955/12/22, 69 y.o., adult Today's Date: 01/31/2024   PCP: Arloa Jarvis, NP  REFERRING PROVIDER: Arloa Jarvis, NP   END OF SESSION:   Past Medical History:  Diagnosis Date   Diabetes mellitus without complication (HCC)    Glaucoma    Hyperlipidemia    Hypertension    Tracheostomy present [facility])    Past Surgical History:  Procedure Laterality Date   ANTERIOR CERVICAL DECOMP/DISCECTOMY FUSION N/A 10/07/2012   Procedure: ANTERIOR CERVICAL DECOMPRESSION/DISCECTOMY FUSION 2 LEVELS;  Surgeon: Catalina CHRISTELLA Stains, MD;  Location: [facility] OR;  Service: Neurosurgery;  Laterality: N/A;  Anterior Cervical Three-Four/Four-Five Decompression and Fusion   EYE SURGERY     FOOT SURGERY Left    Pt states a tendon was removed to replace one in right hand.   HAND SURGERY Right    TRACHEOSTOMY     Patient Active Problem List   Diagnosis Date Noted   Age-related nuclear cataract of both eyes 10/01/2021   Primary open angle glaucoma of left eye, mild stage 10/01/2021   Abnormal weight loss 08/05/2020   Chronic idiopathic constipation 08/05/2020   Colon cancer screening 08/05/2020   Gastroesophageal reflux disease 08/05/2020   Low back pain 08/05/2020   Morbid obesity (HCC) 08/05/2020   History of colonic polyps 08/05/2020   Dyspnea on effort 07/30/2020   Thyroid  nodule 11/13/2019   Lymphadenopathy 11/06/2019   Glaucoma of right eye 05/11/2019   Moderate nonproliferative diabetic retinopathy 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 edema associated with type 2 diabetes mellitus (HCC) 05/11/2019   Nuclear sclerotic cataract of both eyes 05/11/2019   OSA on CPAP 05/08/2019   Poor mobility 06/24/2018   Urinary tract infection due to ESBL Klebsiella 06/08/2018   Recurrent UTI 05/10/2018   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/10/2018   BMI 40.0-44.9, adult (HCC) 11/25/2017   Cervical disc disease with  myelopathy 11/15/2017   Anemia 10/20/2017   Benign essential hypertension 10/20/2017   Constipation 10/20/2017   Controlled type 2 diabetes mellitus with hyperglycemia, without long-term current use of insulin  (HCC) 10/20/2017   Generalized osteoarthritis of multiple sites 10/20/2017   Hoarseness or changing voice 10/20/2017   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   Upper extremity weakness 06/16/2017   Left arm weakness 06/16/2017   S/P cervical spinal fusion 04/29/2017   Bilateral lower extremity edema 01/09/2017   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   Bilateral vocal cord paralysis 12/22/2015   Vocal cord paralysis, unilateral complete 12/22/2015   Oropharyngeal dysphagia 12/02/2015   Low vitamin D level 11/19/2015   Elevated troponin level 11/19/2015   Hypertension 11/14/2015   Type 2 diabetes mellitus, without long-term current use of insulin  (HCC) 11/14/2015   Type 2 diabetes mellitus (HCC) 11/14/2015   BPH (benign prostatic hyperplasia) 06/14/2014   Epididymitis, right 05/17/2014   Hematuria 05/17/2014   Swollen testicle 05/17/2014   Testis mass 05/17/2014   Gross hematuria 10/19/2013   Central cord syndrome (HCC) 10/13/2012    ONSET DATE: ***  REFERRING DIAG: R26.81 (ICD-10-CM) - Unsteadiness on feet  THERAPY DIAG:  No diagnosis found.  Rationale for Evaluation and Treatment: Rehabilitation  SUBJECTIVE:  SUBJECTIVE STATEMENT: *** Pt accompanied by: {accompnied:27141}  PERTINENT HISTORY: DM, HLD, HTN, ACDF C3-5, cervical posterior decompression C3-7, Bilateral vocal cord paralysis, COPD/asthma  PAIN:  Are you having pain? {OPRCPAIN:27236}  PRECAUTIONS: {Therapy precautions:24002}  RED FLAGS: {PT Red Flags:29287}   WEIGHT BEARING  RESTRICTIONS: {Yes ***/No:24003}  FALLS: Has patient fallen in last 6 months? {fallsyesno:27318}  LIVING ENVIRONMENT: Lives with: {OPRC lives with:25569::lives with their family} Lives in: {Lives in:25570} Stairs: {opstairs:27293} Has following equipment at home: {Assistive devices:23999}  PLOF: {PLOF:24004}  PATIENT GOALS: ***  OBJECTIVE:  Note: Objective measures were completed at Evaluation unless otherwise noted.  DIAGNOSTIC FINDINGS: none recent  COGNITION: Overall cognitive status: {cognition:24006}   SENSATION: {sensation:27233}  COORDINATION: Alternating pronation/supination: *** Alternating toe tap: *** Finger to nose: *** Heel to shin: ***   MUSCLE TONE: {LE tone:25568}   POSTURE: {posture:25561}  LOWER EXTREMITY ROM:     Active  Right Eval Left Eval  Hip flexion    Hip extension    Hip abduction    Hip adduction    Hip internal rotation    Hip external rotation    Knee flexion    Knee extension    Ankle dorsiflexion    Ankle plantarflexion    Ankle inversion    Ankle eversion     (Blank rows = not tested)  LOWER EXTREMITY MMT:    MMT Right Eval Left Eval  Hip flexion    Hip extension    Hip abduction    Hip adduction    Hip internal rotation    Hip external rotation    Knee flexion    Knee extension    Ankle dorsiflexion    Ankle plantarflexion    Ankle inversion    Ankle eversion    (Blank rows = not tested)  GAIT: Findings: Assistive device utilized:{Assistive devices:23999}, Level of assistance: {Levels of assistance:24026}, and Comments: ***  FUNCTIONAL TESTS:  {Functional tests:24029}  PATIENT SURVEYS:  {rehab surveys:24030}                                                                                                                              TREATMENT DATE: ***    PATIENT EDUCATION: Education details: *** Person educated: {Person educated:25204} Education method: {Education Method:25205} Education  comprehension: {Education Comprehension:25206}  HOME EXERCISE PROGRAM: ***  GOALS: Goals reviewed with patient? {yes/no:20286}  SHORT TERM GOALS: Target date: {follow up:25551}  Patient to be independent with initial HEP. Baseline: HEP initiated Goal status: {GOALSTATUS:25110}    LONG TERM GOALS: Target date: {follow up:25551}  Patient to be independent with advanced HEP. Baseline: Not yet initiated  Goal status: {GOALSTATUS:25110}  Patient to demonstrate B LE strength >/=4+/5.  Baseline: See above Goal status: {GOALSTATUS:25110}  Patient to demonstrate *** ROM WFL and without pain limiting.  Baseline: *** Goal status: {GOALSTATUS:25110}  Patient to report and demonstrate improved head, neck, and shoulder posture at rest and with activity.  Baseline: *** Goal status: {  GOALSTATUS:25110}  Patient to demonstrate alternating reciprocal pattern when ascending and descending stairs with good stability and 1 handrail as needed.   Baseline: Unable Goal status: {GOALSTATUS:25110}  Patient to score at least 20/24 on DGI in order to decrease risk of falls.  Baseline: *** Goal status: {GOALSTATUS:25110}  Patient to complete TUG in <14 sec with LRAD in order to decrease risk of falls.   Baseline: *** Goal status: {GOALSTATUS:25110}  Patient to demonstrate 5xSTS test in <15 sec in order to decrease risk of falls.  Baseline: *** Goal status: {GOALSTATUS:25110}  Patient to score at least ***/56 on Berg in order to decrease risk of falls.  Baseline: *** Goal status: {GOALSTATUS:25110}  Patient to score at least *** on FOTO in order to indicate improved functional outcomes.  Baseline: *** Goal status: {GOALSTATUS:25110}  ASSESSMENT:  CLINICAL IMPRESSION:  Patient is a 69 y/o M presenting to OPPT with c/o *** for the past ***  Patient today presenting with ***.    Patient was educated on gentle *** HEP and reported understanding. Prior to current episode, patient was  independent. Would benefit from skilled PT services *** x/week for *** weeks to address aforementioned impairments in order to optimize level of function.    OBJECTIVE IMPAIRMENTS: {opptimpairments:25111}.   ACTIVITY LIMITATIONS: {activitylimitations:27494}  PARTICIPATION LIMITATIONS: {participationrestrictions:25113}  PERSONAL FACTORS: {Personal factors:25162} are also affecting patient's functional outcome.   REHAB POTENTIAL: {rehabpotential:25112}  CLINICAL DECISION MAKING: {clinical decision making:25114}  EVALUATION COMPLEXITY: {Evaluation complexity:25115}  PLAN:  PT FREQUENCY: {rehab frequency:25116}  PT DURATION: {rehab duration:25117}  PLANNED INTERVENTIONS: {rehab planned interventions:25118::97110-Therapeutic exercises,97530- Therapeutic [phone]- Neuromuscular re-education,97535- Self Rjmz,02859- Manual therapy,Patient/Family education}  PLAN FOR NEXT SESSION: PIERRETTE Slater MARLA Campbell, PT 01/31/2024, 8:23 AM        "

## 2024-02-01 ENCOUNTER — Ambulatory Visit: Admitting: Physical Therapy

## 2024-02-01 ENCOUNTER — Ambulatory Visit: Admitting: Podiatry

## 2024-02-01 DIAGNOSIS — Z91199 Patient's noncompliance with other medical treatment and regimen due to unspecified reason: Secondary | ICD-10-CM

## 2024-02-01 NOTE — Progress Notes (Signed)
 1. No-show for appointment

## 2024-03-06 ENCOUNTER — Encounter (HOSPITAL_BASED_OUTPATIENT_CLINIC_OR_DEPARTMENT_OTHER): Admitting: Internal Medicine
